# Patient Record
Sex: Male | Born: 1940
Health system: Southern US, Community
[De-identification: ages and names within clinical notes are randomized; demographics above are authoritative.]

## PROBLEM LIST (undated history)

## (undated) DIAGNOSIS — E7800 Pure hypercholesterolemia, unspecified: Secondary | ICD-10-CM

## (undated) DIAGNOSIS — I7090 Unspecified atherosclerosis: Secondary | ICD-10-CM

## (undated) DIAGNOSIS — I10 Essential (primary) hypertension: Secondary | ICD-10-CM

## (undated) DIAGNOSIS — G4730 Sleep apnea, unspecified: Secondary | ICD-10-CM

## (undated) DIAGNOSIS — M353 Polymyalgia rheumatica: Secondary | ICD-10-CM

## (undated) DIAGNOSIS — F329 Major depressive disorder, single episode, unspecified: Secondary | ICD-10-CM

## (undated) DIAGNOSIS — J439 Emphysema, unspecified: Secondary | ICD-10-CM

## (undated) DIAGNOSIS — D509 Iron deficiency anemia, unspecified: Secondary | ICD-10-CM

## (undated) HISTORY — DX: Sleep apnea, unspecified: G47.30

## (undated) HISTORY — DX: Iron deficiency anemia, unspecified: D50.9

## (undated) HISTORY — DX: Emphysema, unspecified: J43.9

## (undated) HISTORY — DX: Major depressive disorder, single episode, unspecified: F32.9

## (undated) HISTORY — DX: Polymyalgia rheumatica: M35.3

## (undated) HISTORY — DX: Unspecified atherosclerosis: I70.90

## (undated) HISTORY — PX: OTHER SURGICAL HISTORY: SHX169

---

## 2007-07-06 ENCOUNTER — Encounter (INDEPENDENT_AMBULATORY_CARE_PROVIDER_SITE_OTHER): Payer: Self-pay | Admitting: Internal Medicine

## 2007-07-06 ENCOUNTER — Inpatient Hospital Stay (HOSPITAL_COMMUNITY): Admission: EM | Admit: 2007-07-06 | Discharge: 2007-07-09 | Payer: Self-pay | Admitting: Emergency Medicine

## 2007-07-07 ENCOUNTER — Encounter (INDEPENDENT_AMBULATORY_CARE_PROVIDER_SITE_OTHER): Payer: Self-pay | Admitting: Internal Medicine

## 2007-07-09 ENCOUNTER — Encounter (INDEPENDENT_AMBULATORY_CARE_PROVIDER_SITE_OTHER): Payer: Self-pay | Admitting: Neurology

## 2007-07-09 ENCOUNTER — Encounter: Payer: Self-pay | Admitting: Internal Medicine
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
## 2010-12-10 NOTE — Discharge Summary (Signed)
NAMEMALICK, NETZ                ACCOUNT NO.:  [id number]   MEDICAL RECORD NO.:  [id number]          PATIENT TYPE:  INP   LOCATION:  1513                         FACILITY:  [facility]   PHYSICIAN:  Lucita Ferrara, MD         DATE OF BIRTH:  02-Jan-1941   DATE OF ADMISSION:  07/05/2007  DATE OF DISCHARGE:  07/09/2007                               DISCHARGE SUMMARY   ADMISSION DIAGNOSES:  1. Dizziness.  2. Loss of balance.  3. Hyperlipidemia.  4. Hypertension.  5. History of cerebrovascular accident in October 2008.   DISCHARGE DIAGNOSES:  1. Transient ischemic attack with no new neurological deficits.  2. Hyperlipidemia, chronic, stable, controlled.  3. Hypertension, chronic, stable, controlled.  4. Sleep apnea, due to get CPAP, stable.   CONSULTANTS:  Include Neurology, Dr. Kelli Hope.   PRIMARY CARE PHYSICIAN:  Dr. Reuel Boom at [facility] at [facility].   PROCEDURES:  1. The patient had a CT of the head July 05, 2007, which showed      bilateral remote thalamic lacunar infarcts, chronic.  Had a MRA/MRI      of the brain.  MRA showed negative for intracranial MR angiography      of large and medial-size vessels.  MRI of the brain, both of them      done July 05, 2007, which showed old small-vessel strokes in the      cerebellum, thalami and hemispheric white matter.  Suspect two tiny      acute strokes, one of the right cerebellar peduncle and one of the      right mid-brain.  These are not definitive but possible.  2. The patient had a MRA of the neck, which was negative.  3. The patient also had transcranial Dopplers, which were also      negative.  4. The patient also had a 2D echocardiogram, which was normal with a      normal ejection fraction.  5. The patient also had a swallowing evaluation, which showed no      oropharyngeal dysphagia.   BRIEF HISTORY OF PRESENT ILLNESS:  Mr. Pooler is a 70 year old African-  American gentleman, presented to the [facility] on  July 05, 2007, with intermittent episodes of dizziness and loss of  balance, which has subsided, but given that he had a recent cerebellar  stroke about two months ago, the patient was admitted to the  neurological team, first at [facility], then he was  transferred to [facility] after being evaluated by  neurology above.  The patient underwent the following and above tests,  which were essentially negative for any new infarction.  His  neurological physical examination did not show any focal deficits.  Physical therapy, occupational therapy and comprehensive team were  called to evaluate, and other than that the patient was recommended to  undergo risk factor modification with control of cholesterol,  hypertension.  His cholesterol showed him to have an LDL of 75.  His  Zocor was increased from 10 to 20.  His laboratory  for TIA /stroke work-  up, including RPR and hypercoagulable profile were negative.  His  hemoglobin A1c for his control was 6.3, with no previous known diagnosis  of diabetes.   EXAMINATION:  GENERAL:  On the day of discharge, the patient was  hemodynamically stable.  VITAL SIGNS:  His blood pressure is 119/74, temperature 98.2, pulse 68,  pulse ox 96% on room air.  His glucose is 110/104/113.  HEENT:  Normocephalic, atraumatic, sclerae anicteric.  NECK:  Supple, no carotid bruits.  CARDIOVASCULAR:  S1, S2, regular rate and rhythm.  No murmurs, rubs or  clicks.  ABDOMEN:  Soft, nontender, nondistended.  Positive bowel sounds.  EXTREMITIES:  No clubbing, cyanosis or edema.   The patient is able to tolerate p.o. intake without difficulty, able to  ambulate without dizziness or fall.   PLAN:  The plan for discharge followup is:  1. To control risk factors including hypertension.  High blood sugars      need to be evaluated by primary care, and also he has a clinical      history of sleep apnea.  He was supposed to have an  appointment      actually today.  He may have rescheduled that appointment.      Lucita Ferrara, MD  Electronically Signed     RR/MEDQ  D:  07/09/2007  T:  07/09/2007  Job:  [id number]   cc:   Donzetta Sprung

## 2011-01-13 ENCOUNTER — Ambulatory Visit (HOSPITAL_COMMUNITY)
Admission: RE | Admit: 2011-01-13 | Discharge: 2011-01-13 | Disposition: A | Discharge status: Home / Self Care | Payer: Medicare HMO | Source: Ambulatory Visit | Attending: Ophthalmology | Admitting: Ophthalmology

## 2011-01-13 DIAGNOSIS — I10 Essential (primary) hypertension: Secondary | ICD-10-CM | POA: Insufficient documentation

## 2011-01-13 DIAGNOSIS — H40159 Residual stage of open-angle glaucoma, unspecified eye: Secondary | ICD-10-CM | POA: Insufficient documentation

## 2011-02-22 ENCOUNTER — Inpatient Hospital Stay (HOSPITAL_COMMUNITY)
Admission: EM | Admit: 2011-02-22 | Discharge: 2011-02-24 | DRG: 694 | Disposition: A | Discharge status: Home / Self Care | Payer: Medicare HMO | Attending: Urology | Admitting: Urology

## 2011-02-22 DIAGNOSIS — R509 Fever, unspecified: Secondary | ICD-10-CM | POA: Diagnosis present

## 2011-02-22 DIAGNOSIS — Z8673 Personal history of transient ischemic attack (TIA), and cerebral infarction without residual deficits: Secondary | ICD-10-CM

## 2011-02-22 DIAGNOSIS — M109 Gout, unspecified: Secondary | ICD-10-CM | POA: Diagnosis present

## 2011-02-22 DIAGNOSIS — M1990 Unspecified osteoarthritis, unspecified site: Secondary | ICD-10-CM | POA: Diagnosis present

## 2011-02-22 DIAGNOSIS — I10 Essential (primary) hypertension: Secondary | ICD-10-CM | POA: Diagnosis present

## 2011-02-22 DIAGNOSIS — N201 Calculus of ureter: Principal | ICD-10-CM | POA: Diagnosis present

## 2011-02-22 DIAGNOSIS — E119 Type 2 diabetes mellitus without complications: Secondary | ICD-10-CM | POA: Diagnosis present

## 2011-02-22 DIAGNOSIS — G4733 Obstructive sleep apnea (adult) (pediatric): Secondary | ICD-10-CM | POA: Diagnosis present

## 2011-02-23 ENCOUNTER — Emergency Department (HOSPITAL_COMMUNITY): Payer: Medicare HMO

## 2011-02-23 LAB — DIFFERENTIAL
Basophils Relative: 0 % (ref 0–1)
Monocytes Absolute: 0.9 10*3/uL (ref 0.1–1.0)

## 2011-02-23 LAB — URINALYSIS, ROUTINE W REFLEX MICROSCOPIC
Glucose, UA: NEGATIVE mg/dL
Glucose, UA: NEGATIVE mg/dL
Hgb urine dipstick: NEGATIVE
Nitrite: NEGATIVE
Protein, ur: NEGATIVE mg/dL
Specific Gravity, Urine: 1.026 (ref 1.005–1.030)
Urobilinogen, UA: 4 mg/dL — ABNORMAL HIGH (ref 0.0–1.0)
Urobilinogen, UA: 4 mg/dL — ABNORMAL HIGH (ref 0.0–1.0)
pH: 5.5 (ref 5.0–8.0)

## 2011-02-23 LAB — COMPREHENSIVE METABOLIC PANEL
Albumin: 3.2 g/dL — ABNORMAL LOW (ref 3.5–5.2)
BUN: 14 mg/dL (ref 6–23)
Calcium: 9.5 mg/dL (ref 8.4–10.5)
Chloride: 95 mEq/L — ABNORMAL LOW (ref 96–112)
Sodium: 132 mEq/L — ABNORMAL LOW (ref 135–145)
Total Bilirubin: 1.8 mg/dL — ABNORMAL HIGH (ref 0.3–1.2)
Total Protein: 8.2 g/dL (ref 6.0–8.3)

## 2011-02-23 LAB — LIPASE, BLOOD: Lipase: 26 U/L (ref 11–59)

## 2011-02-23 LAB — CBC
Platelets: 250 10*3/uL (ref 150–400)
RBC: 4.68 MIL/uL (ref 4.22–5.81)

## 2011-02-23 LAB — URINE MICROSCOPIC-ADD ON

## 2011-02-23 NOTE — Op Note (Signed)
Jaime Whitaker, Jaime Whitaker NO.:  [id number]  MEDICAL RECORD NO.:  [id number]  LOCATION:  WLED                         FACILITY:  [facility]  PHYSICIAN:  Heloise Purpura, MD      DATE OF BIRTH:  08-Aug-1940  DATE OF PROCEDURE:  02/23/2011 DATE OF DISCHARGE:                              OPERATIVE REPORT   PREOPERATIVE DIAGNOSES: 1. Right ureteral calculus. 2. Fever.  POSTOPERATIVE DIAGNOSES: 1. Right ureteral calculus. 2. Fever.  PROCEDURES PERFORMED: 1. Cystoscopy. 2. Right retrograde pyelography with interpretation. 3. Right ureteral stent placement (6 x 26).  INTRAOPERATIVE FINDINGS:  Retrograde pyelography revealed a filling defect within the distal right ureter consistent with the stone.  There was not noted to be any significant hydronephrosis or filling defects within the ureter and no other abnormalities noted in the renal collecting system.  A urine culture had been obtained at the beginning of the procedure and was sent for culture.  SURGEON:  Heloise Purpura, M.D.  ANESTHESIA:  General.  COMPLICATIONS:  None.  INDICATION:  Jaime Whitaker is a 70 year old gentleman with a two to three- day history of severe right-sided flank and right lower quadrant pain. He has intermittently been having fever and did have an objective fever of 100.6 in the Emergency Room despite 72 hours of antibiotic therapy. Considering the concern for right ureteral obstruction and infection, it was recommended that he undergo decompression of the right kidney with ureteral stent placement.  The potential risks, complications and alternative treatment options were discussed in detail and informed consent obtained.  DESCRIPTION OF PROCEDURE:  The patient was taken to the operating room and a general anesthetic was administered.  He was given preoperative antibiotics, placed in the dorsal lithotomy position and prepped and draped in the usual sterile fashion.  Next, preoperative  time-out was performed.  Cystourethroscopy was then performed, which revealed a normal anterior and posterior urethra.  Inspection of the bladder revealed no evidence of any bladder tumors, stones or other mucosal pathology.  The ureteral orifices were in their expected locations.  The urine was noted to be orange consistent with either dehydration or possible use of phenazopyridine.  A urine culture was obtained.  A 0.038 sensor guidewire was then advanced into the right ureter pass the stone and into the right renal pelvis without difficulty.  A 6-French ureteral catheter was then advanced over the wire up into the right renal pelvis and an attempt to collect a specimen from the right upper urinary tract was attempted; however, no significant urine was returned and a culture from the right renal pelvis was unable to be collected.  A small amount of Omnipaque contrast was injected to confirm appropriate position within the renal collecting system and this did confirm location within the right renal pelvis without any evidence of dilation or hydronephrosis.  The guidewire was then replaced into the renal pelvis and the ureteral catheter removed.  The wire was back loaded ahead and back loaded onto the cystoscope and a 6 x 26 double-J ureteral stent was then advanced over the wire using Seldinger technique and positioned appropriately under fluoroscopic and cystoscopic guidance.  The wire was removed with good curl  noted in the renal pelvis as well as in the bladder.  The patient's bladder was emptied.  The procedure was ended. He tolerated the procedure well without complications.  He was able to be awakened and transferred to the recovery unit in satisfactory condition.     Heloise Purpura, MD     LB/MEDQ  D:  02/23/2011  T:  02/23/2011  Job:  [id number]  Electronically Signed by Heloise Purpura MD on 02/23/2011 11:02:01 AM
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
## 2011-02-24 LAB — DIFFERENTIAL
Basophils Absolute: 0 10*3/uL (ref 0.0–0.1)
Eosinophils Absolute: 0.1 10*3/uL (ref 0.0–0.7)
Eosinophils Relative: 1 % (ref 0–5)
Lymphocytes Relative: 21 % (ref 12–46)
Monocytes Absolute: 1.1 10*3/uL — ABNORMAL HIGH (ref 0.1–1.0)
Monocytes Relative: 10 % (ref 3–12)

## 2011-02-24 LAB — CBC
Hemoglobin: 11.8 g/dL — ABNORMAL LOW (ref 13.0–17.0)
MCHC: 33.2 g/dL (ref 30.0–36.0)
MCV: 88.3 fL (ref 78.0–100.0)
Platelets: 257 10*3/uL (ref 150–400)
RBC: 4.02 MIL/uL — ABNORMAL LOW (ref 4.22–5.81)
WBC: 10.2 10*3/uL (ref 4.0–10.5)

## 2011-02-24 LAB — URINE CULTURE

## 2011-02-24 LAB — BASIC METABOLIC PANEL
Chloride: 103 mEq/L (ref 96–112)
Creatinine, Ser: 1.47 mg/dL — ABNORMAL HIGH (ref 0.50–1.35)
GFR calc non Af Amer: 48 mL/min — ABNORMAL LOW (ref >60)

## 2011-02-25 NOTE — Discharge Summary (Signed)
  NAMETHADDAEUS, Jaime Whitaker NO.:  [id number]  MEDICAL RECORD NO.:  [id number]  LOCATION:  1523                         FACILITY:  [facility]  PHYSICIAN:  Heloise Purpura, MD      DATE OF BIRTH:  1940/09/15  DATE OF ADMISSION:  02/22/2011 DATE OF DISCHARGE:  02/24/2011                              DISCHARGE SUMMARY   REASON FOR ADMISSION: 1. Right ureteral calculus. 2. Fever.  DISCHARGE DIAGNOSES: 1. Right ureteral calculus. 2. Fever.  SECONDARY DIAGNOSIS:  Diabetes.  HISTORY:  Jaime Whitaker is a 70 year old gentleman who developed right- sided flank pain along with right lower quadrant pain.  He also was having some voiding symptoms and presented to his primary care physician earlier this week and was diagnosed with a urinary tract infection.  He was begun on empiric antibiotic therapy but had worsening pain on February 22, 2011, which brought him into the emergency room late that evening. He underwent a CT scan which demonstrated a small right ureteral calculus.  However, he was intermittently having subjective fever and was noted to be objectively febrile at 100.6 in the emergency department.  HOSPITAL COURSE:  Based on the concern for ureteral obstruction and infection, he was taken to the operating room and underwent cystoscopy and right ureteral stent placement.  This procedure was uncomplicated and without significant unusual findings.  He was then admitted to the hospital for observation and a urine culture had been obtained intraoperatively.  This subsequently returned with no growth, likely a result of the patient having already been on antibiotics prior to presenting to the hospital.  He remained afebrile with stable vital signs throughout his hospitalization and he was felt to be stable for discharge home on February 24, 2011.  He was placed on empiric antibiotic therapy with ciprofloxacin 500 mg p.o. b.i.d. and was instructed to take these antibiotics for 2  weeks as an outpatient.  He will then plan to follow up for definitive surgical treatment of his kidney stone in approximately 2 to 3 weeks following treatment of his probable pyelonephritis.  DISPOSITION:  Home.  DISCHARGE MEDICATIONS:  He will resume all of his regular home medications.  He was also given a prescription to take ciprofloxacin 500 mg p.o. b.i.d..  He also will take Vicodin as needed for pain.  DISCHARGE INSTRUCTIONS:  He has been instructed to resume his diet as before surgery and has no activity restrictions.  FOLLOWUP:  He will be scheduled to undergo cystoscopy, right retrograde pyelography, right ureteroscopy with possible laser lithotripsy and stone removal as well as possible right ureteral stent placement.  I have reviewed the potential risks, complications, and alternative treatment options associated with this approach and he does give his informed consent to proceed.  This will be scheduled following his antibiotic course.     Heloise Purpura, MD     LB/MEDQ  D:  02/24/2011  T:  02/24/2011  Job:  [id number]  cc:   Dr. Karrie Doffing  Electronically Signed by Heloise Purpura MD on 02/25/2011 09:36:07 PM

## 2011-03-21 ENCOUNTER — Other Ambulatory Visit: Payer: Self-pay | Admitting: Urology

## 2011-03-21 ENCOUNTER — Encounter (HOSPITAL_COMMUNITY): Payer: Medicare HMO

## 2011-03-21 LAB — CBC
HCT: 39.1 % (ref 39.0–52.0)
Hemoglobin: 13 g/dL (ref 13.0–17.0)
MCV: 88.1 fL (ref 78.0–100.0)
Platelets: 202 10*3/uL (ref 150–400)

## 2011-03-21 LAB — BASIC METABOLIC PANEL
BUN: 11 mg/dL (ref 6–23)
CO2: 28 mEq/L (ref 19–32)
Calcium: 9.7 mg/dL (ref 8.4–10.5)
GFR calc Af Amer: >60 mL/min (ref >60)
Glucose, Bld: 99 mg/dL (ref 70–99)
Sodium: 139 mEq/L (ref 135–145)

## 2011-03-21 LAB — SURGICAL PCR SCREEN: MRSA, PCR: NEGATIVE

## 2011-03-27 ENCOUNTER — Ambulatory Visit (HOSPITAL_COMMUNITY)
Admission: RE | Admit: 2011-03-27 | Discharge: 2011-03-27 | Disposition: A | Discharge status: Home / Self Care | Payer: Medicare HMO | Source: Ambulatory Visit | Attending: Urology | Admitting: Urology

## 2011-03-27 DIAGNOSIS — N201 Calculus of ureter: Secondary | ICD-10-CM | POA: Insufficient documentation

## 2011-03-27 DIAGNOSIS — I69928 Other speech and language deficits following unspecified cerebrovascular disease: Secondary | ICD-10-CM | POA: Insufficient documentation

## 2011-03-27 DIAGNOSIS — R29898 Other symptoms and signs involving the musculoskeletal system: Secondary | ICD-10-CM | POA: Insufficient documentation

## 2011-03-27 DIAGNOSIS — E669 Obesity, unspecified: Secondary | ICD-10-CM | POA: Insufficient documentation

## 2011-03-27 DIAGNOSIS — I69998 Other sequelae following unspecified cerebrovascular disease: Secondary | ICD-10-CM | POA: Insufficient documentation

## 2011-03-27 DIAGNOSIS — I10 Essential (primary) hypertension: Secondary | ICD-10-CM | POA: Insufficient documentation

## 2011-03-27 DIAGNOSIS — F17200 Nicotine dependence, unspecified, uncomplicated: Secondary | ICD-10-CM | POA: Insufficient documentation

## 2011-03-27 DIAGNOSIS — Z01812 Encounter for preprocedural laboratory examination: Secondary | ICD-10-CM | POA: Insufficient documentation

## 2011-03-29 NOTE — Op Note (Signed)
  NAMERODRIC, PUNCH NO.:  [id number]  MEDICAL RECORD NO.:  [id number]  LOCATION:  DAYL                         FACILITY:  [facility]  PHYSICIAN:  Heloise Purpura, MD      DATE OF BIRTH:  Sep 17, 1940  DATE OF PROCEDURE:  03/27/2011 DATE OF DISCHARGE:  03/27/2011                              OPERATIVE REPORT   PREOPERATIVE DIAGNOSIS:  Right ureteral calculus.  POSTOPERATIVE DIAGNOSIS:  Right ureteral calculus.  PROCEDURE: 1. Cystoscopy. 2. Right ureteroscopy with stone removal.  SURGEON:  Heloise Purpura, MD  ANESTHESIA:  General.  COMPLICATIONS:  None.  ESTIMATED BLOOD LOSS:  Minimal.  INDICATIONS:  Mr. Spengler is a 70 year old gentleman who recently presented to the hospital with an obstructing right ureteral stone and fever. He underwent temporizing right ureteral stent placement and treatment with antibiotics.  He presents today for definitive treatment of the stone and after reviewing options, he did elect to proceed with ureteroscopic removal.  The potential risks, complications, and alternative treatment options were discussed in detail and informed consent was obtained.  DESCRIPTION OF PROCEDURE:  The patient was taken to the operating room and a general anesthetic was administered.  He was given preoperative antibiotics, placed in the dorsal lithotomy position, and prepped and draped in the usual sterile fashion.  Next, a preoperative time-out was performed.  Cystourethroscopy was then performed, which revealed a normal anterior and posterior urethra.  Inspection of the bladder revealed an indwelling right ureteral stent and no other abnormalities were noted within the bladder.  Specifically, no bladder tumors, stones, or other mucosal pathology was seen.  The right ureteral stent was then brought out through the urethral meatus with the aid of the flexible graspers and a 0.038 sensor guidewire was advanced up past the stone into the right renal  pelvis under fluoroscopic guidance.  A 6-French semi-rigid ureteroscope was then advanced next to the wire into the most distal ureter where the patient's stone was identified.  Due to fact that his ureter was well dilated considering his indwelling stent, it was felt that the stone would be able to be removed without lithotripsy. Therefore, a Zero Tip nitinol basket was used to extract the stone without difficulty.  Due to the atraumatic nature of his procedure, it was felt that he would need continued ureteral stent drainage and therefore the guidewire was removed after reinspection with ureteroscopy revealed no further fragments.  The patient's bladder was then emptied and the procedure ended.  He tolerated the procedure well without complications.  He was able to be awakened and transferred to the recovery unit in satisfactory condition.     Heloise Purpura, MD     LB/MEDQ  D:  03/27/2011  T:  03/27/2011  Job:  [id number]  Electronically Signed by Heloise Purpura MD on 03/29/2011 05:20:41 AM
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
## 2011-05-05 LAB — BASIC METABOLIC PANEL
BUN: 10
CO2: 28
CO2: 31
Chloride: 100
Creatinine, Ser: 1.09
Creatinine, Ser: 1.27
GFR calc Af Amer: >60
Potassium: 3.7
Potassium: 3.7
Sodium: 138

## 2011-05-05 LAB — RPR: RPR Ser Ql: NONREACTIVE

## 2011-05-05 LAB — CBC
HCT: 42.7
Hemoglobin: 14.4
MCHC: 33.7
MCHC: 33.9
MCV: 90
MCV: 90.1
RBC: 4.73
RDW: 13.7
WBC: 6.2

## 2011-05-05 LAB — PROTEIN C ACTIVITY: Protein C Activity: 183 % — ABNORMAL HIGH (ref 75–133)

## 2011-05-05 LAB — CARDIOLIPIN ANTIBODIES, IGG, IGM, IGA
Anticardiolipin IgG: <7 — ABNORMAL LOW (ref <11)
Anticardiolipin IgM: <7 — ABNORMAL LOW (ref <10)

## 2011-05-05 LAB — URINALYSIS, ROUTINE W REFLEX MICROSCOPIC
Bilirubin Urine: NEGATIVE
Nitrite: NEGATIVE
Protein, ur: NEGATIVE
Specific Gravity, Urine: 1.029
Urobilinogen, UA: 1

## 2011-05-05 LAB — LIPID PANEL
Cholesterol: 138
LDL Cholesterol: 75
Triglycerides: 172 — ABNORMAL HIGH

## 2011-05-05 LAB — DIFFERENTIAL
Basophils Absolute: 0
Basophils Relative: 0
Eosinophils Absolute: 0.2
Neutro Abs: 2.8
Neutrophils Relative %: 54

## 2011-05-05 LAB — MAGNESIUM: Magnesium: 1.9

## 2011-05-05 LAB — PROTHROMBIN GENE MUTATION

## 2011-05-05 LAB — URINE MICROSCOPIC-ADD ON

## 2011-05-05 LAB — LUPUS ANTICOAGULANT PANEL
DRVVT: 35.2 — ABNORMAL LOW (ref 36.1–47.0)
Lupus Anticoagulant: NOT DETECTED

## 2011-05-05 LAB — HOMOCYSTEINE: Homocysteine: 15.2

## 2011-05-05 LAB — POCT CARDIAC MARKERS
Myoglobin, poc: 77.3
Operator id: 4661
Troponin i, poc: <0.05

## 2011-05-05 LAB — PROTEIN S ACTIVITY: Protein S Activity: 107 % (ref 69–129)

## 2011-05-05 LAB — BETA-2-GLYCOPROTEIN I ABS, IGG/M/A: Beta-2 Glyco I IgG: 16 U/mL (ref <20)

## 2011-05-05 LAB — FACTOR 5 LEIDEN

## 2012-05-06 ENCOUNTER — Emergency Department (HOSPITAL_COMMUNITY)
Admission: EM | Admit: 2012-05-06 | Discharge: 2012-05-06 | Disposition: A | Discharge status: Home / Self Care | Payer: Medicare HMO | Attending: Emergency Medicine | Admitting: Emergency Medicine

## 2012-05-06 ENCOUNTER — Encounter (HOSPITAL_COMMUNITY): Payer: Self-pay | Admitting: *Deleted

## 2012-05-06 DIAGNOSIS — E7800 Pure hypercholesterolemia, unspecified: Secondary | ICD-10-CM | POA: Insufficient documentation

## 2012-05-06 DIAGNOSIS — F17200 Nicotine dependence, unspecified, uncomplicated: Secondary | ICD-10-CM | POA: Insufficient documentation

## 2012-05-06 DIAGNOSIS — I10 Essential (primary) hypertension: Secondary | ICD-10-CM | POA: Insufficient documentation

## 2012-05-06 DIAGNOSIS — N200 Calculus of kidney: Secondary | ICD-10-CM | POA: Insufficient documentation

## 2012-05-06 DIAGNOSIS — Z7982 Long term (current) use of aspirin: Secondary | ICD-10-CM | POA: Insufficient documentation

## 2012-05-06 HISTORY — DX: Pure hypercholesterolemia, unspecified: E78.00

## 2012-05-06 HISTORY — DX: Essential (primary) hypertension: I10

## 2012-05-06 LAB — URINALYSIS, ROUTINE W REFLEX MICROSCOPIC
Nitrite: NEGATIVE
Specific Gravity, Urine: 1.005 (ref 1.005–1.030)
Urobilinogen, UA: 0.2 mg/dL (ref 0.0–1.0)
pH: 6 (ref 5.0–8.0)

## 2012-05-06 LAB — URINE MICROSCOPIC-ADD ON

## 2012-05-06 MED ORDER — IBUPROFEN 600 MG PO TABS: 600.0000 mg | ORAL_TABLET | Freq: Four times a day (QID) | ORAL | Status: DC | PRN

## 2012-05-06 MED ORDER — KETOROLAC TROMETHAMINE 30 MG/ML IJ SOLN
30.0000 mg | Freq: Once | INTRAMUSCULAR | Status: AC
  Administered 2012-05-06: 30 mg via INTRAMUSCULAR
  Filled 2012-05-06: qty 1

## 2012-05-06 NOTE — ED Provider Notes (Signed)
History     CSN: [id number]  Arrival date & time 05/06/12  1335   First MD Initiated Contact with Patient 05/06/12 1612      Chief Complaint  Patient presents with  . Flank Pain     HPI Patient was seen yesterday at [facility] with left-sided flank and testicular pain.  Emergency department workup in included CT of the abdomen along with ultrasound of the testicles.  Patient was discovered to have a kidney stone and some diverticulitis.  Patient has an appointment to see Dr. tomorrow.  He had some pain at home.  Came in here because of pain.  He only took one 5 mg oxycodone.  He says his pain is much better after that.  Otherwise patient denies any fever or vomiting. Past Medical History  Diagnosis Date  . Hypertension   . Hypercholesteremia     History reviewed. No pertinent past surgical history.  No family history on file.  History  Substance Use Topics  . Smoking status: Current Every Day Smoker    Types: Cigarettes  . Smokeless tobacco: Not on file  . Alcohol Use: No      Review of Systems  All other systems reviewed and are negative.    Allergies  Review of patient's allergies indicates no known allergies.  Home Medications   Current Outpatient Rx  Name Route Sig Dispense Refill  . ASPIRIN 81 MG PO TABS Oral Take 81 mg by mouth daily.    . CHOLECALCIFEROL 400 UNITS PO TABS Oral Take 400 Units by mouth.    [name] CIPROFLOXACIN HCL 500 MG PO TABS Oral Take 500 mg by mouth 2 (two) times daily.    [name] LOVASTATIN 20 MG PO TABS Oral Take 20 mg by mouth at bedtime.    [name] METOPROLOL TARTRATE 50 MG PO TABS Oral Take 50 mg by mouth 2 (two) times daily.    [name] METRONIDAZOLE 500 MG PO TABS Oral Take 500 mg by mouth 3 (three) times daily.    . OXYCODONE-ACETAMINOPHEN 5-325 MG PO TABS Oral Take 1 tablet by mouth every 4 (four) hours as needed. Pain    . IBUPROFEN 600 MG PO TABS Oral Take 1 tablet (600 mg total) by mouth every 6 (six) hours as needed for pain. 30 tablet 0      BP 141/70  Pulse 52  Temp 98.2 F (36.8 C) (Oral)  Resp 16  SpO2 98%  Physical Exam  Nursing note and vitals reviewed. Constitutional: He is oriented to person, place, and time. He appears well-developed. No distress.  HENT:  Head: Normocephalic and atraumatic.  Eyes: Pupils are equal, round, and reactive to light.  Neck: Normal range of motion.  Cardiovascular: Normal rate and intact distal pulses.   Pulmonary/Chest: No respiratory distress.  Abdominal: Normal appearance. He exhibits no distension. There is no tenderness. There is no rebound and no guarding. Hernia confirmed negative in the right inguinal area and confirmed negative in the left inguinal area.  Musculoskeletal: Normal range of motion.  Neurological: He is alert and oriented to person, place, and time. No cranial nerve deficit.  Skin: Skin is warm and dry. No rash noted.  Psychiatric: He has a normal mood and affect. His behavior is normal.    ED Course  Procedures (including critical care time)  Medications  ciprofloxacin (CIPRO) 500 MG tablet (not administered)  oxyCODONE-acetaminophen (PERCOCET/ROXICET) 5-325 MG per tablet (not administered)  metoprolol (LOPRESSOR) 50 MG tablet (not administered)  metroNIDAZOLE (FLAGYL) 500  MG tablet (not administered)  aspirin 81 MG tablet (not administered)  lovastatin (MEVACOR) 20 MG tablet (not administered)  cholecalciferol (VITAMIN D) 400 UNITS TABS (not administered)  ketorolac (TORADOL) 30 MG/ML injection 30 mg (not administered)  ibuprofen (ADVIL,MOTRIN) 600 MG tablet (not administered)    Labs Reviewed  URINALYSIS, ROUTINE W REFLEX MICROSCOPIC - Abnormal; Notable for the following:    Hgb urine dipstick TRACE (*)     All other components within normal limits  URINE MICROSCOPIC-ADD ON - Abnormal; Notable for the following:    Squamous Epithelial / LPF FEW (*)     All other components within normal limits   No results found.   1. Kidney stone        MDM  Plan is to add an inset to his pain management along with increasing his oxycodone intake.  Patient will followup with the doctor as scheduled tomorrow       Nelia Shi, MD 05/06/12 [phone]

## 2012-05-06 NOTE — ED Notes (Signed)
Pt with sharp pain in his left groin area.  Pt went to [facility] yesterday and they gave him something for nausea.  Patient vomiting yesterday but not today and denies nausea now.  Pt has history of kidney stones.

## 2012-05-06 NOTE — ED Notes (Signed)
Pt reports L flank pain which started yesterday with n/v, denies any n/v today.  Pt with hx of same.

## 2013-04-13 IMAGING — CT CT ABD-PELV W/O CM
2 of 4 series · 17 of 46 positions shown, 19 images · non-contrast
Comparison: None.

CLINICAL DATA: Right-sided flank pain

CT ABDOMEN AND PELVIS WITHOUT CONTRAST
TECHNIQUE: Multidetector CT imaging of the abdomen and pelvis was
performed following the standard protocol without intravenous
contrast.

[Series 2: stone_wo 5.0 b40f st · axial · 0.84mm/px · z∈[-494,-34]mm · 14 of 100 slices shown, 16 images]
[im 4/100  soft-tissue]
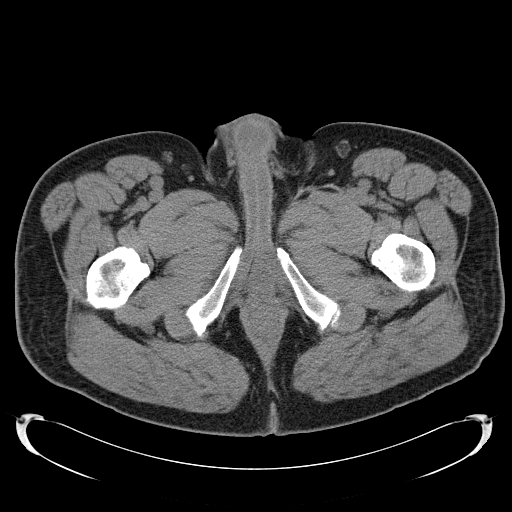
[im 4/100  bone]
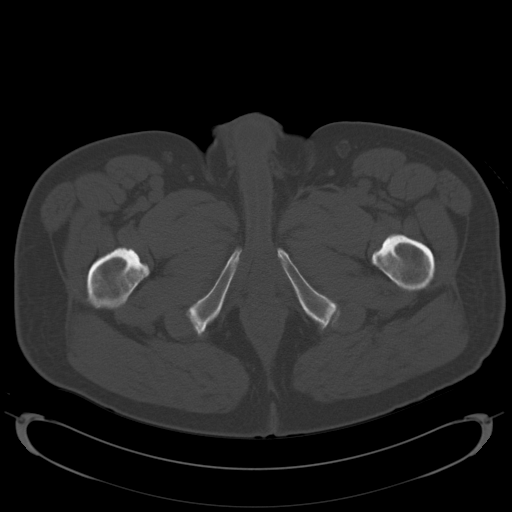
[im 12/100  soft-tissue]
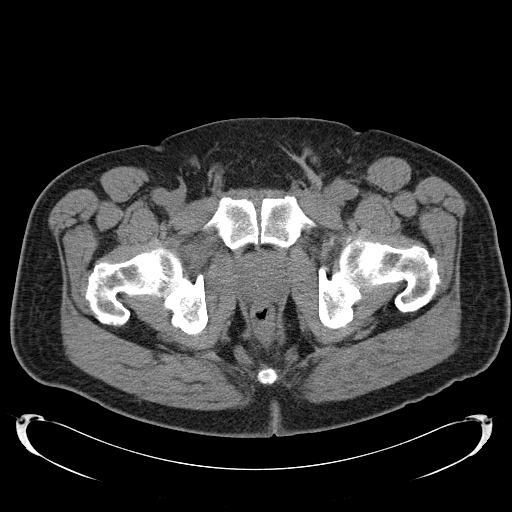
[im 20/100  soft-tissue]
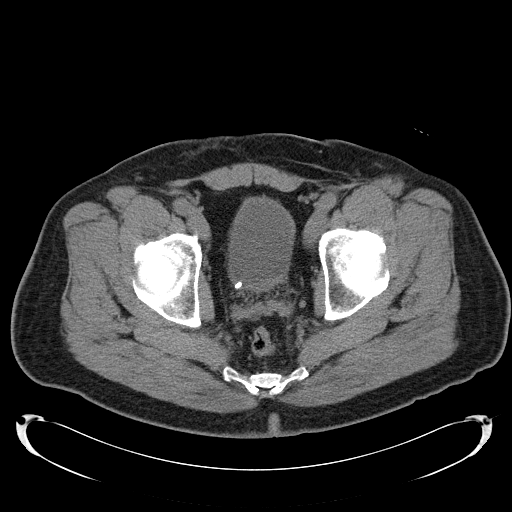
[im 28/100  soft-tissue]
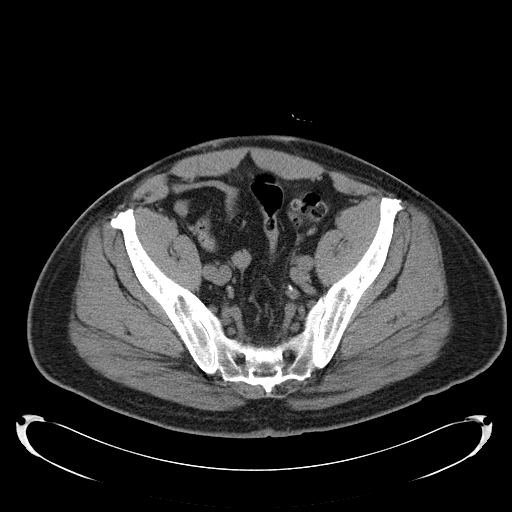
[im 32/100  soft-tissue]
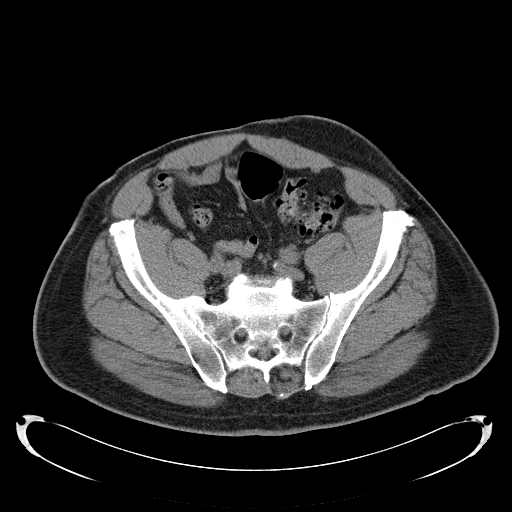
[im 40/100  soft-tissue]
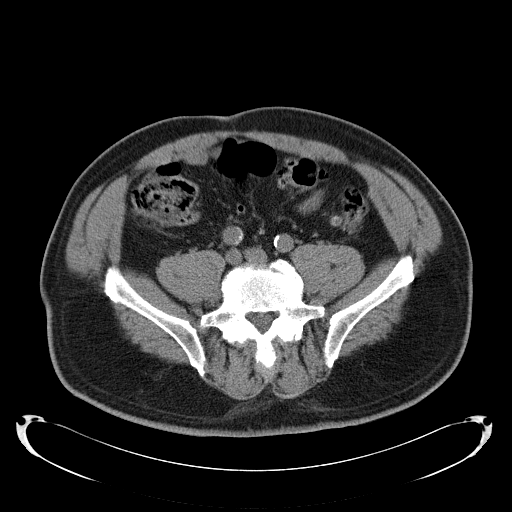
[im 48/100  soft-tissue]
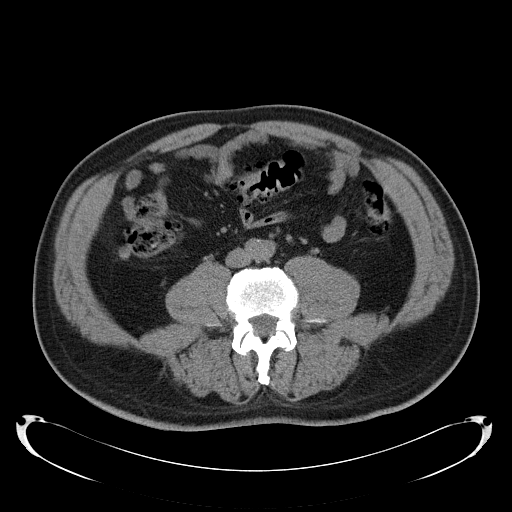
[im 52/100  soft-tissue]
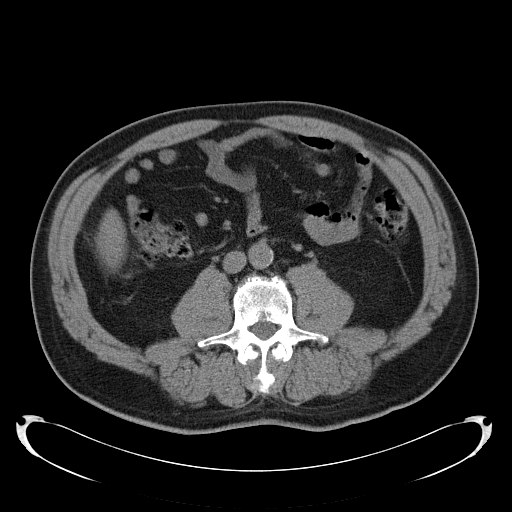
[im 60/100  soft-tissue]
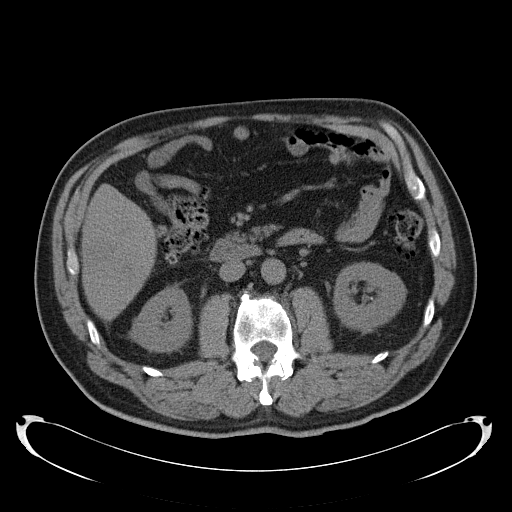
[im 60/100  bone]
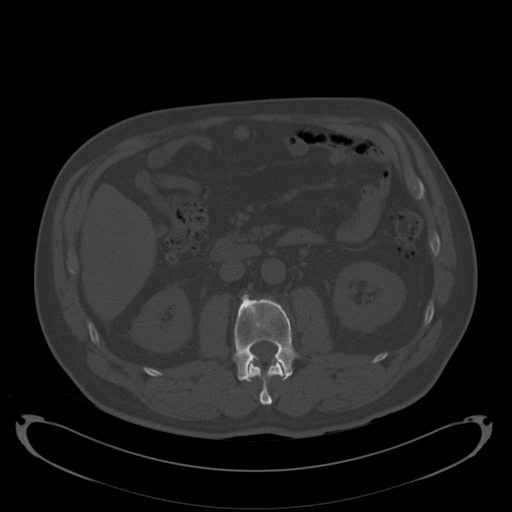
[im 68/100  soft-tissue]
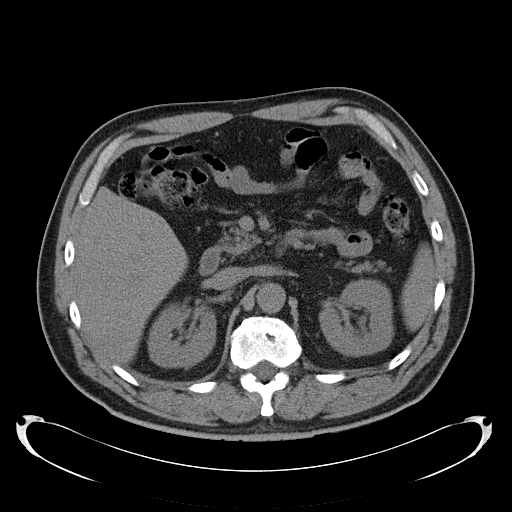
[im 76/100  soft-tissue]
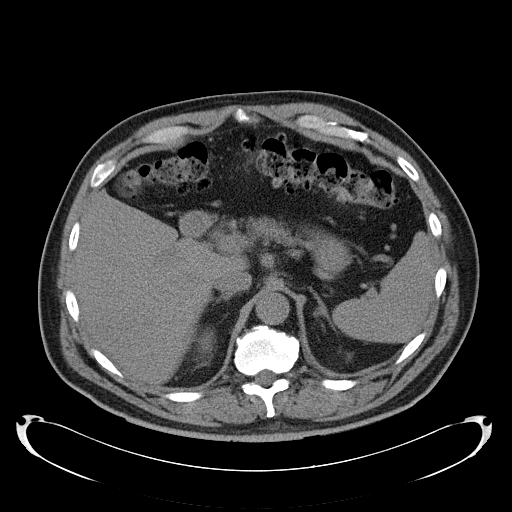
[im 80/100  soft-tissue]
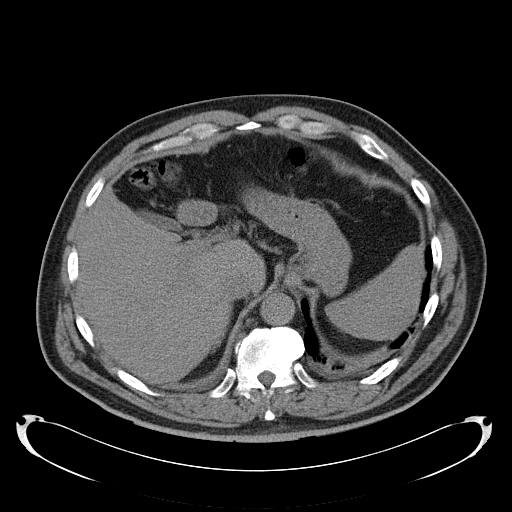
[im 88/100  soft-tissue]
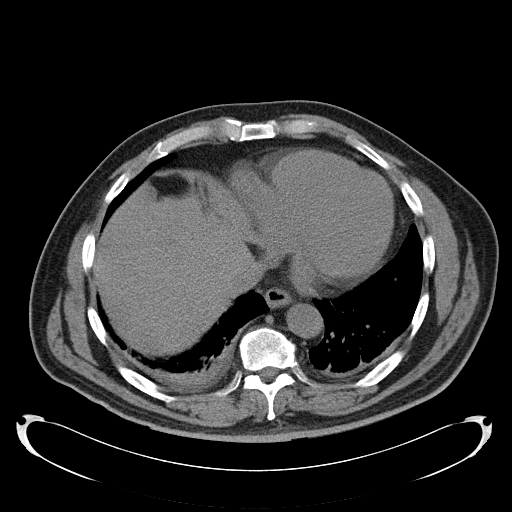
[im 96/100  soft-tissue]
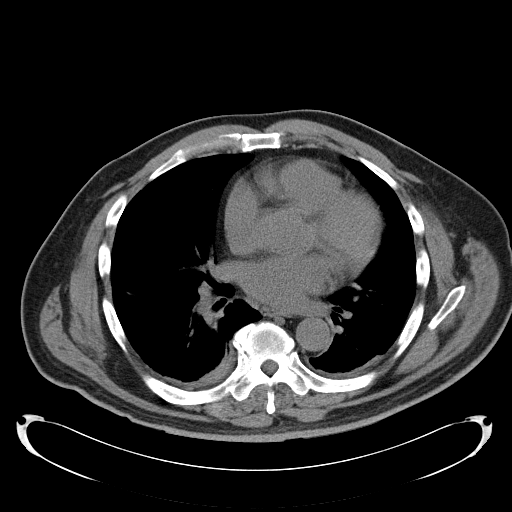

[Series 602: coronal abdomen · coronal · 1.01mm/px · 3 of 136 slices shown]
[im 46/136  soft-tissue]
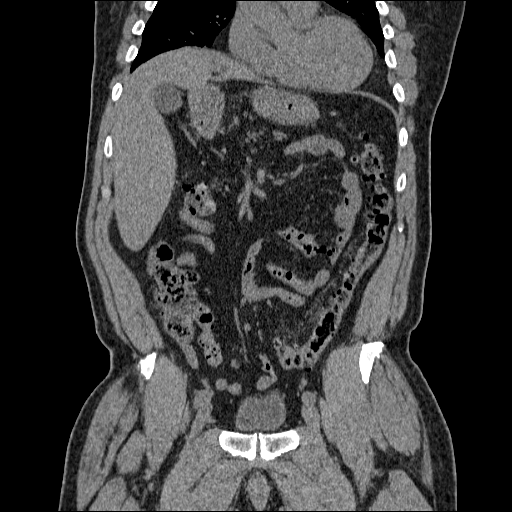
[im 61/136  soft-tissue]
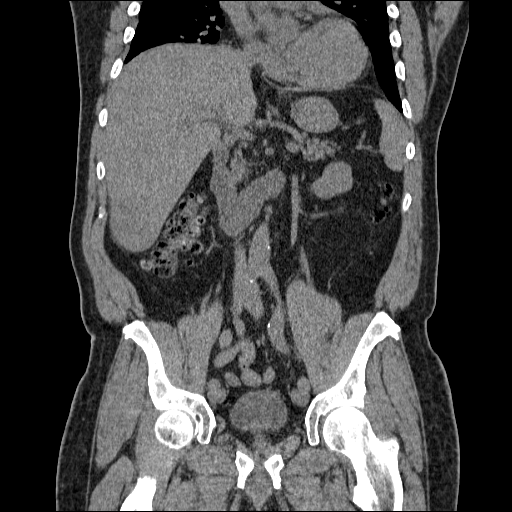
[im 76/136  soft-tissue]
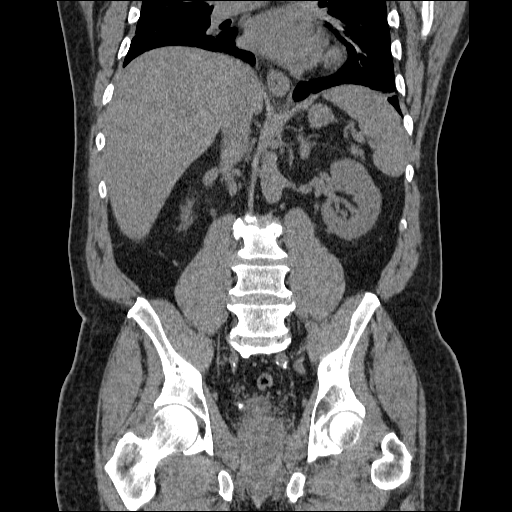

[17 of 46 positions shown; findings below may reference images not displayed]

FINDINGS: Bibasilar dependent atelectasis and trace effusions
noted.  Mild central bibasilar bronchial wall thickening.

The left hepatic lobe is absent.  Gallbladder and right hepatic
lobe are normal in appearance.  1 mm nonobstructing left mid renal
calculus image 40.  2 mm nonobstructing left lower renal pole
calculus image 42.  Spleen and pancreas are normal.  Minimal
nonspecific perinephric stranding is identified, without focal
fluid collection.

6 mm right ureterovesicular junction calculus is noted.  The
bladder is normal.  No ascites or lymphadenopathy.  Scattered
atherosclerotic aortic calcification without aneurysm.

Appendix and unenhanced bowel are normal.  Probable postoperative
change from prior left inguinal herniorrhaphy. Scattered colonic
diverticuli noted without evidence for diverticulitis.  No free air
or fluid.

Degenerative change noted in the spine.  Surgical clip noted from
possible previous left L5 laminotomy.
IMPRESSION: 6 mm right ureterovesicular junction calculus without
hydroureteronephrosis.

Nonobstructing 1 and 2 mm left renal calculi.

## 2014-11-01 DIAGNOSIS — E782 Mixed hyperlipidemia: Secondary | ICD-10-CM | POA: Diagnosis not present

## 2014-11-01 DIAGNOSIS — E6609 Other obesity due to excess calories: Secondary | ICD-10-CM | POA: Diagnosis not present

## 2014-11-01 DIAGNOSIS — Z720 Tobacco use: Secondary | ICD-10-CM | POA: Diagnosis not present

## 2014-11-01 DIAGNOSIS — Z0001 Encounter for general adult medical examination with abnormal findings: Secondary | ICD-10-CM | POA: Diagnosis not present

## 2014-11-01 DIAGNOSIS — G4733 Obstructive sleep apnea (adult) (pediatric): Secondary | ICD-10-CM | POA: Diagnosis not present

## 2014-11-01 DIAGNOSIS — I10 Essential (primary) hypertension: Secondary | ICD-10-CM | POA: Diagnosis not present

## 2015-02-14 DIAGNOSIS — S7002XA Contusion of left hip, initial encounter: Secondary | ICD-10-CM | POA: Diagnosis not present

## 2015-10-03 DIAGNOSIS — S199XXA Unspecified injury of neck, initial encounter: Secondary | ICD-10-CM | POA: Diagnosis not present

## 2015-10-03 DIAGNOSIS — Z79899 Other long term (current) drug therapy: Secondary | ICD-10-CM | POA: Diagnosis not present

## 2015-10-03 DIAGNOSIS — S39012A Strain of muscle, fascia and tendon of lower back, initial encounter: Secondary | ICD-10-CM | POA: Diagnosis not present

## 2015-10-03 DIAGNOSIS — M545 Low back pain: Secondary | ICD-10-CM | POA: Diagnosis not present

## 2015-10-03 DIAGNOSIS — I10 Essential (primary) hypertension: Secondary | ICD-10-CM | POA: Diagnosis not present

## 2015-10-03 DIAGNOSIS — S161XXA Strain of muscle, fascia and tendon at neck level, initial encounter: Secondary | ICD-10-CM | POA: Diagnosis not present

## 2015-10-03 DIAGNOSIS — M542 Cervicalgia: Secondary | ICD-10-CM | POA: Diagnosis not present

## 2015-10-03 DIAGNOSIS — S3992XA Unspecified injury of lower back, initial encounter: Secondary | ICD-10-CM | POA: Diagnosis not present

## 2015-10-03 DIAGNOSIS — F17200 Nicotine dependence, unspecified, uncomplicated: Secondary | ICD-10-CM | POA: Diagnosis not present

## 2015-10-10 DIAGNOSIS — F17200 Nicotine dependence, unspecified, uncomplicated: Secondary | ICD-10-CM | POA: Diagnosis not present

## 2015-10-10 DIAGNOSIS — S161XXD Strain of muscle, fascia and tendon at neck level, subsequent encounter: Secondary | ICD-10-CM | POA: Diagnosis not present

## 2015-10-10 DIAGNOSIS — J209 Acute bronchitis, unspecified: Secondary | ICD-10-CM | POA: Diagnosis not present

## 2015-10-10 DIAGNOSIS — S39012D Strain of muscle, fascia and tendon of lower back, subsequent encounter: Secondary | ICD-10-CM | POA: Diagnosis not present

## 2015-11-17 DIAGNOSIS — M545 Low back pain: Secondary | ICD-10-CM | POA: Diagnosis not present

## 2015-11-17 DIAGNOSIS — S161XXA Strain of muscle, fascia and tendon at neck level, initial encounter: Secondary | ICD-10-CM | POA: Diagnosis not present

## 2016-01-12 DIAGNOSIS — S161XXD Strain of muscle, fascia and tendon at neck level, subsequent encounter: Secondary | ICD-10-CM | POA: Diagnosis not present

## 2016-01-12 DIAGNOSIS — M545 Low back pain: Secondary | ICD-10-CM | POA: Diagnosis not present

## 2016-02-14 DIAGNOSIS — H401123 Primary open-angle glaucoma, left eye, severe stage: Secondary | ICD-10-CM | POA: Diagnosis not present

## 2016-02-14 DIAGNOSIS — H401122 Primary open-angle glaucoma, left eye, moderate stage: Secondary | ICD-10-CM | POA: Diagnosis not present

## 2016-02-28 DIAGNOSIS — H401122 Primary open-angle glaucoma, left eye, moderate stage: Secondary | ICD-10-CM | POA: Diagnosis not present

## 2016-04-10 DIAGNOSIS — R5382 Chronic fatigue, unspecified: Secondary | ICD-10-CM | POA: Diagnosis not present

## 2016-04-10 DIAGNOSIS — E782 Mixed hyperlipidemia: Secondary | ICD-10-CM | POA: Diagnosis not present

## 2016-04-10 DIAGNOSIS — I10 Essential (primary) hypertension: Secondary | ICD-10-CM | POA: Diagnosis not present

## 2016-04-10 DIAGNOSIS — E6609 Other obesity due to excess calories: Secondary | ICD-10-CM | POA: Diagnosis not present

## 2016-04-10 DIAGNOSIS — G4733 Obstructive sleep apnea (adult) (pediatric): Secondary | ICD-10-CM | POA: Diagnosis not present

## 2016-04-21 DIAGNOSIS — Z6836 Body mass index (BMI) 36.0-36.9, adult: Secondary | ICD-10-CM | POA: Diagnosis not present

## 2016-04-21 DIAGNOSIS — E782 Mixed hyperlipidemia: Secondary | ICD-10-CM | POA: Diagnosis not present

## 2016-04-21 DIAGNOSIS — E6609 Other obesity due to excess calories: Secondary | ICD-10-CM | POA: Diagnosis not present

## 2016-04-21 DIAGNOSIS — Z720 Tobacco use: Secondary | ICD-10-CM | POA: Diagnosis not present

## 2016-04-21 DIAGNOSIS — I10 Essential (primary) hypertension: Secondary | ICD-10-CM | POA: Diagnosis not present

## 2016-04-21 DIAGNOSIS — G4733 Obstructive sleep apnea (adult) (pediatric): Secondary | ICD-10-CM | POA: Diagnosis not present

## 2016-04-21 DIAGNOSIS — Z23 Encounter for immunization: Secondary | ICD-10-CM | POA: Diagnosis not present

## 2016-07-24 DIAGNOSIS — Z23 Encounter for immunization: Secondary | ICD-10-CM | POA: Diagnosis not present

## 2016-07-24 DIAGNOSIS — I10 Essential (primary) hypertension: Secondary | ICD-10-CM | POA: Diagnosis not present

## 2016-07-24 DIAGNOSIS — E782 Mixed hyperlipidemia: Secondary | ICD-10-CM | POA: Diagnosis not present

## 2016-07-24 DIAGNOSIS — Z6835 Body mass index (BMI) 35.0-35.9, adult: Secondary | ICD-10-CM | POA: Diagnosis not present

## 2016-07-24 DIAGNOSIS — Z6836 Body mass index (BMI) 36.0-36.9, adult: Secondary | ICD-10-CM | POA: Diagnosis not present

## 2016-07-24 DIAGNOSIS — G4733 Obstructive sleep apnea (adult) (pediatric): Secondary | ICD-10-CM | POA: Diagnosis not present

## 2016-07-24 DIAGNOSIS — Z720 Tobacco use: Secondary | ICD-10-CM | POA: Diagnosis not present

## 2016-07-24 DIAGNOSIS — E6609 Other obesity due to excess calories: Secondary | ICD-10-CM | POA: Diagnosis not present

## 2016-11-18 DIAGNOSIS — H40023 Open angle with borderline findings, high risk, bilateral: Secondary | ICD-10-CM | POA: Diagnosis not present

## 2016-11-20 DIAGNOSIS — E782 Mixed hyperlipidemia: Secondary | ICD-10-CM | POA: Diagnosis not present

## 2016-11-20 DIAGNOSIS — Z9189 Other specified personal risk factors, not elsewhere classified: Secondary | ICD-10-CM | POA: Diagnosis not present

## 2016-11-20 DIAGNOSIS — I10 Essential (primary) hypertension: Secondary | ICD-10-CM | POA: Diagnosis not present

## 2016-11-20 DIAGNOSIS — G4733 Obstructive sleep apnea (adult) (pediatric): Secondary | ICD-10-CM | POA: Diagnosis not present

## 2016-11-25 DIAGNOSIS — E6609 Other obesity due to excess calories: Secondary | ICD-10-CM | POA: Diagnosis not present

## 2016-11-25 DIAGNOSIS — G4733 Obstructive sleep apnea (adult) (pediatric): Secondary | ICD-10-CM | POA: Diagnosis not present

## 2016-11-25 DIAGNOSIS — E782 Mixed hyperlipidemia: Secondary | ICD-10-CM | POA: Diagnosis not present

## 2016-11-25 DIAGNOSIS — I10 Essential (primary) hypertension: Secondary | ICD-10-CM | POA: Diagnosis not present

## 2016-11-25 DIAGNOSIS — Z720 Tobacco use: Secondary | ICD-10-CM | POA: Diagnosis not present

## 2016-11-25 DIAGNOSIS — F17210 Nicotine dependence, cigarettes, uncomplicated: Secondary | ICD-10-CM | POA: Diagnosis not present

## 2016-11-25 DIAGNOSIS — Z6835 Body mass index (BMI) 35.0-35.9, adult: Secondary | ICD-10-CM | POA: Diagnosis not present

## 2017-01-12 DIAGNOSIS — R42 Dizziness and giddiness: Secondary | ICD-10-CM | POA: Diagnosis not present

## 2017-01-12 DIAGNOSIS — Z6834 Body mass index (BMI) 34.0-34.9, adult: Secondary | ICD-10-CM | POA: Diagnosis not present

## 2017-01-19 DIAGNOSIS — H401131 Primary open-angle glaucoma, bilateral, mild stage: Secondary | ICD-10-CM | POA: Diagnosis not present

## 2017-03-02 DIAGNOSIS — H2513 Age-related nuclear cataract, bilateral: Secondary | ICD-10-CM | POA: Diagnosis not present

## 2017-03-02 DIAGNOSIS — H353122 Nonexudative age-related macular degeneration, left eye, intermediate dry stage: Secondary | ICD-10-CM | POA: Diagnosis not present

## 2017-03-02 DIAGNOSIS — H401131 Primary open-angle glaucoma, bilateral, mild stage: Secondary | ICD-10-CM | POA: Diagnosis not present

## 2017-06-01 DIAGNOSIS — E782 Mixed hyperlipidemia: Secondary | ICD-10-CM | POA: Diagnosis not present

## 2017-06-01 DIAGNOSIS — Z720 Tobacco use: Secondary | ICD-10-CM | POA: Diagnosis not present

## 2017-06-01 DIAGNOSIS — Z9189 Other specified personal risk factors, not elsewhere classified: Secondary | ICD-10-CM | POA: Diagnosis not present

## 2017-06-01 DIAGNOSIS — F17210 Nicotine dependence, cigarettes, uncomplicated: Secondary | ICD-10-CM | POA: Diagnosis not present

## 2017-06-01 DIAGNOSIS — D649 Anemia, unspecified: Secondary | ICD-10-CM | POA: Diagnosis not present

## 2017-06-01 DIAGNOSIS — I10 Essential (primary) hypertension: Secondary | ICD-10-CM | POA: Diagnosis not present

## 2017-06-01 DIAGNOSIS — R5382 Chronic fatigue, unspecified: Secondary | ICD-10-CM | POA: Diagnosis not present

## 2017-06-03 DIAGNOSIS — Z1212 Encounter for screening for malignant neoplasm of rectum: Secondary | ICD-10-CM | POA: Diagnosis not present

## 2017-06-03 DIAGNOSIS — Z0001 Encounter for general adult medical examination with abnormal findings: Secondary | ICD-10-CM | POA: Diagnosis not present

## 2017-06-03 DIAGNOSIS — E6609 Other obesity due to excess calories: Secondary | ICD-10-CM | POA: Diagnosis not present

## 2017-06-03 DIAGNOSIS — Z720 Tobacco use: Secondary | ICD-10-CM | POA: Diagnosis not present

## 2017-06-03 DIAGNOSIS — E782 Mixed hyperlipidemia: Secondary | ICD-10-CM | POA: Diagnosis not present

## 2017-06-03 DIAGNOSIS — G4733 Obstructive sleep apnea (adult) (pediatric): Secondary | ICD-10-CM | POA: Diagnosis not present

## 2017-06-03 DIAGNOSIS — I10 Essential (primary) hypertension: Secondary | ICD-10-CM | POA: Diagnosis not present

## 2017-06-03 DIAGNOSIS — F17210 Nicotine dependence, cigarettes, uncomplicated: Secondary | ICD-10-CM | POA: Diagnosis not present

## 2017-06-03 DIAGNOSIS — Z23 Encounter for immunization: Secondary | ICD-10-CM | POA: Diagnosis not present

## 2017-07-03 DIAGNOSIS — H401131 Primary open-angle glaucoma, bilateral, mild stage: Secondary | ICD-10-CM | POA: Diagnosis not present

## 2017-07-03 DIAGNOSIS — H18413 Arcus senilis, bilateral: Secondary | ICD-10-CM | POA: Diagnosis not present

## 2017-07-03 DIAGNOSIS — H2513 Age-related nuclear cataract, bilateral: Secondary | ICD-10-CM | POA: Diagnosis not present

## 2017-11-05 DIAGNOSIS — H401131 Primary open-angle glaucoma, bilateral, mild stage: Secondary | ICD-10-CM | POA: Diagnosis not present

## 2017-11-05 DIAGNOSIS — H53032 Strabismic amblyopia, left eye: Secondary | ICD-10-CM | POA: Diagnosis not present

## 2017-11-09 DIAGNOSIS — F17210 Nicotine dependence, cigarettes, uncomplicated: Secondary | ICD-10-CM | POA: Diagnosis not present

## 2017-11-09 DIAGNOSIS — R42 Dizziness and giddiness: Secondary | ICD-10-CM | POA: Diagnosis not present

## 2017-11-09 DIAGNOSIS — E782 Mixed hyperlipidemia: Secondary | ICD-10-CM | POA: Diagnosis not present

## 2017-11-09 DIAGNOSIS — Z9189 Other specified personal risk factors, not elsewhere classified: Secondary | ICD-10-CM | POA: Diagnosis not present

## 2017-11-09 DIAGNOSIS — I10 Essential (primary) hypertension: Secondary | ICD-10-CM | POA: Diagnosis not present

## 2017-11-10 DIAGNOSIS — Z6835 Body mass index (BMI) 35.0-35.9, adult: Secondary | ICD-10-CM | POA: Diagnosis not present

## 2017-11-10 DIAGNOSIS — E6609 Other obesity due to excess calories: Secondary | ICD-10-CM | POA: Diagnosis not present

## 2017-11-10 DIAGNOSIS — G4733 Obstructive sleep apnea (adult) (pediatric): Secondary | ICD-10-CM | POA: Diagnosis not present

## 2017-11-10 DIAGNOSIS — E782 Mixed hyperlipidemia: Secondary | ICD-10-CM | POA: Diagnosis not present

## 2017-11-10 DIAGNOSIS — I10 Essential (primary) hypertension: Secondary | ICD-10-CM | POA: Diagnosis not present

## 2017-11-10 DIAGNOSIS — F17210 Nicotine dependence, cigarettes, uncomplicated: Secondary | ICD-10-CM | POA: Diagnosis not present

## 2017-11-26 DIAGNOSIS — H401132 Primary open-angle glaucoma, bilateral, moderate stage: Secondary | ICD-10-CM | POA: Diagnosis not present

## 2017-12-03 DIAGNOSIS — F17200 Nicotine dependence, unspecified, uncomplicated: Secondary | ICD-10-CM | POA: Diagnosis not present

## 2017-12-03 DIAGNOSIS — T148XXA Other injury of unspecified body region, initial encounter: Secondary | ICD-10-CM | POA: Diagnosis not present

## 2017-12-03 DIAGNOSIS — I10 Essential (primary) hypertension: Secondary | ICD-10-CM | POA: Diagnosis not present

## 2017-12-03 DIAGNOSIS — Z79899 Other long term (current) drug therapy: Secondary | ICD-10-CM | POA: Diagnosis not present

## 2017-12-03 DIAGNOSIS — S0990XA Unspecified injury of head, initial encounter: Secondary | ICD-10-CM | POA: Diagnosis not present

## 2017-12-03 DIAGNOSIS — F329 Major depressive disorder, single episode, unspecified: Secondary | ICD-10-CM | POA: Diagnosis not present

## 2017-12-03 DIAGNOSIS — K219 Gastro-esophageal reflux disease without esophagitis: Secondary | ICD-10-CM | POA: Diagnosis not present

## 2017-12-03 DIAGNOSIS — S199XXA Unspecified injury of neck, initial encounter: Secondary | ICD-10-CM | POA: Diagnosis not present

## 2017-12-03 DIAGNOSIS — M542 Cervicalgia: Secondary | ICD-10-CM | POA: Diagnosis not present

## 2017-12-03 DIAGNOSIS — E7800 Pure hypercholesterolemia, unspecified: Secondary | ICD-10-CM | POA: Diagnosis not present

## 2017-12-03 DIAGNOSIS — Z7982 Long term (current) use of aspirin: Secondary | ICD-10-CM | POA: Diagnosis not present

## 2017-12-03 DIAGNOSIS — S161XXA Strain of muscle, fascia and tendon at neck level, initial encounter: Secondary | ICD-10-CM | POA: Diagnosis not present

## 2017-12-03 DIAGNOSIS — M549 Dorsalgia, unspecified: Secondary | ICD-10-CM | POA: Diagnosis not present

## 2017-12-07 DIAGNOSIS — K219 Gastro-esophageal reflux disease without esophagitis: Secondary | ICD-10-CM | POA: Diagnosis not present

## 2017-12-07 DIAGNOSIS — Z79899 Other long term (current) drug therapy: Secondary | ICD-10-CM | POA: Diagnosis not present

## 2017-12-07 DIAGNOSIS — S161XXA Strain of muscle, fascia and tendon at neck level, initial encounter: Secondary | ICD-10-CM | POA: Diagnosis not present

## 2017-12-07 DIAGNOSIS — Z7982 Long term (current) use of aspirin: Secondary | ICD-10-CM | POA: Diagnosis not present

## 2017-12-07 DIAGNOSIS — E7800 Pure hypercholesterolemia, unspecified: Secondary | ICD-10-CM | POA: Diagnosis not present

## 2017-12-07 DIAGNOSIS — S39012A Strain of muscle, fascia and tendon of lower back, initial encounter: Secondary | ICD-10-CM | POA: Diagnosis not present

## 2017-12-07 DIAGNOSIS — F17200 Nicotine dependence, unspecified, uncomplicated: Secondary | ICD-10-CM | POA: Diagnosis not present

## 2017-12-07 DIAGNOSIS — I10 Essential (primary) hypertension: Secondary | ICD-10-CM | POA: Diagnosis not present

## 2017-12-09 DIAGNOSIS — M545 Low back pain: Secondary | ICD-10-CM | POA: Diagnosis not present

## 2017-12-09 DIAGNOSIS — M542 Cervicalgia: Secondary | ICD-10-CM | POA: Diagnosis not present

## 2017-12-15 DIAGNOSIS — M542 Cervicalgia: Secondary | ICD-10-CM | POA: Diagnosis not present

## 2017-12-15 DIAGNOSIS — M545 Low back pain: Secondary | ICD-10-CM | POA: Diagnosis not present

## 2017-12-18 DIAGNOSIS — M542 Cervicalgia: Secondary | ICD-10-CM | POA: Diagnosis not present

## 2017-12-18 DIAGNOSIS — M545 Low back pain: Secondary | ICD-10-CM | POA: Diagnosis not present

## 2017-12-23 DIAGNOSIS — M545 Low back pain: Secondary | ICD-10-CM | POA: Diagnosis not present

## 2017-12-23 DIAGNOSIS — M542 Cervicalgia: Secondary | ICD-10-CM | POA: Diagnosis not present

## 2017-12-25 DIAGNOSIS — M542 Cervicalgia: Secondary | ICD-10-CM | POA: Diagnosis not present

## 2017-12-25 DIAGNOSIS — M545 Low back pain: Secondary | ICD-10-CM | POA: Diagnosis not present

## 2017-12-30 DIAGNOSIS — M542 Cervicalgia: Secondary | ICD-10-CM | POA: Diagnosis not present

## 2017-12-30 DIAGNOSIS — M545 Low back pain: Secondary | ICD-10-CM | POA: Diagnosis not present

## 2017-12-31 DIAGNOSIS — F17210 Nicotine dependence, cigarettes, uncomplicated: Secondary | ICD-10-CM | POA: Diagnosis not present

## 2017-12-31 DIAGNOSIS — S161XXA Strain of muscle, fascia and tendon at neck level, initial encounter: Secondary | ICD-10-CM | POA: Diagnosis not present

## 2017-12-31 DIAGNOSIS — M545 Low back pain: Secondary | ICD-10-CM | POA: Diagnosis not present

## 2017-12-31 DIAGNOSIS — I10 Essential (primary) hypertension: Secondary | ICD-10-CM | POA: Diagnosis not present

## 2017-12-31 DIAGNOSIS — E782 Mixed hyperlipidemia: Secondary | ICD-10-CM | POA: Diagnosis not present

## 2017-12-31 DIAGNOSIS — Z6834 Body mass index (BMI) 34.0-34.9, adult: Secondary | ICD-10-CM | POA: Diagnosis not present

## 2018-01-01 DIAGNOSIS — M545 Low back pain: Secondary | ICD-10-CM | POA: Diagnosis not present

## 2018-01-01 DIAGNOSIS — M542 Cervicalgia: Secondary | ICD-10-CM | POA: Diagnosis not present

## 2018-01-06 DIAGNOSIS — M542 Cervicalgia: Secondary | ICD-10-CM | POA: Diagnosis not present

## 2018-01-06 DIAGNOSIS — M545 Low back pain: Secondary | ICD-10-CM | POA: Diagnosis not present

## 2018-01-08 DIAGNOSIS — M542 Cervicalgia: Secondary | ICD-10-CM | POA: Diagnosis not present

## 2018-01-08 DIAGNOSIS — M545 Low back pain: Secondary | ICD-10-CM | POA: Diagnosis not present

## 2018-01-13 DIAGNOSIS — M545 Low back pain: Secondary | ICD-10-CM | POA: Diagnosis not present

## 2018-01-13 DIAGNOSIS — M542 Cervicalgia: Secondary | ICD-10-CM | POA: Diagnosis not present

## 2018-01-15 DIAGNOSIS — M542 Cervicalgia: Secondary | ICD-10-CM | POA: Diagnosis not present

## 2018-01-15 DIAGNOSIS — M545 Low back pain: Secondary | ICD-10-CM | POA: Diagnosis not present

## 2018-01-20 DIAGNOSIS — M545 Low back pain: Secondary | ICD-10-CM | POA: Diagnosis not present

## 2018-01-20 DIAGNOSIS — M542 Cervicalgia: Secondary | ICD-10-CM | POA: Diagnosis not present

## 2018-01-25 DIAGNOSIS — M545 Low back pain: Secondary | ICD-10-CM | POA: Diagnosis not present

## 2018-01-25 DIAGNOSIS — Z720 Tobacco use: Secondary | ICD-10-CM | POA: Diagnosis not present

## 2018-01-25 DIAGNOSIS — Z6834 Body mass index (BMI) 34.0-34.9, adult: Secondary | ICD-10-CM | POA: Diagnosis not present

## 2018-01-25 DIAGNOSIS — I10 Essential (primary) hypertension: Secondary | ICD-10-CM | POA: Diagnosis not present

## 2018-01-25 DIAGNOSIS — G4733 Obstructive sleep apnea (adult) (pediatric): Secondary | ICD-10-CM | POA: Diagnosis not present

## 2018-01-25 DIAGNOSIS — E782 Mixed hyperlipidemia: Secondary | ICD-10-CM | POA: Diagnosis not present

## 2018-01-25 DIAGNOSIS — F17210 Nicotine dependence, cigarettes, uncomplicated: Secondary | ICD-10-CM | POA: Diagnosis not present

## 2018-01-25 DIAGNOSIS — S161XXA Strain of muscle, fascia and tendon at neck level, initial encounter: Secondary | ICD-10-CM | POA: Diagnosis not present

## 2018-03-04 DIAGNOSIS — Z1389 Encounter for screening for other disorder: Secondary | ICD-10-CM | POA: Diagnosis not present

## 2018-03-04 DIAGNOSIS — Z6833 Body mass index (BMI) 33.0-33.9, adult: Secondary | ICD-10-CM | POA: Diagnosis not present

## 2018-03-04 DIAGNOSIS — G4733 Obstructive sleep apnea (adult) (pediatric): Secondary | ICD-10-CM | POA: Diagnosis not present

## 2018-03-04 DIAGNOSIS — Z1331 Encounter for screening for depression: Secondary | ICD-10-CM | POA: Diagnosis not present

## 2018-03-04 DIAGNOSIS — E782 Mixed hyperlipidemia: Secondary | ICD-10-CM | POA: Diagnosis not present

## 2018-03-04 DIAGNOSIS — I10 Essential (primary) hypertension: Secondary | ICD-10-CM | POA: Diagnosis not present

## 2018-03-24 DIAGNOSIS — E782 Mixed hyperlipidemia: Secondary | ICD-10-CM | POA: Diagnosis not present

## 2018-03-24 DIAGNOSIS — G4733 Obstructive sleep apnea (adult) (pediatric): Secondary | ICD-10-CM | POA: Diagnosis not present

## 2018-03-24 DIAGNOSIS — I10 Essential (primary) hypertension: Secondary | ICD-10-CM | POA: Diagnosis not present

## 2018-04-02 DIAGNOSIS — H401132 Primary open-angle glaucoma, bilateral, moderate stage: Secondary | ICD-10-CM | POA: Diagnosis not present

## 2018-04-02 DIAGNOSIS — H401111 Primary open-angle glaucoma, right eye, mild stage: Secondary | ICD-10-CM | POA: Diagnosis not present

## 2018-04-02 DIAGNOSIS — H2512 Age-related nuclear cataract, left eye: Secondary | ICD-10-CM | POA: Diagnosis not present

## 2018-04-23 DIAGNOSIS — H2512 Age-related nuclear cataract, left eye: Secondary | ICD-10-CM | POA: Diagnosis not present

## 2018-04-23 DIAGNOSIS — H409 Unspecified glaucoma: Secondary | ICD-10-CM | POA: Diagnosis not present

## 2018-04-23 DIAGNOSIS — H401123 Primary open-angle glaucoma, left eye, severe stage: Secondary | ICD-10-CM | POA: Diagnosis not present

## 2018-04-23 DIAGNOSIS — Z01818 Encounter for other preprocedural examination: Secondary | ICD-10-CM | POA: Diagnosis not present

## 2018-04-26 DIAGNOSIS — I10 Essential (primary) hypertension: Secondary | ICD-10-CM | POA: Diagnosis not present

## 2018-04-26 DIAGNOSIS — H409 Unspecified glaucoma: Secondary | ICD-10-CM | POA: Diagnosis not present

## 2018-04-26 DIAGNOSIS — E782 Mixed hyperlipidemia: Secondary | ICD-10-CM | POA: Diagnosis not present

## 2018-05-26 DIAGNOSIS — I10 Essential (primary) hypertension: Secondary | ICD-10-CM | POA: Diagnosis not present

## 2018-05-26 DIAGNOSIS — H409 Unspecified glaucoma: Secondary | ICD-10-CM | POA: Diagnosis not present

## 2018-05-26 DIAGNOSIS — E782 Mixed hyperlipidemia: Secondary | ICD-10-CM | POA: Diagnosis not present

## 2018-06-17 DIAGNOSIS — Z23 Encounter for immunization: Secondary | ICD-10-CM | POA: Diagnosis not present

## 2018-06-17 DIAGNOSIS — Z6833 Body mass index (BMI) 33.0-33.9, adult: Secondary | ICD-10-CM | POA: Diagnosis not present

## 2018-06-17 DIAGNOSIS — G4733 Obstructive sleep apnea (adult) (pediatric): Secondary | ICD-10-CM | POA: Diagnosis not present

## 2018-06-17 DIAGNOSIS — F17210 Nicotine dependence, cigarettes, uncomplicated: Secondary | ICD-10-CM | POA: Diagnosis not present

## 2018-06-17 DIAGNOSIS — E782 Mixed hyperlipidemia: Secondary | ICD-10-CM | POA: Diagnosis not present

## 2018-06-17 DIAGNOSIS — Z9189 Other specified personal risk factors, not elsewhere classified: Secondary | ICD-10-CM | POA: Diagnosis not present

## 2018-06-17 DIAGNOSIS — Z1212 Encounter for screening for malignant neoplasm of rectum: Secondary | ICD-10-CM | POA: Diagnosis not present

## 2018-06-17 DIAGNOSIS — I10 Essential (primary) hypertension: Secondary | ICD-10-CM | POA: Diagnosis not present

## 2018-06-17 DIAGNOSIS — Z0001 Encounter for general adult medical examination with abnormal findings: Secondary | ICD-10-CM | POA: Diagnosis not present

## 2018-07-05 DIAGNOSIS — H401111 Primary open-angle glaucoma, right eye, mild stage: Secondary | ICD-10-CM | POA: Diagnosis not present

## 2018-07-14 DIAGNOSIS — R195 Other fecal abnormalities: Secondary | ICD-10-CM | POA: Diagnosis not present

## 2018-07-19 DIAGNOSIS — H2511 Age-related nuclear cataract, right eye: Secondary | ICD-10-CM | POA: Diagnosis not present

## 2018-07-19 DIAGNOSIS — H409 Unspecified glaucoma: Secondary | ICD-10-CM | POA: Diagnosis not present

## 2018-07-19 DIAGNOSIS — Z01818 Encounter for other preprocedural examination: Secondary | ICD-10-CM | POA: Diagnosis not present

## 2018-07-19 DIAGNOSIS — H401111 Primary open-angle glaucoma, right eye, mild stage: Secondary | ICD-10-CM | POA: Diagnosis not present

## 2018-08-31 DIAGNOSIS — J440 Chronic obstructive pulmonary disease with acute lower respiratory infection: Secondary | ICD-10-CM | POA: Diagnosis not present

## 2018-08-31 DIAGNOSIS — J209 Acute bronchitis, unspecified: Secondary | ICD-10-CM | POA: Diagnosis not present

## 2018-08-31 DIAGNOSIS — Z6834 Body mass index (BMI) 34.0-34.9, adult: Secondary | ICD-10-CM | POA: Diagnosis not present

## 2018-09-13 DIAGNOSIS — Z0100 Encounter for examination of eyes and vision without abnormal findings: Secondary | ICD-10-CM | POA: Diagnosis not present

## 2018-10-08 DIAGNOSIS — R42 Dizziness and giddiness: Secondary | ICD-10-CM | POA: Diagnosis not present

## 2018-10-08 DIAGNOSIS — I10 Essential (primary) hypertension: Secondary | ICD-10-CM | POA: Diagnosis not present

## 2018-10-08 DIAGNOSIS — E782 Mixed hyperlipidemia: Secondary | ICD-10-CM | POA: Diagnosis not present

## 2018-10-08 DIAGNOSIS — R5382 Chronic fatigue, unspecified: Secondary | ICD-10-CM | POA: Diagnosis not present

## 2018-10-11 DIAGNOSIS — E782 Mixed hyperlipidemia: Secondary | ICD-10-CM | POA: Diagnosis not present

## 2018-10-11 DIAGNOSIS — G4733 Obstructive sleep apnea (adult) (pediatric): Secondary | ICD-10-CM | POA: Diagnosis not present

## 2018-10-11 DIAGNOSIS — F17210 Nicotine dependence, cigarettes, uncomplicated: Secondary | ICD-10-CM | POA: Diagnosis not present

## 2018-10-11 DIAGNOSIS — Z6834 Body mass index (BMI) 34.0-34.9, adult: Secondary | ICD-10-CM | POA: Diagnosis not present

## 2018-10-11 DIAGNOSIS — I10 Essential (primary) hypertension: Secondary | ICD-10-CM | POA: Diagnosis not present

## 2018-11-23 DIAGNOSIS — R5382 Chronic fatigue, unspecified: Secondary | ICD-10-CM | POA: Diagnosis not present

## 2018-11-23 DIAGNOSIS — R5383 Other fatigue: Secondary | ICD-10-CM | POA: Diagnosis not present

## 2018-11-23 DIAGNOSIS — R42 Dizziness and giddiness: Secondary | ICD-10-CM | POA: Diagnosis not present

## 2019-01-05 DIAGNOSIS — D649 Anemia, unspecified: Secondary | ICD-10-CM | POA: Diagnosis not present

## 2019-01-05 DIAGNOSIS — D529 Folate deficiency anemia, unspecified: Secondary | ICD-10-CM | POA: Diagnosis not present

## 2019-01-05 DIAGNOSIS — D519 Vitamin B12 deficiency anemia, unspecified: Secondary | ICD-10-CM | POA: Diagnosis not present

## 2019-01-06 DIAGNOSIS — M545 Low back pain: Secondary | ICD-10-CM | POA: Diagnosis not present

## 2019-01-06 DIAGNOSIS — Z6834 Body mass index (BMI) 34.0-34.9, adult: Secondary | ICD-10-CM | POA: Diagnosis not present

## 2019-01-14 DIAGNOSIS — H401132 Primary open-angle glaucoma, bilateral, moderate stage: Secondary | ICD-10-CM | POA: Diagnosis not present

## 2019-02-07 DIAGNOSIS — D519 Vitamin B12 deficiency anemia, unspecified: Secondary | ICD-10-CM | POA: Diagnosis not present

## 2019-02-07 DIAGNOSIS — R5382 Chronic fatigue, unspecified: Secondary | ICD-10-CM | POA: Diagnosis not present

## 2019-02-07 DIAGNOSIS — D529 Folate deficiency anemia, unspecified: Secondary | ICD-10-CM | POA: Diagnosis not present

## 2019-02-07 DIAGNOSIS — R42 Dizziness and giddiness: Secondary | ICD-10-CM | POA: Diagnosis not present

## 2019-02-07 DIAGNOSIS — D649 Anemia, unspecified: Secondary | ICD-10-CM | POA: Diagnosis not present

## 2019-02-07 DIAGNOSIS — I10 Essential (primary) hypertension: Secondary | ICD-10-CM | POA: Diagnosis not present

## 2019-02-07 DIAGNOSIS — E782 Mixed hyperlipidemia: Secondary | ICD-10-CM | POA: Diagnosis not present

## 2019-02-10 DIAGNOSIS — I10 Essential (primary) hypertension: Secondary | ICD-10-CM | POA: Diagnosis not present

## 2019-02-10 DIAGNOSIS — Z6834 Body mass index (BMI) 34.0-34.9, adult: Secondary | ICD-10-CM | POA: Diagnosis not present

## 2019-02-10 DIAGNOSIS — D509 Iron deficiency anemia, unspecified: Secondary | ICD-10-CM | POA: Diagnosis not present

## 2019-02-10 DIAGNOSIS — F17210 Nicotine dependence, cigarettes, uncomplicated: Secondary | ICD-10-CM | POA: Diagnosis not present

## 2019-02-10 DIAGNOSIS — E782 Mixed hyperlipidemia: Secondary | ICD-10-CM | POA: Diagnosis not present

## 2019-02-10 DIAGNOSIS — G4733 Obstructive sleep apnea (adult) (pediatric): Secondary | ICD-10-CM | POA: Diagnosis not present

## 2019-04-05 DIAGNOSIS — D529 Folate deficiency anemia, unspecified: Secondary | ICD-10-CM | POA: Diagnosis not present

## 2019-04-05 DIAGNOSIS — D649 Anemia, unspecified: Secondary | ICD-10-CM | POA: Diagnosis not present

## 2019-04-05 DIAGNOSIS — D519 Vitamin B12 deficiency anemia, unspecified: Secondary | ICD-10-CM | POA: Diagnosis not present

## 2019-04-08 DIAGNOSIS — H401123 Primary open-angle glaucoma, left eye, severe stage: Secondary | ICD-10-CM | POA: Diagnosis not present

## 2019-04-08 DIAGNOSIS — H401111 Primary open-angle glaucoma, right eye, mild stage: Secondary | ICD-10-CM | POA: Diagnosis not present

## 2019-05-27 DIAGNOSIS — E782 Mixed hyperlipidemia: Secondary | ICD-10-CM | POA: Diagnosis not present

## 2019-05-27 DIAGNOSIS — I10 Essential (primary) hypertension: Secondary | ICD-10-CM | POA: Diagnosis not present

## 2019-06-20 DIAGNOSIS — E782 Mixed hyperlipidemia: Secondary | ICD-10-CM | POA: Diagnosis not present

## 2019-06-20 DIAGNOSIS — D649 Anemia, unspecified: Secondary | ICD-10-CM | POA: Diagnosis not present

## 2019-06-20 DIAGNOSIS — I10 Essential (primary) hypertension: Secondary | ICD-10-CM | POA: Diagnosis not present

## 2019-06-20 DIAGNOSIS — E039 Hypothyroidism, unspecified: Secondary | ICD-10-CM | POA: Diagnosis not present

## 2019-06-20 DIAGNOSIS — E1165 Type 2 diabetes mellitus with hyperglycemia: Secondary | ICD-10-CM | POA: Diagnosis not present

## 2019-06-20 DIAGNOSIS — K219 Gastro-esophageal reflux disease without esophagitis: Secondary | ICD-10-CM | POA: Diagnosis not present

## 2019-06-24 DIAGNOSIS — I700 Atherosclerosis of aorta: Secondary | ICD-10-CM | POA: Diagnosis not present

## 2019-06-24 DIAGNOSIS — I10 Essential (primary) hypertension: Secondary | ICD-10-CM | POA: Diagnosis not present

## 2019-06-24 DIAGNOSIS — D509 Iron deficiency anemia, unspecified: Secondary | ICD-10-CM | POA: Diagnosis not present

## 2019-06-24 DIAGNOSIS — J439 Emphysema, unspecified: Secondary | ICD-10-CM | POA: Diagnosis not present

## 2019-06-24 DIAGNOSIS — E782 Mixed hyperlipidemia: Secondary | ICD-10-CM | POA: Diagnosis not present

## 2019-06-24 DIAGNOSIS — F17210 Nicotine dependence, cigarettes, uncomplicated: Secondary | ICD-10-CM | POA: Diagnosis not present

## 2019-06-24 DIAGNOSIS — Z6834 Body mass index (BMI) 34.0-34.9, adult: Secondary | ICD-10-CM | POA: Diagnosis not present

## 2019-06-24 DIAGNOSIS — E119 Type 2 diabetes mellitus without complications: Secondary | ICD-10-CM | POA: Diagnosis not present

## 2019-06-24 DIAGNOSIS — Z0001 Encounter for general adult medical examination with abnormal findings: Secondary | ICD-10-CM | POA: Diagnosis not present

## 2019-06-24 DIAGNOSIS — Z23 Encounter for immunization: Secondary | ICD-10-CM | POA: Diagnosis not present

## 2019-06-27 DIAGNOSIS — E782 Mixed hyperlipidemia: Secondary | ICD-10-CM | POA: Diagnosis not present

## 2019-06-27 DIAGNOSIS — I10 Essential (primary) hypertension: Secondary | ICD-10-CM | POA: Diagnosis not present

## 2019-06-30 DIAGNOSIS — Z122 Encounter for screening for malignant neoplasm of respiratory organs: Secondary | ICD-10-CM | POA: Diagnosis not present

## 2019-06-30 DIAGNOSIS — F17210 Nicotine dependence, cigarettes, uncomplicated: Secondary | ICD-10-CM | POA: Diagnosis not present

## 2019-07-11 DIAGNOSIS — E041 Nontoxic single thyroid nodule: Secondary | ICD-10-CM | POA: Diagnosis not present

## 2019-07-11 DIAGNOSIS — E042 Nontoxic multinodular goiter: Secondary | ICD-10-CM | POA: Diagnosis not present

## 2019-07-18 DIAGNOSIS — H401132 Primary open-angle glaucoma, bilateral, moderate stage: Secondary | ICD-10-CM | POA: Diagnosis not present

## 2019-08-12 DIAGNOSIS — E0789 Other specified disorders of thyroid: Secondary | ICD-10-CM | POA: Diagnosis not present

## 2019-08-12 DIAGNOSIS — E041 Nontoxic single thyroid nodule: Secondary | ICD-10-CM | POA: Diagnosis not present

## 2019-10-12 DIAGNOSIS — H401111 Primary open-angle glaucoma, right eye, mild stage: Secondary | ICD-10-CM | POA: Diagnosis not present

## 2019-10-14 DIAGNOSIS — D519 Vitamin B12 deficiency anemia, unspecified: Secondary | ICD-10-CM | POA: Diagnosis not present

## 2019-10-14 DIAGNOSIS — E782 Mixed hyperlipidemia: Secondary | ICD-10-CM | POA: Diagnosis not present

## 2019-10-14 DIAGNOSIS — F17210 Nicotine dependence, cigarettes, uncomplicated: Secondary | ICD-10-CM | POA: Diagnosis not present

## 2019-10-14 DIAGNOSIS — G4733 Obstructive sleep apnea (adult) (pediatric): Secondary | ICD-10-CM | POA: Diagnosis not present

## 2019-10-14 DIAGNOSIS — R946 Abnormal results of thyroid function studies: Secondary | ICD-10-CM | POA: Diagnosis not present

## 2019-10-14 DIAGNOSIS — E039 Hypothyroidism, unspecified: Secondary | ICD-10-CM | POA: Diagnosis not present

## 2019-10-14 DIAGNOSIS — E1165 Type 2 diabetes mellitus with hyperglycemia: Secondary | ICD-10-CM | POA: Diagnosis not present

## 2019-10-14 DIAGNOSIS — D649 Anemia, unspecified: Secondary | ICD-10-CM | POA: Diagnosis not present

## 2019-10-14 DIAGNOSIS — E041 Nontoxic single thyroid nodule: Secondary | ICD-10-CM | POA: Diagnosis not present

## 2019-10-14 DIAGNOSIS — D509 Iron deficiency anemia, unspecified: Secondary | ICD-10-CM | POA: Diagnosis not present

## 2019-10-14 DIAGNOSIS — Z720 Tobacco use: Secondary | ICD-10-CM | POA: Diagnosis not present

## 2019-10-19 DIAGNOSIS — Z6834 Body mass index (BMI) 34.0-34.9, adult: Secondary | ICD-10-CM | POA: Diagnosis not present

## 2019-10-19 DIAGNOSIS — J439 Emphysema, unspecified: Secondary | ICD-10-CM | POA: Diagnosis not present

## 2019-10-19 DIAGNOSIS — E782 Mixed hyperlipidemia: Secondary | ICD-10-CM | POA: Diagnosis not present

## 2019-10-19 DIAGNOSIS — F17210 Nicotine dependence, cigarettes, uncomplicated: Secondary | ICD-10-CM | POA: Diagnosis not present

## 2019-10-19 DIAGNOSIS — I10 Essential (primary) hypertension: Secondary | ICD-10-CM | POA: Diagnosis not present

## 2019-10-19 DIAGNOSIS — D509 Iron deficiency anemia, unspecified: Secondary | ICD-10-CM | POA: Diagnosis not present

## 2019-10-19 DIAGNOSIS — I700 Atherosclerosis of aorta: Secondary | ICD-10-CM | POA: Diagnosis not present

## 2019-10-19 DIAGNOSIS — G4733 Obstructive sleep apnea (adult) (pediatric): Secondary | ICD-10-CM | POA: Diagnosis not present

## 2019-11-12 DIAGNOSIS — S29012A Strain of muscle and tendon of back wall of thorax, initial encounter: Secondary | ICD-10-CM | POA: Diagnosis not present

## 2019-12-26 DIAGNOSIS — E7849 Other hyperlipidemia: Secondary | ICD-10-CM | POA: Diagnosis not present

## 2019-12-26 DIAGNOSIS — K219 Gastro-esophageal reflux disease without esophagitis: Secondary | ICD-10-CM | POA: Diagnosis not present

## 2019-12-26 DIAGNOSIS — I10 Essential (primary) hypertension: Secondary | ICD-10-CM | POA: Diagnosis not present

## 2019-12-26 DIAGNOSIS — H409 Unspecified glaucoma: Secondary | ICD-10-CM | POA: Diagnosis not present

## 2020-01-16 DIAGNOSIS — H401132 Primary open-angle glaucoma, bilateral, moderate stage: Secondary | ICD-10-CM | POA: Diagnosis not present

## 2020-02-14 DIAGNOSIS — D529 Folate deficiency anemia, unspecified: Secondary | ICD-10-CM | POA: Diagnosis not present

## 2020-02-14 DIAGNOSIS — E782 Mixed hyperlipidemia: Secondary | ICD-10-CM | POA: Diagnosis not present

## 2020-02-14 DIAGNOSIS — R5382 Chronic fatigue, unspecified: Secondary | ICD-10-CM | POA: Diagnosis not present

## 2020-02-14 DIAGNOSIS — R739 Hyperglycemia, unspecified: Secondary | ICD-10-CM | POA: Diagnosis not present

## 2020-02-14 DIAGNOSIS — D519 Vitamin B12 deficiency anemia, unspecified: Secondary | ICD-10-CM | POA: Diagnosis not present

## 2020-02-14 DIAGNOSIS — K219 Gastro-esophageal reflux disease without esophagitis: Secondary | ICD-10-CM | POA: Diagnosis not present

## 2020-02-14 DIAGNOSIS — D509 Iron deficiency anemia, unspecified: Secondary | ICD-10-CM | POA: Diagnosis not present

## 2020-02-14 DIAGNOSIS — I10 Essential (primary) hypertension: Secondary | ICD-10-CM | POA: Diagnosis not present

## 2020-02-14 DIAGNOSIS — D649 Anemia, unspecified: Secondary | ICD-10-CM | POA: Diagnosis not present

## 2020-02-14 DIAGNOSIS — F17210 Nicotine dependence, cigarettes, uncomplicated: Secondary | ICD-10-CM | POA: Diagnosis not present

## 2020-02-14 DIAGNOSIS — E1165 Type 2 diabetes mellitus with hyperglycemia: Secondary | ICD-10-CM | POA: Diagnosis not present

## 2020-02-14 DIAGNOSIS — E039 Hypothyroidism, unspecified: Secondary | ICD-10-CM | POA: Diagnosis not present

## 2020-02-21 DIAGNOSIS — F17210 Nicotine dependence, cigarettes, uncomplicated: Secondary | ICD-10-CM | POA: Diagnosis not present

## 2020-02-21 DIAGNOSIS — I10 Essential (primary) hypertension: Secondary | ICD-10-CM | POA: Diagnosis not present

## 2020-02-21 DIAGNOSIS — J439 Emphysema, unspecified: Secondary | ICD-10-CM | POA: Diagnosis not present

## 2020-02-21 DIAGNOSIS — Z6834 Body mass index (BMI) 34.0-34.9, adult: Secondary | ICD-10-CM | POA: Diagnosis not present

## 2020-02-21 DIAGNOSIS — E782 Mixed hyperlipidemia: Secondary | ICD-10-CM | POA: Diagnosis not present

## 2020-02-21 DIAGNOSIS — E119 Type 2 diabetes mellitus without complications: Secondary | ICD-10-CM | POA: Diagnosis not present

## 2020-02-21 DIAGNOSIS — G4733 Obstructive sleep apnea (adult) (pediatric): Secondary | ICD-10-CM | POA: Diagnosis not present

## 2020-02-21 DIAGNOSIS — I700 Atherosclerosis of aorta: Secondary | ICD-10-CM | POA: Diagnosis not present

## 2020-02-21 DIAGNOSIS — D509 Iron deficiency anemia, unspecified: Secondary | ICD-10-CM | POA: Diagnosis not present

## 2020-02-24 DIAGNOSIS — I10 Essential (primary) hypertension: Secondary | ICD-10-CM | POA: Diagnosis not present

## 2020-02-24 DIAGNOSIS — H409 Unspecified glaucoma: Secondary | ICD-10-CM | POA: Diagnosis not present

## 2020-02-24 DIAGNOSIS — E7849 Other hyperlipidemia: Secondary | ICD-10-CM | POA: Diagnosis not present

## 2020-02-24 DIAGNOSIS — K219 Gastro-esophageal reflux disease without esophagitis: Secondary | ICD-10-CM | POA: Diagnosis not present

## 2020-03-27 DIAGNOSIS — E7849 Other hyperlipidemia: Secondary | ICD-10-CM | POA: Diagnosis not present

## 2020-03-27 DIAGNOSIS — I10 Essential (primary) hypertension: Secondary | ICD-10-CM | POA: Diagnosis not present

## 2020-03-27 DIAGNOSIS — H409 Unspecified glaucoma: Secondary | ICD-10-CM | POA: Diagnosis not present

## 2020-03-27 DIAGNOSIS — K219 Gastro-esophageal reflux disease without esophagitis: Secondary | ICD-10-CM | POA: Diagnosis not present

## 2020-04-20 DIAGNOSIS — Z20828 Contact with and (suspected) exposure to other viral communicable diseases: Secondary | ICD-10-CM | POA: Diagnosis not present

## 2020-04-26 DIAGNOSIS — H409 Unspecified glaucoma: Secondary | ICD-10-CM | POA: Diagnosis not present

## 2020-04-26 DIAGNOSIS — E7849 Other hyperlipidemia: Secondary | ICD-10-CM | POA: Diagnosis not present

## 2020-04-26 DIAGNOSIS — I10 Essential (primary) hypertension: Secondary | ICD-10-CM | POA: Diagnosis not present

## 2020-04-26 DIAGNOSIS — K219 Gastro-esophageal reflux disease without esophagitis: Secondary | ICD-10-CM | POA: Diagnosis not present

## 2020-04-30 DIAGNOSIS — Z20828 Contact with and (suspected) exposure to other viral communicable diseases: Secondary | ICD-10-CM | POA: Diagnosis not present

## 2020-05-02 DIAGNOSIS — Z6834 Body mass index (BMI) 34.0-34.9, adult: Secondary | ICD-10-CM | POA: Diagnosis not present

## 2020-05-02 DIAGNOSIS — Z23 Encounter for immunization: Secondary | ICD-10-CM | POA: Diagnosis not present

## 2020-05-02 DIAGNOSIS — N492 Inflammatory disorders of scrotum: Secondary | ICD-10-CM | POA: Diagnosis not present

## 2020-05-23 DIAGNOSIS — H401111 Primary open-angle glaucoma, right eye, mild stage: Secondary | ICD-10-CM | POA: Diagnosis not present

## 2020-05-23 DIAGNOSIS — H401123 Primary open-angle glaucoma, left eye, severe stage: Secondary | ICD-10-CM | POA: Diagnosis not present

## 2020-06-26 DIAGNOSIS — K219 Gastro-esophageal reflux disease without esophagitis: Secondary | ICD-10-CM | POA: Diagnosis not present

## 2020-06-26 DIAGNOSIS — D519 Vitamin B12 deficiency anemia, unspecified: Secondary | ICD-10-CM | POA: Diagnosis not present

## 2020-06-26 DIAGNOSIS — M109 Gout, unspecified: Secondary | ICD-10-CM | POA: Diagnosis not present

## 2020-06-26 DIAGNOSIS — I10 Essential (primary) hypertension: Secondary | ICD-10-CM | POA: Diagnosis not present

## 2020-06-26 DIAGNOSIS — D649 Anemia, unspecified: Secondary | ICD-10-CM | POA: Diagnosis not present

## 2020-06-26 DIAGNOSIS — E782 Mixed hyperlipidemia: Secondary | ICD-10-CM | POA: Diagnosis not present

## 2020-06-26 DIAGNOSIS — D529 Folate deficiency anemia, unspecified: Secondary | ICD-10-CM | POA: Diagnosis not present

## 2020-06-26 DIAGNOSIS — E039 Hypothyroidism, unspecified: Secondary | ICD-10-CM | POA: Diagnosis not present

## 2020-06-29 DIAGNOSIS — F17210 Nicotine dependence, cigarettes, uncomplicated: Secondary | ICD-10-CM | POA: Diagnosis not present

## 2020-06-29 DIAGNOSIS — I700 Atherosclerosis of aorta: Secondary | ICD-10-CM | POA: Diagnosis not present

## 2020-06-29 DIAGNOSIS — E782 Mixed hyperlipidemia: Secondary | ICD-10-CM | POA: Diagnosis not present

## 2020-06-29 DIAGNOSIS — I10 Essential (primary) hypertension: Secondary | ICD-10-CM | POA: Diagnosis not present

## 2020-06-29 DIAGNOSIS — J439 Emphysema, unspecified: Secondary | ICD-10-CM | POA: Diagnosis not present

## 2020-06-29 DIAGNOSIS — Z0001 Encounter for general adult medical examination with abnormal findings: Secondary | ICD-10-CM | POA: Diagnosis not present

## 2020-06-29 DIAGNOSIS — D509 Iron deficiency anemia, unspecified: Secondary | ICD-10-CM | POA: Diagnosis not present

## 2020-06-29 DIAGNOSIS — Z6835 Body mass index (BMI) 35.0-35.9, adult: Secondary | ICD-10-CM | POA: Diagnosis not present

## 2020-07-18 DIAGNOSIS — H401132 Primary open-angle glaucoma, bilateral, moderate stage: Secondary | ICD-10-CM | POA: Diagnosis not present

## 2020-10-17 DIAGNOSIS — Z6835 Body mass index (BMI) 35.0-35.9, adult: Secondary | ICD-10-CM | POA: Diagnosis not present

## 2020-10-17 DIAGNOSIS — N451 Epididymitis: Secondary | ICD-10-CM | POA: Diagnosis not present

## 2020-10-23 DIAGNOSIS — H401132 Primary open-angle glaucoma, bilateral, moderate stage: Secondary | ICD-10-CM | POA: Diagnosis not present

## 2020-10-24 DIAGNOSIS — H409 Unspecified glaucoma: Secondary | ICD-10-CM | POA: Diagnosis not present

## 2020-10-24 DIAGNOSIS — E1165 Type 2 diabetes mellitus with hyperglycemia: Secondary | ICD-10-CM | POA: Diagnosis not present

## 2020-10-24 DIAGNOSIS — E782 Mixed hyperlipidemia: Secondary | ICD-10-CM | POA: Diagnosis not present

## 2020-10-24 DIAGNOSIS — I10 Essential (primary) hypertension: Secondary | ICD-10-CM | POA: Diagnosis not present

## 2020-10-24 DIAGNOSIS — E7849 Other hyperlipidemia: Secondary | ICD-10-CM | POA: Diagnosis not present

## 2020-10-24 DIAGNOSIS — K219 Gastro-esophageal reflux disease without esophagitis: Secondary | ICD-10-CM | POA: Diagnosis not present

## 2020-10-26 DIAGNOSIS — E7849 Other hyperlipidemia: Secondary | ICD-10-CM | POA: Diagnosis not present

## 2020-10-26 DIAGNOSIS — G4733 Obstructive sleep apnea (adult) (pediatric): Secondary | ICD-10-CM | POA: Diagnosis not present

## 2020-10-26 DIAGNOSIS — J439 Emphysema, unspecified: Secondary | ICD-10-CM | POA: Diagnosis not present

## 2020-10-26 DIAGNOSIS — I700 Atherosclerosis of aorta: Secondary | ICD-10-CM | POA: Diagnosis not present

## 2020-10-26 DIAGNOSIS — D509 Iron deficiency anemia, unspecified: Secondary | ICD-10-CM | POA: Diagnosis not present

## 2020-10-26 DIAGNOSIS — F17210 Nicotine dependence, cigarettes, uncomplicated: Secondary | ICD-10-CM | POA: Diagnosis not present

## 2020-10-26 DIAGNOSIS — Z6835 Body mass index (BMI) 35.0-35.9, adult: Secondary | ICD-10-CM | POA: Diagnosis not present

## 2020-10-26 DIAGNOSIS — I10 Essential (primary) hypertension: Secondary | ICD-10-CM | POA: Diagnosis not present

## 2020-10-26 DIAGNOSIS — S32000A Wedge compression fracture of unspecified lumbar vertebra, initial encounter for closed fracture: Secondary | ICD-10-CM

## 2020-10-26 HISTORY — PX: KYPHOPLASTY: SHX5884

## 2020-10-26 HISTORY — DX: Wedge compression fracture of unspecified lumbar vertebra, initial encounter for closed fracture: S32.000A

## 2020-11-10 DIAGNOSIS — S32010A Wedge compression fracture of first lumbar vertebra, initial encounter for closed fracture: Secondary | ICD-10-CM | POA: Diagnosis not present

## 2020-11-10 DIAGNOSIS — G9340 Encephalopathy, unspecified: Secondary | ICD-10-CM | POA: Diagnosis not present

## 2020-11-10 DIAGNOSIS — S066X0A Traumatic subarachnoid hemorrhage without loss of consciousness, initial encounter: Secondary | ICD-10-CM | POA: Diagnosis not present

## 2020-11-10 DIAGNOSIS — S066X9A Traumatic subarachnoid hemorrhage with loss of consciousness of unspecified duration, initial encounter: Secondary | ICD-10-CM | POA: Diagnosis not present

## 2020-11-10 DIAGNOSIS — S22089A Unspecified fracture of T11-T12 vertebra, initial encounter for closed fracture: Secondary | ICD-10-CM | POA: Diagnosis not present

## 2020-11-10 DIAGNOSIS — I2510 Atherosclerotic heart disease of native coronary artery without angina pectoris: Secondary | ICD-10-CM | POA: Diagnosis not present

## 2020-11-10 DIAGNOSIS — S0990XA Unspecified injury of head, initial encounter: Secondary | ICD-10-CM | POA: Diagnosis not present

## 2020-11-10 DIAGNOSIS — I69091 Dysphagia following nontraumatic subarachnoid hemorrhage: Secondary | ICD-10-CM | POA: Diagnosis not present

## 2020-11-10 DIAGNOSIS — M4855XA Collapsed vertebra, not elsewhere classified, thoracolumbar region, initial encounter for fracture: Secondary | ICD-10-CM | POA: Diagnosis not present

## 2020-11-10 DIAGNOSIS — R9431 Abnormal electrocardiogram [ECG] [EKG]: Secondary | ICD-10-CM | POA: Diagnosis not present

## 2020-11-10 DIAGNOSIS — S22071A Stable burst fracture of T9-T10 vertebra, initial encounter for closed fracture: Secondary | ICD-10-CM | POA: Diagnosis not present

## 2020-11-10 DIAGNOSIS — S02119A Unspecified fracture of occiput, initial encounter for closed fracture: Secondary | ICD-10-CM | POA: Diagnosis not present

## 2020-11-10 DIAGNOSIS — S22070D Wedge compression fracture of T9-T10 vertebra, subsequent encounter for fracture with routine healing: Secondary | ICD-10-CM | POA: Diagnosis not present

## 2020-11-10 DIAGNOSIS — I77810 Thoracic aortic ectasia: Secondary | ICD-10-CM | POA: Diagnosis not present

## 2020-11-10 DIAGNOSIS — S065X0A Traumatic subdural hemorrhage without loss of consciousness, initial encounter: Secondary | ICD-10-CM | POA: Diagnosis not present

## 2020-11-10 DIAGNOSIS — S32018A Other fracture of first lumbar vertebra, initial encounter for closed fracture: Secondary | ICD-10-CM | POA: Diagnosis not present

## 2020-11-10 DIAGNOSIS — G4733 Obstructive sleep apnea (adult) (pediatric): Secondary | ICD-10-CM | POA: Diagnosis not present

## 2020-11-10 DIAGNOSIS — S22088A Other fracture of T11-T12 vertebra, initial encounter for closed fracture: Secondary | ICD-10-CM | POA: Diagnosis not present

## 2020-11-10 DIAGNOSIS — R001 Bradycardia, unspecified: Secondary | ICD-10-CM | POA: Diagnosis not present

## 2020-11-10 DIAGNOSIS — S066X0D Traumatic subarachnoid hemorrhage without loss of consciousness, subsequent encounter: Secondary | ICD-10-CM | POA: Diagnosis not present

## 2020-11-10 DIAGNOSIS — R5381 Other malaise: Secondary | ICD-10-CM | POA: Diagnosis not present

## 2020-11-10 DIAGNOSIS — M545 Low back pain, unspecified: Secondary | ICD-10-CM | POA: Diagnosis not present

## 2020-11-10 DIAGNOSIS — R0902 Hypoxemia: Secondary | ICD-10-CM | POA: Diagnosis not present

## 2020-11-10 DIAGNOSIS — S22081A Stable burst fracture of T11-T12 vertebra, initial encounter for closed fracture: Secondary | ICD-10-CM | POA: Diagnosis not present

## 2020-11-10 DIAGNOSIS — S0219XA Other fracture of base of skull, initial encounter for closed fracture: Secondary | ICD-10-CM | POA: Diagnosis not present

## 2020-11-10 DIAGNOSIS — I517 Cardiomegaly: Secondary | ICD-10-CM | POA: Diagnosis not present

## 2020-11-10 DIAGNOSIS — R4781 Slurred speech: Secondary | ICD-10-CM | POA: Diagnosis not present

## 2020-11-10 DIAGNOSIS — I10 Essential (primary) hypertension: Secondary | ICD-10-CM | POA: Diagnosis not present

## 2020-11-10 DIAGNOSIS — M6282 Rhabdomyolysis: Secondary | ICD-10-CM | POA: Diagnosis not present

## 2020-11-10 DIAGNOSIS — R404 Transient alteration of awareness: Secondary | ICD-10-CM | POA: Diagnosis not present

## 2020-11-10 DIAGNOSIS — M2578 Osteophyte, vertebrae: Secondary | ICD-10-CM | POA: Diagnosis not present

## 2020-11-10 DIAGNOSIS — R4182 Altered mental status, unspecified: Secondary | ICD-10-CM | POA: Diagnosis not present

## 2020-11-10 DIAGNOSIS — J9811 Atelectasis: Secondary | ICD-10-CM | POA: Diagnosis not present

## 2020-11-10 DIAGNOSIS — S32019A Unspecified fracture of first lumbar vertebra, initial encounter for closed fracture: Secondary | ICD-10-CM | POA: Diagnosis not present

## 2020-11-10 DIAGNOSIS — I6200 Nontraumatic subdural hemorrhage, unspecified: Secondary | ICD-10-CM | POA: Diagnosis not present

## 2020-11-10 DIAGNOSIS — S22070A Wedge compression fracture of T9-T10 vertebra, initial encounter for closed fracture: Secondary | ICD-10-CM | POA: Diagnosis not present

## 2020-11-10 DIAGNOSIS — K5730 Diverticulosis of large intestine without perforation or abscess without bleeding: Secondary | ICD-10-CM | POA: Diagnosis not present

## 2020-11-10 DIAGNOSIS — M4854XA Collapsed vertebra, not elsewhere classified, thoracic region, initial encounter for fracture: Secondary | ICD-10-CM | POA: Diagnosis not present

## 2020-11-10 DIAGNOSIS — S199XXA Unspecified injury of neck, initial encounter: Secondary | ICD-10-CM | POA: Diagnosis not present

## 2020-11-10 DIAGNOSIS — S32000A Wedge compression fracture of unspecified lumbar vertebra, initial encounter for closed fracture: Secondary | ICD-10-CM | POA: Diagnosis not present

## 2020-11-10 DIAGNOSIS — S32011A Stable burst fracture of first lumbar vertebra, initial encounter for closed fracture: Secondary | ICD-10-CM | POA: Diagnosis not present

## 2020-11-10 DIAGNOSIS — K5732 Diverticulitis of large intestine without perforation or abscess without bleeding: Secondary | ICD-10-CM | POA: Diagnosis not present

## 2020-11-10 DIAGNOSIS — S22080D Wedge compression fracture of T11-T12 vertebra, subsequent encounter for fracture with routine healing: Secondary | ICD-10-CM | POA: Diagnosis not present

## 2020-11-10 DIAGNOSIS — I69015 Cognitive social or emotional deficit following nontraumatic subarachnoid hemorrhage: Secondary | ICD-10-CM | POA: Diagnosis not present

## 2020-11-10 DIAGNOSIS — S3991XA Unspecified injury of abdomen, initial encounter: Secondary | ICD-10-CM | POA: Diagnosis not present

## 2020-11-10 DIAGNOSIS — K219 Gastro-esophageal reflux disease without esophagitis: Secondary | ICD-10-CM | POA: Diagnosis not present

## 2020-11-10 DIAGNOSIS — S22000D Wedge compression fracture of unspecified thoracic vertebra, subsequent encounter for fracture with routine healing: Secondary | ICD-10-CM | POA: Diagnosis not present

## 2020-11-10 DIAGNOSIS — D509 Iron deficiency anemia, unspecified: Secondary | ICD-10-CM | POA: Diagnosis not present

## 2020-11-10 DIAGNOSIS — E041 Nontoxic single thyroid nodule: Secondary | ICD-10-CM | POA: Diagnosis not present

## 2020-11-10 DIAGNOSIS — S22000A Wedge compression fracture of unspecified thoracic vertebra, initial encounter for closed fracture: Secondary | ICD-10-CM | POA: Diagnosis not present

## 2020-11-10 DIAGNOSIS — J984 Other disorders of lung: Secondary | ICD-10-CM | POA: Diagnosis not present

## 2020-11-10 DIAGNOSIS — H409 Unspecified glaucoma: Secondary | ICD-10-CM | POA: Diagnosis not present

## 2020-11-10 DIAGNOSIS — S22080A Wedge compression fracture of T11-T12 vertebra, initial encounter for closed fracture: Secondary | ICD-10-CM | POA: Diagnosis not present

## 2020-11-10 DIAGNOSIS — E7849 Other hyperlipidemia: Secondary | ICD-10-CM | POA: Diagnosis not present

## 2020-11-10 DIAGNOSIS — S069X9A Unspecified intracranial injury with loss of consciousness of unspecified duration, initial encounter: Secondary | ICD-10-CM | POA: Diagnosis not present

## 2020-11-10 DIAGNOSIS — M4812 Ankylosing hyperostosis [Forestier], cervical region: Secondary | ICD-10-CM | POA: Diagnosis not present

## 2020-11-10 DIAGNOSIS — R1310 Dysphagia, unspecified: Secondary | ICD-10-CM | POA: Diagnosis not present

## 2020-11-10 DIAGNOSIS — S0083XA Contusion of other part of head, initial encounter: Secondary | ICD-10-CM | POA: Diagnosis not present

## 2020-11-10 DIAGNOSIS — I161 Hypertensive emergency: Secondary | ICD-10-CM | POA: Diagnosis not present

## 2020-11-10 DIAGNOSIS — X58XXXA Exposure to other specified factors, initial encounter: Secondary | ICD-10-CM | POA: Diagnosis not present

## 2020-11-10 DIAGNOSIS — Z043 Encounter for examination and observation following other accident: Secondary | ICD-10-CM | POA: Diagnosis not present

## 2020-11-10 DIAGNOSIS — M546 Pain in thoracic spine: Secondary | ICD-10-CM | POA: Diagnosis not present

## 2020-11-10 DIAGNOSIS — I700 Atherosclerosis of aorta: Secondary | ICD-10-CM | POA: Diagnosis not present

## 2020-11-10 DIAGNOSIS — Z743 Need for continuous supervision: Secondary | ICD-10-CM | POA: Diagnosis not present

## 2020-11-10 DIAGNOSIS — R2689 Other abnormalities of gait and mobility: Secondary | ICD-10-CM | POA: Diagnosis not present

## 2020-11-10 DIAGNOSIS — M6281 Muscle weakness (generalized): Secondary | ICD-10-CM | POA: Diagnosis not present

## 2020-11-10 DIAGNOSIS — Z9181 History of falling: Secondary | ICD-10-CM | POA: Diagnosis not present

## 2020-11-10 DIAGNOSIS — S065X9A Traumatic subdural hemorrhage with loss of consciousness of unspecified duration, initial encounter: Secondary | ICD-10-CM | POA: Diagnosis not present

## 2020-11-10 DIAGNOSIS — R279 Unspecified lack of coordination: Secondary | ICD-10-CM | POA: Diagnosis not present

## 2020-11-10 DIAGNOSIS — R29898 Other symptoms and signs involving the musculoskeletal system: Secondary | ICD-10-CM | POA: Diagnosis not present

## 2020-11-11 DIAGNOSIS — Z043 Encounter for examination and observation following other accident: Secondary | ICD-10-CM | POA: Diagnosis not present

## 2020-11-11 DIAGNOSIS — S22089A Unspecified fracture of T11-T12 vertebra, initial encounter for closed fracture: Secondary | ICD-10-CM | POA: Diagnosis not present

## 2020-11-11 DIAGNOSIS — I161 Hypertensive emergency: Secondary | ICD-10-CM | POA: Diagnosis not present

## 2020-11-11 DIAGNOSIS — S32018A Other fracture of first lumbar vertebra, initial encounter for closed fracture: Secondary | ICD-10-CM | POA: Diagnosis not present

## 2020-11-11 DIAGNOSIS — S069X9A Unspecified intracranial injury with loss of consciousness of unspecified duration, initial encounter: Secondary | ICD-10-CM | POA: Diagnosis not present

## 2020-11-11 DIAGNOSIS — S065X9A Traumatic subdural hemorrhage with loss of consciousness of unspecified duration, initial encounter: Secondary | ICD-10-CM | POA: Diagnosis not present

## 2020-11-11 DIAGNOSIS — S02119A Unspecified fracture of occiput, initial encounter for closed fracture: Secondary | ICD-10-CM | POA: Diagnosis not present

## 2020-11-11 DIAGNOSIS — S22000A Wedge compression fracture of unspecified thoracic vertebra, initial encounter for closed fracture: Secondary | ICD-10-CM | POA: Diagnosis not present

## 2020-11-11 DIAGNOSIS — S32000A Wedge compression fracture of unspecified lumbar vertebra, initial encounter for closed fracture: Secondary | ICD-10-CM | POA: Diagnosis not present

## 2020-11-11 DIAGNOSIS — S066X0D Traumatic subarachnoid hemorrhage without loss of consciousness, subsequent encounter: Secondary | ICD-10-CM | POA: Diagnosis not present

## 2020-11-11 DIAGNOSIS — G9340 Encephalopathy, unspecified: Secondary | ICD-10-CM | POA: Diagnosis not present

## 2020-11-11 DIAGNOSIS — R29898 Other symptoms and signs involving the musculoskeletal system: Secondary | ICD-10-CM | POA: Diagnosis not present

## 2020-11-12 DIAGNOSIS — S069X9A Unspecified intracranial injury with loss of consciousness of unspecified duration, initial encounter: Secondary | ICD-10-CM | POA: Diagnosis not present

## 2020-11-12 DIAGNOSIS — S065X9A Traumatic subdural hemorrhage with loss of consciousness of unspecified duration, initial encounter: Secondary | ICD-10-CM | POA: Diagnosis not present

## 2020-11-12 DIAGNOSIS — S066X0D Traumatic subarachnoid hemorrhage without loss of consciousness, subsequent encounter: Secondary | ICD-10-CM | POA: Diagnosis not present

## 2020-11-12 DIAGNOSIS — G9340 Encephalopathy, unspecified: Secondary | ICD-10-CM | POA: Diagnosis not present

## 2020-11-12 DIAGNOSIS — I517 Cardiomegaly: Secondary | ICD-10-CM | POA: Diagnosis not present

## 2020-11-12 DIAGNOSIS — I77810 Thoracic aortic ectasia: Secondary | ICD-10-CM | POA: Diagnosis not present

## 2020-11-12 DIAGNOSIS — S02119A Unspecified fracture of occiput, initial encounter for closed fracture: Secondary | ICD-10-CM | POA: Diagnosis not present

## 2020-11-12 DIAGNOSIS — I6200 Nontraumatic subdural hemorrhage, unspecified: Secondary | ICD-10-CM | POA: Diagnosis not present

## 2020-11-12 DIAGNOSIS — S22000A Wedge compression fracture of unspecified thoracic vertebra, initial encounter for closed fracture: Secondary | ICD-10-CM | POA: Diagnosis not present

## 2020-11-12 DIAGNOSIS — S32000A Wedge compression fracture of unspecified lumbar vertebra, initial encounter for closed fracture: Secondary | ICD-10-CM | POA: Diagnosis not present

## 2020-11-13 DIAGNOSIS — S22000A Wedge compression fracture of unspecified thoracic vertebra, initial encounter for closed fracture: Secondary | ICD-10-CM | POA: Diagnosis not present

## 2020-11-13 DIAGNOSIS — S02119A Unspecified fracture of occiput, initial encounter for closed fracture: Secondary | ICD-10-CM | POA: Diagnosis not present

## 2020-11-13 DIAGNOSIS — S065X9A Traumatic subdural hemorrhage with loss of consciousness of unspecified duration, initial encounter: Secondary | ICD-10-CM | POA: Diagnosis not present

## 2020-11-13 DIAGNOSIS — S069X9A Unspecified intracranial injury with loss of consciousness of unspecified duration, initial encounter: Secondary | ICD-10-CM | POA: Diagnosis not present

## 2020-11-13 DIAGNOSIS — S32000A Wedge compression fracture of unspecified lumbar vertebra, initial encounter for closed fracture: Secondary | ICD-10-CM | POA: Diagnosis not present

## 2020-11-13 DIAGNOSIS — S066X0D Traumatic subarachnoid hemorrhage without loss of consciousness, subsequent encounter: Secondary | ICD-10-CM | POA: Diagnosis not present

## 2020-11-14 DIAGNOSIS — S32000A Wedge compression fracture of unspecified lumbar vertebra, initial encounter for closed fracture: Secondary | ICD-10-CM | POA: Diagnosis not present

## 2020-11-14 DIAGNOSIS — S065X9A Traumatic subdural hemorrhage with loss of consciousness of unspecified duration, initial encounter: Secondary | ICD-10-CM | POA: Diagnosis not present

## 2020-11-14 DIAGNOSIS — S066X9A Traumatic subarachnoid hemorrhage with loss of consciousness of unspecified duration, initial encounter: Secondary | ICD-10-CM | POA: Diagnosis not present

## 2020-11-14 DIAGNOSIS — S069X9A Unspecified intracranial injury with loss of consciousness of unspecified duration, initial encounter: Secondary | ICD-10-CM | POA: Diagnosis not present

## 2020-11-14 DIAGNOSIS — S066X0D Traumatic subarachnoid hemorrhage without loss of consciousness, subsequent encounter: Secondary | ICD-10-CM | POA: Diagnosis not present

## 2020-11-14 DIAGNOSIS — S22000A Wedge compression fracture of unspecified thoracic vertebra, initial encounter for closed fracture: Secondary | ICD-10-CM | POA: Diagnosis not present

## 2020-11-14 DIAGNOSIS — S02119A Unspecified fracture of occiput, initial encounter for closed fracture: Secondary | ICD-10-CM | POA: Diagnosis not present

## 2020-11-14 DIAGNOSIS — S0083XA Contusion of other part of head, initial encounter: Secondary | ICD-10-CM | POA: Diagnosis not present

## 2020-11-15 DIAGNOSIS — S22000A Wedge compression fracture of unspecified thoracic vertebra, initial encounter for closed fracture: Secondary | ICD-10-CM | POA: Diagnosis not present

## 2020-11-15 DIAGNOSIS — S069X9A Unspecified intracranial injury with loss of consciousness of unspecified duration, initial encounter: Secondary | ICD-10-CM | POA: Diagnosis not present

## 2020-11-15 DIAGNOSIS — S066X0D Traumatic subarachnoid hemorrhage without loss of consciousness, subsequent encounter: Secondary | ICD-10-CM | POA: Diagnosis not present

## 2020-11-15 DIAGNOSIS — S32000A Wedge compression fracture of unspecified lumbar vertebra, initial encounter for closed fracture: Secondary | ICD-10-CM | POA: Diagnosis not present

## 2020-11-15 DIAGNOSIS — S065X9A Traumatic subdural hemorrhage with loss of consciousness of unspecified duration, initial encounter: Secondary | ICD-10-CM | POA: Diagnosis not present

## 2020-11-15 DIAGNOSIS — S02119A Unspecified fracture of occiput, initial encounter for closed fracture: Secondary | ICD-10-CM | POA: Diagnosis not present

## 2020-11-16 DIAGNOSIS — S066X0D Traumatic subarachnoid hemorrhage without loss of consciousness, subsequent encounter: Secondary | ICD-10-CM | POA: Diagnosis not present

## 2020-11-17 DIAGNOSIS — S32000A Wedge compression fracture of unspecified lumbar vertebra, initial encounter for closed fracture: Secondary | ICD-10-CM | POA: Diagnosis not present

## 2020-11-17 DIAGNOSIS — S02119A Unspecified fracture of occiput, initial encounter for closed fracture: Secondary | ICD-10-CM | POA: Diagnosis not present

## 2020-11-17 DIAGNOSIS — S22000A Wedge compression fracture of unspecified thoracic vertebra, initial encounter for closed fracture: Secondary | ICD-10-CM | POA: Diagnosis not present

## 2020-11-17 DIAGNOSIS — S065X9A Traumatic subdural hemorrhage with loss of consciousness of unspecified duration, initial encounter: Secondary | ICD-10-CM | POA: Diagnosis not present

## 2020-11-17 DIAGNOSIS — S066X0D Traumatic subarachnoid hemorrhage without loss of consciousness, subsequent encounter: Secondary | ICD-10-CM | POA: Diagnosis not present

## 2020-11-17 DIAGNOSIS — S069X9A Unspecified intracranial injury with loss of consciousness of unspecified duration, initial encounter: Secondary | ICD-10-CM | POA: Diagnosis not present

## 2020-11-18 DIAGNOSIS — S22070A Wedge compression fracture of T9-T10 vertebra, initial encounter for closed fracture: Secondary | ICD-10-CM | POA: Diagnosis not present

## 2020-11-18 DIAGNOSIS — M4855XA Collapsed vertebra, not elsewhere classified, thoracolumbar region, initial encounter for fracture: Secondary | ICD-10-CM | POA: Diagnosis not present

## 2020-11-18 DIAGNOSIS — S32010A Wedge compression fracture of first lumbar vertebra, initial encounter for closed fracture: Secondary | ICD-10-CM | POA: Diagnosis not present

## 2020-11-18 DIAGNOSIS — S22080A Wedge compression fracture of T11-T12 vertebra, initial encounter for closed fracture: Secondary | ICD-10-CM | POA: Diagnosis not present

## 2020-11-18 DIAGNOSIS — M4854XA Collapsed vertebra, not elsewhere classified, thoracic region, initial encounter for fracture: Secondary | ICD-10-CM | POA: Diagnosis not present

## 2020-11-19 DIAGNOSIS — S22000D Wedge compression fracture of unspecified thoracic vertebra, subsequent encounter for fracture with routine healing: Secondary | ICD-10-CM | POA: Diagnosis not present

## 2020-11-19 DIAGNOSIS — S066X0D Traumatic subarachnoid hemorrhage without loss of consciousness, subsequent encounter: Secondary | ICD-10-CM | POA: Diagnosis not present

## 2020-11-19 DIAGNOSIS — R1310 Dysphagia, unspecified: Secondary | ICD-10-CM | POA: Diagnosis not present

## 2020-11-19 DIAGNOSIS — S02119A Unspecified fracture of occiput, initial encounter for closed fracture: Secondary | ICD-10-CM | POA: Diagnosis not present

## 2020-11-20 DIAGNOSIS — R1310 Dysphagia, unspecified: Secondary | ICD-10-CM | POA: Diagnosis not present

## 2020-11-20 DIAGNOSIS — S02119A Unspecified fracture of occiput, initial encounter for closed fracture: Secondary | ICD-10-CM | POA: Diagnosis not present

## 2020-11-20 DIAGNOSIS — S066X0D Traumatic subarachnoid hemorrhage without loss of consciousness, subsequent encounter: Secondary | ICD-10-CM | POA: Diagnosis not present

## 2020-11-20 DIAGNOSIS — S22000D Wedge compression fracture of unspecified thoracic vertebra, subsequent encounter for fracture with routine healing: Secondary | ICD-10-CM | POA: Diagnosis not present

## 2020-11-21 DIAGNOSIS — S066X0D Traumatic subarachnoid hemorrhage without loss of consciousness, subsequent encounter: Secondary | ICD-10-CM | POA: Diagnosis not present

## 2020-11-22 DIAGNOSIS — S066X0D Traumatic subarachnoid hemorrhage without loss of consciousness, subsequent encounter: Secondary | ICD-10-CM | POA: Diagnosis not present

## 2020-11-22 DIAGNOSIS — S066X0A Traumatic subarachnoid hemorrhage without loss of consciousness, initial encounter: Secondary | ICD-10-CM | POA: Diagnosis not present

## 2020-11-23 DIAGNOSIS — S02119A Unspecified fracture of occiput, initial encounter for closed fracture: Secondary | ICD-10-CM | POA: Diagnosis not present

## 2020-11-23 DIAGNOSIS — S066X0D Traumatic subarachnoid hemorrhage without loss of consciousness, subsequent encounter: Secondary | ICD-10-CM | POA: Diagnosis not present

## 2020-11-23 DIAGNOSIS — R1310 Dysphagia, unspecified: Secondary | ICD-10-CM | POA: Diagnosis not present

## 2020-11-23 DIAGNOSIS — S22000D Wedge compression fracture of unspecified thoracic vertebra, subsequent encounter for fracture with routine healing: Secondary | ICD-10-CM | POA: Diagnosis not present

## 2020-11-24 DIAGNOSIS — E7849 Other hyperlipidemia: Secondary | ICD-10-CM | POA: Diagnosis not present

## 2020-11-24 DIAGNOSIS — S066X0D Traumatic subarachnoid hemorrhage without loss of consciousness, subsequent encounter: Secondary | ICD-10-CM | POA: Diagnosis not present

## 2020-11-24 DIAGNOSIS — S22000D Wedge compression fracture of unspecified thoracic vertebra, subsequent encounter for fracture with routine healing: Secondary | ICD-10-CM | POA: Diagnosis not present

## 2020-11-24 DIAGNOSIS — H409 Unspecified glaucoma: Secondary | ICD-10-CM | POA: Diagnosis not present

## 2020-11-24 DIAGNOSIS — I10 Essential (primary) hypertension: Secondary | ICD-10-CM | POA: Diagnosis not present

## 2020-11-24 DIAGNOSIS — K219 Gastro-esophageal reflux disease without esophagitis: Secondary | ICD-10-CM | POA: Diagnosis not present

## 2020-11-24 DIAGNOSIS — S02119A Unspecified fracture of occiput, initial encounter for closed fracture: Secondary | ICD-10-CM | POA: Diagnosis not present

## 2020-11-24 DIAGNOSIS — R1310 Dysphagia, unspecified: Secondary | ICD-10-CM | POA: Diagnosis not present

## 2020-11-25 DIAGNOSIS — S066X0D Traumatic subarachnoid hemorrhage without loss of consciousness, subsequent encounter: Secondary | ICD-10-CM | POA: Diagnosis not present

## 2020-11-25 DIAGNOSIS — S22000D Wedge compression fracture of unspecified thoracic vertebra, subsequent encounter for fracture with routine healing: Secondary | ICD-10-CM | POA: Diagnosis not present

## 2020-11-25 DIAGNOSIS — S02119A Unspecified fracture of occiput, initial encounter for closed fracture: Secondary | ICD-10-CM | POA: Diagnosis not present

## 2020-11-25 DIAGNOSIS — R1310 Dysphagia, unspecified: Secondary | ICD-10-CM | POA: Diagnosis not present

## 2020-11-25 DIAGNOSIS — S066X0A Traumatic subarachnoid hemorrhage without loss of consciousness, initial encounter: Secondary | ICD-10-CM | POA: Diagnosis not present

## 2020-11-26 DIAGNOSIS — Z9181 History of falling: Secondary | ICD-10-CM | POA: Diagnosis not present

## 2020-11-26 DIAGNOSIS — Z743 Need for continuous supervision: Secondary | ICD-10-CM | POA: Diagnosis not present

## 2020-11-26 DIAGNOSIS — M6281 Muscle weakness (generalized): Secondary | ICD-10-CM | POA: Diagnosis not present

## 2020-11-26 DIAGNOSIS — R5381 Other malaise: Secondary | ICD-10-CM | POA: Diagnosis not present

## 2020-11-26 DIAGNOSIS — I69091 Dysphagia following nontraumatic subarachnoid hemorrhage: Secondary | ICD-10-CM | POA: Diagnosis not present

## 2020-11-26 DIAGNOSIS — S22070D Wedge compression fracture of T9-T10 vertebra, subsequent encounter for fracture with routine healing: Secondary | ICD-10-CM | POA: Diagnosis not present

## 2020-11-26 DIAGNOSIS — S065X9D Traumatic subdural hemorrhage with loss of consciousness of unspecified duration, subsequent encounter: Secondary | ICD-10-CM | POA: Diagnosis not present

## 2020-11-26 DIAGNOSIS — S066X0D Traumatic subarachnoid hemorrhage without loss of consciousness, subsequent encounter: Secondary | ICD-10-CM | POA: Diagnosis not present

## 2020-11-26 DIAGNOSIS — S22080D Wedge compression fracture of T11-T12 vertebra, subsequent encounter for fracture with routine healing: Secondary | ICD-10-CM | POA: Diagnosis not present

## 2020-11-26 DIAGNOSIS — R279 Unspecified lack of coordination: Secondary | ICD-10-CM | POA: Diagnosis not present

## 2020-11-26 DIAGNOSIS — D509 Iron deficiency anemia, unspecified: Secondary | ICD-10-CM | POA: Diagnosis not present

## 2020-11-26 DIAGNOSIS — I69015 Cognitive social or emotional deficit following nontraumatic subarachnoid hemorrhage: Secondary | ICD-10-CM | POA: Diagnosis not present

## 2020-11-26 DIAGNOSIS — R2689 Other abnormalities of gait and mobility: Secondary | ICD-10-CM | POA: Diagnosis not present

## 2020-11-26 DIAGNOSIS — I609 Nontraumatic subarachnoid hemorrhage, unspecified: Secondary | ICD-10-CM | POA: Diagnosis not present

## 2020-11-27 DIAGNOSIS — S22080D Wedge compression fracture of T11-T12 vertebra, subsequent encounter for fracture with routine healing: Secondary | ICD-10-CM | POA: Diagnosis not present

## 2020-11-27 DIAGNOSIS — I609 Nontraumatic subarachnoid hemorrhage, unspecified: Secondary | ICD-10-CM | POA: Diagnosis not present

## 2020-11-27 DIAGNOSIS — S065X9D Traumatic subdural hemorrhage with loss of consciousness of unspecified duration, subsequent encounter: Secondary | ICD-10-CM | POA: Diagnosis not present

## 2020-12-18 DIAGNOSIS — Z9889 Other specified postprocedural states: Secondary | ICD-10-CM | POA: Diagnosis not present

## 2020-12-18 DIAGNOSIS — Z09 Encounter for follow-up examination after completed treatment for conditions other than malignant neoplasm: Secondary | ICD-10-CM | POA: Diagnosis not present

## 2020-12-18 DIAGNOSIS — M4810 Ankylosing hyperostosis [Forestier], site unspecified: Secondary | ICD-10-CM | POA: Diagnosis not present

## 2020-12-18 DIAGNOSIS — M8588 Other specified disorders of bone density and structure, other site: Secondary | ICD-10-CM | POA: Diagnosis not present

## 2020-12-18 DIAGNOSIS — I161 Hypertensive emergency: Secondary | ICD-10-CM | POA: Diagnosis not present

## 2020-12-19 DIAGNOSIS — Z7189 Other specified counseling: Secondary | ICD-10-CM | POA: Diagnosis not present

## 2020-12-20 DIAGNOSIS — S02119D Unspecified fracture of occiput, subsequent encounter for fracture with routine healing: Secondary | ICD-10-CM | POA: Diagnosis not present

## 2020-12-20 DIAGNOSIS — I10 Essential (primary) hypertension: Secondary | ICD-10-CM | POA: Diagnosis not present

## 2020-12-20 DIAGNOSIS — H3530 Unspecified macular degeneration: Secondary | ICD-10-CM | POA: Diagnosis not present

## 2020-12-20 DIAGNOSIS — S22080D Wedge compression fracture of T11-T12 vertebra, subsequent encounter for fracture with routine healing: Secondary | ICD-10-CM | POA: Diagnosis not present

## 2020-12-20 DIAGNOSIS — S22070D Wedge compression fracture of T9-T10 vertebra, subsequent encounter for fracture with routine healing: Secondary | ICD-10-CM | POA: Diagnosis not present

## 2020-12-20 DIAGNOSIS — D509 Iron deficiency anemia, unspecified: Secondary | ICD-10-CM | POA: Diagnosis not present

## 2020-12-20 DIAGNOSIS — K5900 Constipation, unspecified: Secondary | ICD-10-CM | POA: Diagnosis not present

## 2020-12-20 DIAGNOSIS — M109 Gout, unspecified: Secondary | ICD-10-CM | POA: Diagnosis not present

## 2020-12-20 DIAGNOSIS — S066X0D Traumatic subarachnoid hemorrhage without loss of consciousness, subsequent encounter: Secondary | ICD-10-CM | POA: Diagnosis not present

## 2020-12-27 DIAGNOSIS — S22070D Wedge compression fracture of T9-T10 vertebra, subsequent encounter for fracture with routine healing: Secondary | ICD-10-CM | POA: Diagnosis not present

## 2020-12-27 DIAGNOSIS — H3530 Unspecified macular degeneration: Secondary | ICD-10-CM | POA: Diagnosis not present

## 2020-12-27 DIAGNOSIS — S22080D Wedge compression fracture of T11-T12 vertebra, subsequent encounter for fracture with routine healing: Secondary | ICD-10-CM | POA: Diagnosis not present

## 2020-12-27 DIAGNOSIS — S02119D Unspecified fracture of occiput, subsequent encounter for fracture with routine healing: Secondary | ICD-10-CM | POA: Diagnosis not present

## 2020-12-27 DIAGNOSIS — K5900 Constipation, unspecified: Secondary | ICD-10-CM | POA: Diagnosis not present

## 2020-12-27 DIAGNOSIS — D509 Iron deficiency anemia, unspecified: Secondary | ICD-10-CM | POA: Diagnosis not present

## 2020-12-27 DIAGNOSIS — M109 Gout, unspecified: Secondary | ICD-10-CM | POA: Diagnosis not present

## 2020-12-27 DIAGNOSIS — S066X0D Traumatic subarachnoid hemorrhage without loss of consciousness, subsequent encounter: Secondary | ICD-10-CM | POA: Diagnosis not present

## 2020-12-27 DIAGNOSIS — I10 Essential (primary) hypertension: Secondary | ICD-10-CM | POA: Diagnosis not present

## 2020-12-30 DIAGNOSIS — K5730 Diverticulosis of large intestine without perforation or abscess without bleeding: Secondary | ICD-10-CM | POA: Diagnosis not present

## 2020-12-30 DIAGNOSIS — N433 Hydrocele, unspecified: Secondary | ICD-10-CM | POA: Diagnosis not present

## 2020-12-30 DIAGNOSIS — Z7982 Long term (current) use of aspirin: Secondary | ICD-10-CM | POA: Diagnosis not present

## 2020-12-30 DIAGNOSIS — I700 Atherosclerosis of aorta: Secondary | ICD-10-CM | POA: Diagnosis not present

## 2020-12-30 DIAGNOSIS — N400 Enlarged prostate without lower urinary tract symptoms: Secondary | ICD-10-CM | POA: Diagnosis not present

## 2020-12-30 DIAGNOSIS — Z79899 Other long term (current) drug therapy: Secondary | ICD-10-CM | POA: Diagnosis not present

## 2020-12-30 DIAGNOSIS — I10 Essential (primary) hypertension: Secondary | ICD-10-CM | POA: Diagnosis not present

## 2020-12-30 DIAGNOSIS — N401 Enlarged prostate with lower urinary tract symptoms: Secondary | ICD-10-CM | POA: Diagnosis not present

## 2020-12-30 DIAGNOSIS — N200 Calculus of kidney: Secondary | ICD-10-CM | POA: Diagnosis not present

## 2020-12-30 DIAGNOSIS — F17200 Nicotine dependence, unspecified, uncomplicated: Secondary | ICD-10-CM | POA: Diagnosis not present

## 2020-12-30 DIAGNOSIS — R600 Localized edema: Secondary | ICD-10-CM | POA: Diagnosis not present

## 2020-12-30 DIAGNOSIS — M7989 Other specified soft tissue disorders: Secondary | ICD-10-CM | POA: Diagnosis not present

## 2020-12-31 DIAGNOSIS — S02119D Unspecified fracture of occiput, subsequent encounter for fracture with routine healing: Secondary | ICD-10-CM | POA: Diagnosis not present

## 2020-12-31 DIAGNOSIS — M109 Gout, unspecified: Secondary | ICD-10-CM | POA: Diagnosis not present

## 2020-12-31 DIAGNOSIS — S22070D Wedge compression fracture of T9-T10 vertebra, subsequent encounter for fracture with routine healing: Secondary | ICD-10-CM | POA: Diagnosis not present

## 2020-12-31 DIAGNOSIS — K5900 Constipation, unspecified: Secondary | ICD-10-CM | POA: Diagnosis not present

## 2020-12-31 DIAGNOSIS — S066X0D Traumatic subarachnoid hemorrhage without loss of consciousness, subsequent encounter: Secondary | ICD-10-CM | POA: Diagnosis not present

## 2020-12-31 DIAGNOSIS — D509 Iron deficiency anemia, unspecified: Secondary | ICD-10-CM | POA: Diagnosis not present

## 2020-12-31 DIAGNOSIS — I10 Essential (primary) hypertension: Secondary | ICD-10-CM | POA: Diagnosis not present

## 2020-12-31 DIAGNOSIS — S22080D Wedge compression fracture of T11-T12 vertebra, subsequent encounter for fracture with routine healing: Secondary | ICD-10-CM | POA: Diagnosis not present

## 2020-12-31 DIAGNOSIS — H3530 Unspecified macular degeneration: Secondary | ICD-10-CM | POA: Diagnosis not present

## 2021-01-01 DIAGNOSIS — S066X0D Traumatic subarachnoid hemorrhage without loss of consciousness, subsequent encounter: Secondary | ICD-10-CM | POA: Diagnosis not present

## 2021-01-01 DIAGNOSIS — H3530 Unspecified macular degeneration: Secondary | ICD-10-CM | POA: Diagnosis not present

## 2021-01-01 DIAGNOSIS — I10 Essential (primary) hypertension: Secondary | ICD-10-CM | POA: Diagnosis not present

## 2021-01-01 DIAGNOSIS — S22070D Wedge compression fracture of T9-T10 vertebra, subsequent encounter for fracture with routine healing: Secondary | ICD-10-CM | POA: Diagnosis not present

## 2021-01-01 DIAGNOSIS — M109 Gout, unspecified: Secondary | ICD-10-CM | POA: Diagnosis not present

## 2021-01-01 DIAGNOSIS — D509 Iron deficiency anemia, unspecified: Secondary | ICD-10-CM | POA: Diagnosis not present

## 2021-01-01 DIAGNOSIS — S02119D Unspecified fracture of occiput, subsequent encounter for fracture with routine healing: Secondary | ICD-10-CM | POA: Diagnosis not present

## 2021-01-01 DIAGNOSIS — K5900 Constipation, unspecified: Secondary | ICD-10-CM | POA: Diagnosis not present

## 2021-01-01 DIAGNOSIS — S22080D Wedge compression fracture of T11-T12 vertebra, subsequent encounter for fracture with routine healing: Secondary | ICD-10-CM | POA: Diagnosis not present

## 2021-01-02 DIAGNOSIS — S22078D Other fracture of T9-T10 vertebra, subsequent encounter for fracture with routine healing: Secondary | ICD-10-CM | POA: Diagnosis not present

## 2021-01-02 DIAGNOSIS — R600 Localized edema: Secondary | ICD-10-CM | POA: Diagnosis not present

## 2021-01-02 DIAGNOSIS — R5381 Other malaise: Secondary | ICD-10-CM | POA: Diagnosis not present

## 2021-01-02 DIAGNOSIS — R69 Illness, unspecified: Secondary | ICD-10-CM | POA: Diagnosis not present

## 2021-01-02 DIAGNOSIS — S22088D Other fracture of T11-T12 vertebra, subsequent encounter for fracture with routine healing: Secondary | ICD-10-CM | POA: Diagnosis not present

## 2021-01-02 DIAGNOSIS — M47819 Spondylosis without myelopathy or radiculopathy, site unspecified: Secondary | ICD-10-CM | POA: Diagnosis not present

## 2021-01-02 DIAGNOSIS — S065X9D Traumatic subdural hemorrhage with loss of consciousness of unspecified duration, subsequent encounter: Secondary | ICD-10-CM | POA: Diagnosis not present

## 2021-01-02 DIAGNOSIS — S32018D Other fracture of first lumbar vertebra, subsequent encounter for fracture with routine healing: Secondary | ICD-10-CM | POA: Diagnosis not present

## 2021-01-07 DIAGNOSIS — H3530 Unspecified macular degeneration: Secondary | ICD-10-CM | POA: Diagnosis not present

## 2021-01-07 DIAGNOSIS — S22070D Wedge compression fracture of T9-T10 vertebra, subsequent encounter for fracture with routine healing: Secondary | ICD-10-CM | POA: Diagnosis not present

## 2021-01-07 DIAGNOSIS — K5900 Constipation, unspecified: Secondary | ICD-10-CM | POA: Diagnosis not present

## 2021-01-07 DIAGNOSIS — I10 Essential (primary) hypertension: Secondary | ICD-10-CM | POA: Diagnosis not present

## 2021-01-07 DIAGNOSIS — S22080D Wedge compression fracture of T11-T12 vertebra, subsequent encounter for fracture with routine healing: Secondary | ICD-10-CM | POA: Diagnosis not present

## 2021-01-07 DIAGNOSIS — D509 Iron deficiency anemia, unspecified: Secondary | ICD-10-CM | POA: Diagnosis not present

## 2021-01-07 DIAGNOSIS — S066X0D Traumatic subarachnoid hemorrhage without loss of consciousness, subsequent encounter: Secondary | ICD-10-CM | POA: Diagnosis not present

## 2021-01-07 DIAGNOSIS — S02119D Unspecified fracture of occiput, subsequent encounter for fracture with routine healing: Secondary | ICD-10-CM | POA: Diagnosis not present

## 2021-01-07 DIAGNOSIS — M109 Gout, unspecified: Secondary | ICD-10-CM | POA: Diagnosis not present

## 2021-01-08 DIAGNOSIS — S22070D Wedge compression fracture of T9-T10 vertebra, subsequent encounter for fracture with routine healing: Secondary | ICD-10-CM | POA: Diagnosis not present

## 2021-01-08 DIAGNOSIS — I10 Essential (primary) hypertension: Secondary | ICD-10-CM | POA: Diagnosis not present

## 2021-01-08 DIAGNOSIS — S22080D Wedge compression fracture of T11-T12 vertebra, subsequent encounter for fracture with routine healing: Secondary | ICD-10-CM | POA: Diagnosis not present

## 2021-01-08 DIAGNOSIS — H3530 Unspecified macular degeneration: Secondary | ICD-10-CM | POA: Diagnosis not present

## 2021-01-08 DIAGNOSIS — S02119D Unspecified fracture of occiput, subsequent encounter for fracture with routine healing: Secondary | ICD-10-CM | POA: Diagnosis not present

## 2021-01-08 DIAGNOSIS — D509 Iron deficiency anemia, unspecified: Secondary | ICD-10-CM | POA: Diagnosis not present

## 2021-01-08 DIAGNOSIS — S066X0D Traumatic subarachnoid hemorrhage without loss of consciousness, subsequent encounter: Secondary | ICD-10-CM | POA: Diagnosis not present

## 2021-01-08 DIAGNOSIS — K5900 Constipation, unspecified: Secondary | ICD-10-CM | POA: Diagnosis not present

## 2021-01-08 DIAGNOSIS — M109 Gout, unspecified: Secondary | ICD-10-CM | POA: Diagnosis not present

## 2021-01-14 DIAGNOSIS — Z6832 Body mass index (BMI) 32.0-32.9, adult: Secondary | ICD-10-CM | POA: Diagnosis not present

## 2021-01-14 DIAGNOSIS — S065X9A Traumatic subdural hemorrhage with loss of consciousness of unspecified duration, initial encounter: Secondary | ICD-10-CM | POA: Diagnosis not present

## 2021-01-14 DIAGNOSIS — S22000A Wedge compression fracture of unspecified thoracic vertebra, initial encounter for closed fracture: Secondary | ICD-10-CM | POA: Diagnosis not present

## 2021-01-15 DIAGNOSIS — I10 Essential (primary) hypertension: Secondary | ICD-10-CM | POA: Diagnosis not present

## 2021-01-15 DIAGNOSIS — Z9889 Other specified postprocedural states: Secondary | ICD-10-CM | POA: Diagnosis not present

## 2021-01-15 DIAGNOSIS — M25551 Pain in right hip: Secondary | ICD-10-CM | POA: Diagnosis not present

## 2021-01-15 DIAGNOSIS — S22080D Wedge compression fracture of T11-T12 vertebra, subsequent encounter for fracture with routine healing: Secondary | ICD-10-CM | POA: Diagnosis not present

## 2021-01-15 DIAGNOSIS — M109 Gout, unspecified: Secondary | ICD-10-CM | POA: Diagnosis not present

## 2021-01-15 DIAGNOSIS — Z8679 Personal history of other diseases of the circulatory system: Secondary | ICD-10-CM | POA: Diagnosis not present

## 2021-01-15 DIAGNOSIS — D509 Iron deficiency anemia, unspecified: Secondary | ICD-10-CM | POA: Diagnosis not present

## 2021-01-15 DIAGNOSIS — R413 Other amnesia: Secondary | ICD-10-CM | POA: Diagnosis not present

## 2021-01-15 DIAGNOSIS — H3530 Unspecified macular degeneration: Secondary | ICD-10-CM | POA: Diagnosis not present

## 2021-01-15 DIAGNOSIS — S066X0D Traumatic subarachnoid hemorrhage without loss of consciousness, subsequent encounter: Secondary | ICD-10-CM | POA: Diagnosis not present

## 2021-01-15 DIAGNOSIS — M25552 Pain in left hip: Secondary | ICD-10-CM | POA: Diagnosis not present

## 2021-01-15 DIAGNOSIS — S22070D Wedge compression fracture of T9-T10 vertebra, subsequent encounter for fracture with routine healing: Secondary | ICD-10-CM | POA: Diagnosis not present

## 2021-01-15 DIAGNOSIS — K5900 Constipation, unspecified: Secondary | ICD-10-CM | POA: Diagnosis not present

## 2021-01-15 DIAGNOSIS — S02119D Unspecified fracture of occiput, subsequent encounter for fracture with routine healing: Secondary | ICD-10-CM | POA: Diagnosis not present

## 2021-01-16 DIAGNOSIS — F17210 Nicotine dependence, cigarettes, uncomplicated: Secondary | ICD-10-CM | POA: Diagnosis not present

## 2021-01-16 DIAGNOSIS — S22080D Wedge compression fracture of T11-T12 vertebra, subsequent encounter for fracture with routine healing: Secondary | ICD-10-CM | POA: Diagnosis not present

## 2021-01-16 DIAGNOSIS — Z6833 Body mass index (BMI) 33.0-33.9, adult: Secondary | ICD-10-CM | POA: Diagnosis not present

## 2021-01-16 DIAGNOSIS — M109 Gout, unspecified: Secondary | ICD-10-CM | POA: Diagnosis not present

## 2021-01-16 DIAGNOSIS — S22070D Wedge compression fracture of T9-T10 vertebra, subsequent encounter for fracture with routine healing: Secondary | ICD-10-CM | POA: Diagnosis not present

## 2021-01-16 DIAGNOSIS — H3530 Unspecified macular degeneration: Secondary | ICD-10-CM | POA: Diagnosis not present

## 2021-01-16 DIAGNOSIS — K5900 Constipation, unspecified: Secondary | ICD-10-CM | POA: Diagnosis not present

## 2021-01-16 DIAGNOSIS — S02119D Unspecified fracture of occiput, subsequent encounter for fracture with routine healing: Secondary | ICD-10-CM | POA: Diagnosis not present

## 2021-01-16 DIAGNOSIS — I10 Essential (primary) hypertension: Secondary | ICD-10-CM | POA: Diagnosis not present

## 2021-01-16 DIAGNOSIS — S066X0D Traumatic subarachnoid hemorrhage without loss of consciousness, subsequent encounter: Secondary | ICD-10-CM | POA: Diagnosis not present

## 2021-01-16 DIAGNOSIS — L209 Atopic dermatitis, unspecified: Secondary | ICD-10-CM | POA: Diagnosis not present

## 2021-01-16 DIAGNOSIS — R609 Edema, unspecified: Secondary | ICD-10-CM | POA: Diagnosis not present

## 2021-01-16 DIAGNOSIS — D509 Iron deficiency anemia, unspecified: Secondary | ICD-10-CM | POA: Diagnosis not present

## 2021-01-19 DIAGNOSIS — K5900 Constipation, unspecified: Secondary | ICD-10-CM | POA: Diagnosis not present

## 2021-01-19 DIAGNOSIS — S066X0D Traumatic subarachnoid hemorrhage without loss of consciousness, subsequent encounter: Secondary | ICD-10-CM | POA: Diagnosis not present

## 2021-01-19 DIAGNOSIS — H3530 Unspecified macular degeneration: Secondary | ICD-10-CM | POA: Diagnosis not present

## 2021-01-19 DIAGNOSIS — I10 Essential (primary) hypertension: Secondary | ICD-10-CM | POA: Diagnosis not present

## 2021-01-19 DIAGNOSIS — S22070D Wedge compression fracture of T9-T10 vertebra, subsequent encounter for fracture with routine healing: Secondary | ICD-10-CM | POA: Diagnosis not present

## 2021-01-19 DIAGNOSIS — M109 Gout, unspecified: Secondary | ICD-10-CM | POA: Diagnosis not present

## 2021-01-19 DIAGNOSIS — S22080D Wedge compression fracture of T11-T12 vertebra, subsequent encounter for fracture with routine healing: Secondary | ICD-10-CM | POA: Diagnosis not present

## 2021-01-19 DIAGNOSIS — S02119D Unspecified fracture of occiput, subsequent encounter for fracture with routine healing: Secondary | ICD-10-CM | POA: Diagnosis not present

## 2021-01-19 DIAGNOSIS — D509 Iron deficiency anemia, unspecified: Secondary | ICD-10-CM | POA: Diagnosis not present

## 2021-01-22 DIAGNOSIS — H3530 Unspecified macular degeneration: Secondary | ICD-10-CM | POA: Diagnosis not present

## 2021-01-22 DIAGNOSIS — D509 Iron deficiency anemia, unspecified: Secondary | ICD-10-CM | POA: Diagnosis not present

## 2021-01-22 DIAGNOSIS — S22070D Wedge compression fracture of T9-T10 vertebra, subsequent encounter for fracture with routine healing: Secondary | ICD-10-CM | POA: Diagnosis not present

## 2021-01-22 DIAGNOSIS — S22080D Wedge compression fracture of T11-T12 vertebra, subsequent encounter for fracture with routine healing: Secondary | ICD-10-CM | POA: Diagnosis not present

## 2021-01-22 DIAGNOSIS — S066X0D Traumatic subarachnoid hemorrhage without loss of consciousness, subsequent encounter: Secondary | ICD-10-CM | POA: Diagnosis not present

## 2021-01-22 DIAGNOSIS — S02119D Unspecified fracture of occiput, subsequent encounter for fracture with routine healing: Secondary | ICD-10-CM | POA: Diagnosis not present

## 2021-01-22 DIAGNOSIS — M109 Gout, unspecified: Secondary | ICD-10-CM | POA: Diagnosis not present

## 2021-01-22 DIAGNOSIS — I10 Essential (primary) hypertension: Secondary | ICD-10-CM | POA: Diagnosis not present

## 2021-01-22 DIAGNOSIS — K5900 Constipation, unspecified: Secondary | ICD-10-CM | POA: Diagnosis not present

## 2021-01-23 DIAGNOSIS — N3941 Urge incontinence: Secondary | ICD-10-CM | POA: Diagnosis not present

## 2021-01-23 DIAGNOSIS — N5082 Scrotal pain: Secondary | ICD-10-CM | POA: Diagnosis not present

## 2021-01-24 DIAGNOSIS — E7849 Other hyperlipidemia: Secondary | ICD-10-CM | POA: Diagnosis not present

## 2021-01-24 DIAGNOSIS — I10 Essential (primary) hypertension: Secondary | ICD-10-CM | POA: Diagnosis not present

## 2021-01-24 DIAGNOSIS — H409 Unspecified glaucoma: Secondary | ICD-10-CM | POA: Diagnosis not present

## 2021-01-24 DIAGNOSIS — K219 Gastro-esophageal reflux disease without esophagitis: Secondary | ICD-10-CM | POA: Diagnosis not present

## 2021-01-25 DIAGNOSIS — H401132 Primary open-angle glaucoma, bilateral, moderate stage: Secondary | ICD-10-CM | POA: Diagnosis not present

## 2021-01-25 DIAGNOSIS — H353122 Nonexudative age-related macular degeneration, left eye, intermediate dry stage: Secondary | ICD-10-CM | POA: Diagnosis not present

## 2021-01-29 DIAGNOSIS — D509 Iron deficiency anemia, unspecified: Secondary | ICD-10-CM | POA: Diagnosis not present

## 2021-01-29 DIAGNOSIS — S066X0D Traumatic subarachnoid hemorrhage without loss of consciousness, subsequent encounter: Secondary | ICD-10-CM | POA: Diagnosis not present

## 2021-01-29 DIAGNOSIS — H3530 Unspecified macular degeneration: Secondary | ICD-10-CM | POA: Diagnosis not present

## 2021-01-29 DIAGNOSIS — K5900 Constipation, unspecified: Secondary | ICD-10-CM | POA: Diagnosis not present

## 2021-01-29 DIAGNOSIS — M109 Gout, unspecified: Secondary | ICD-10-CM | POA: Diagnosis not present

## 2021-01-29 DIAGNOSIS — S02119D Unspecified fracture of occiput, subsequent encounter for fracture with routine healing: Secondary | ICD-10-CM | POA: Diagnosis not present

## 2021-01-29 DIAGNOSIS — I10 Essential (primary) hypertension: Secondary | ICD-10-CM | POA: Diagnosis not present

## 2021-01-29 DIAGNOSIS — S22070D Wedge compression fracture of T9-T10 vertebra, subsequent encounter for fracture with routine healing: Secondary | ICD-10-CM | POA: Diagnosis not present

## 2021-01-29 DIAGNOSIS — S22080D Wedge compression fracture of T11-T12 vertebra, subsequent encounter for fracture with routine healing: Secondary | ICD-10-CM | POA: Diagnosis not present

## 2021-02-01 DIAGNOSIS — D509 Iron deficiency anemia, unspecified: Secondary | ICD-10-CM | POA: Diagnosis not present

## 2021-02-01 DIAGNOSIS — K5900 Constipation, unspecified: Secondary | ICD-10-CM | POA: Diagnosis not present

## 2021-02-01 DIAGNOSIS — H3530 Unspecified macular degeneration: Secondary | ICD-10-CM | POA: Diagnosis not present

## 2021-02-01 DIAGNOSIS — S02119D Unspecified fracture of occiput, subsequent encounter for fracture with routine healing: Secondary | ICD-10-CM | POA: Diagnosis not present

## 2021-02-01 DIAGNOSIS — S22080D Wedge compression fracture of T11-T12 vertebra, subsequent encounter for fracture with routine healing: Secondary | ICD-10-CM | POA: Diagnosis not present

## 2021-02-01 DIAGNOSIS — I10 Essential (primary) hypertension: Secondary | ICD-10-CM | POA: Diagnosis not present

## 2021-02-01 DIAGNOSIS — S22070D Wedge compression fracture of T9-T10 vertebra, subsequent encounter for fracture with routine healing: Secondary | ICD-10-CM | POA: Diagnosis not present

## 2021-02-01 DIAGNOSIS — S066X0D Traumatic subarachnoid hemorrhage without loss of consciousness, subsequent encounter: Secondary | ICD-10-CM | POA: Diagnosis not present

## 2021-02-01 DIAGNOSIS — M109 Gout, unspecified: Secondary | ICD-10-CM | POA: Diagnosis not present

## 2021-02-06 DIAGNOSIS — I82431 Acute embolism and thrombosis of right popliteal vein: Secondary | ICD-10-CM | POA: Diagnosis not present

## 2021-02-06 DIAGNOSIS — R600 Localized edema: Secondary | ICD-10-CM | POA: Diagnosis not present

## 2021-02-06 DIAGNOSIS — F17210 Nicotine dependence, cigarettes, uncomplicated: Secondary | ICD-10-CM | POA: Diagnosis not present

## 2021-02-06 DIAGNOSIS — I82401 Acute embolism and thrombosis of unspecified deep veins of right lower extremity: Secondary | ICD-10-CM | POA: Diagnosis not present

## 2021-02-06 DIAGNOSIS — Z6834 Body mass index (BMI) 34.0-34.9, adult: Secondary | ICD-10-CM | POA: Diagnosis not present

## 2021-02-06 DIAGNOSIS — M7989 Other specified soft tissue disorders: Secondary | ICD-10-CM | POA: Diagnosis not present

## 2021-02-07 DIAGNOSIS — H3530 Unspecified macular degeneration: Secondary | ICD-10-CM | POA: Diagnosis not present

## 2021-02-07 DIAGNOSIS — K5900 Constipation, unspecified: Secondary | ICD-10-CM | POA: Diagnosis not present

## 2021-02-07 DIAGNOSIS — S22070D Wedge compression fracture of T9-T10 vertebra, subsequent encounter for fracture with routine healing: Secondary | ICD-10-CM | POA: Diagnosis not present

## 2021-02-07 DIAGNOSIS — I10 Essential (primary) hypertension: Secondary | ICD-10-CM | POA: Diagnosis not present

## 2021-02-07 DIAGNOSIS — S066X0D Traumatic subarachnoid hemorrhage without loss of consciousness, subsequent encounter: Secondary | ICD-10-CM | POA: Diagnosis not present

## 2021-02-07 DIAGNOSIS — D509 Iron deficiency anemia, unspecified: Secondary | ICD-10-CM | POA: Diagnosis not present

## 2021-02-07 DIAGNOSIS — M109 Gout, unspecified: Secondary | ICD-10-CM | POA: Diagnosis not present

## 2021-02-07 DIAGNOSIS — S22080D Wedge compression fracture of T11-T12 vertebra, subsequent encounter for fracture with routine healing: Secondary | ICD-10-CM | POA: Diagnosis not present

## 2021-02-07 DIAGNOSIS — S02119D Unspecified fracture of occiput, subsequent encounter for fracture with routine healing: Secondary | ICD-10-CM | POA: Diagnosis not present

## 2021-02-12 DIAGNOSIS — H3530 Unspecified macular degeneration: Secondary | ICD-10-CM | POA: Diagnosis not present

## 2021-02-12 DIAGNOSIS — S22080D Wedge compression fracture of T11-T12 vertebra, subsequent encounter for fracture with routine healing: Secondary | ICD-10-CM | POA: Diagnosis not present

## 2021-02-12 DIAGNOSIS — I10 Essential (primary) hypertension: Secondary | ICD-10-CM | POA: Diagnosis not present

## 2021-02-12 DIAGNOSIS — S066X0D Traumatic subarachnoid hemorrhage without loss of consciousness, subsequent encounter: Secondary | ICD-10-CM | POA: Diagnosis not present

## 2021-02-12 DIAGNOSIS — M109 Gout, unspecified: Secondary | ICD-10-CM | POA: Diagnosis not present

## 2021-02-12 DIAGNOSIS — S22070D Wedge compression fracture of T9-T10 vertebra, subsequent encounter for fracture with routine healing: Secondary | ICD-10-CM | POA: Diagnosis not present

## 2021-02-12 DIAGNOSIS — D509 Iron deficiency anemia, unspecified: Secondary | ICD-10-CM | POA: Diagnosis not present

## 2021-02-12 DIAGNOSIS — K5900 Constipation, unspecified: Secondary | ICD-10-CM | POA: Diagnosis not present

## 2021-02-12 DIAGNOSIS — S02119D Unspecified fracture of occiput, subsequent encounter for fracture with routine healing: Secondary | ICD-10-CM | POA: Diagnosis not present

## 2021-02-15 ENCOUNTER — Other Ambulatory Visit: Payer: Self-pay | Admitting: Urology

## 2021-02-15 DIAGNOSIS — S22070D Wedge compression fracture of T9-T10 vertebra, subsequent encounter for fracture with routine healing: Secondary | ICD-10-CM | POA: Diagnosis not present

## 2021-02-15 DIAGNOSIS — S02119D Unspecified fracture of occiput, subsequent encounter for fracture with routine healing: Secondary | ICD-10-CM | POA: Diagnosis not present

## 2021-02-15 DIAGNOSIS — D509 Iron deficiency anemia, unspecified: Secondary | ICD-10-CM | POA: Diagnosis not present

## 2021-02-15 DIAGNOSIS — N5082 Scrotal pain: Secondary | ICD-10-CM

## 2021-02-15 DIAGNOSIS — S22080D Wedge compression fracture of T11-T12 vertebra, subsequent encounter for fracture with routine healing: Secondary | ICD-10-CM | POA: Diagnosis not present

## 2021-02-15 DIAGNOSIS — K5900 Constipation, unspecified: Secondary | ICD-10-CM | POA: Diagnosis not present

## 2021-02-15 DIAGNOSIS — S066X0D Traumatic subarachnoid hemorrhage without loss of consciousness, subsequent encounter: Secondary | ICD-10-CM | POA: Diagnosis not present

## 2021-02-15 DIAGNOSIS — H3530 Unspecified macular degeneration: Secondary | ICD-10-CM | POA: Diagnosis not present

## 2021-02-15 DIAGNOSIS — I10 Essential (primary) hypertension: Secondary | ICD-10-CM | POA: Diagnosis not present

## 2021-02-15 DIAGNOSIS — M109 Gout, unspecified: Secondary | ICD-10-CM | POA: Diagnosis not present

## 2021-02-21 ENCOUNTER — Ambulatory Visit
Admission: RE | Admit: 2021-02-21 | Discharge: 2021-02-21 | Disposition: A | Discharge status: Home / Self Care | Payer: Medicare HMO | Source: Ambulatory Visit | Attending: Urology | Admitting: Urology

## 2021-02-21 DIAGNOSIS — E039 Hypothyroidism, unspecified: Secondary | ICD-10-CM | POA: Diagnosis not present

## 2021-02-21 DIAGNOSIS — N433 Hydrocele, unspecified: Secondary | ICD-10-CM | POA: Diagnosis not present

## 2021-02-21 DIAGNOSIS — N5082 Scrotal pain: Secondary | ICD-10-CM | POA: Diagnosis not present

## 2021-02-21 DIAGNOSIS — E041 Nontoxic single thyroid nodule: Secondary | ICD-10-CM | POA: Diagnosis not present

## 2021-02-21 DIAGNOSIS — I10 Essential (primary) hypertension: Secondary | ICD-10-CM | POA: Diagnosis not present

## 2021-02-21 DIAGNOSIS — K219 Gastro-esophageal reflux disease without esophagitis: Secondary | ICD-10-CM | POA: Diagnosis not present

## 2021-02-21 DIAGNOSIS — E7849 Other hyperlipidemia: Secondary | ICD-10-CM | POA: Diagnosis not present

## 2021-02-21 DIAGNOSIS — E782 Mixed hyperlipidemia: Secondary | ICD-10-CM | POA: Diagnosis not present

## 2021-02-21 DIAGNOSIS — N5089 Other specified disorders of the male genital organs: Secondary | ICD-10-CM | POA: Diagnosis not present

## 2021-02-21 DIAGNOSIS — E1165 Type 2 diabetes mellitus with hyperglycemia: Secondary | ICD-10-CM | POA: Diagnosis not present

## 2021-02-21 DIAGNOSIS — F17200 Nicotine dependence, unspecified, uncomplicated: Secondary | ICD-10-CM | POA: Diagnosis not present

## 2021-02-24 DIAGNOSIS — E7849 Other hyperlipidemia: Secondary | ICD-10-CM | POA: Diagnosis not present

## 2021-02-24 DIAGNOSIS — H409 Unspecified glaucoma: Secondary | ICD-10-CM | POA: Diagnosis not present

## 2021-02-24 DIAGNOSIS — K219 Gastro-esophageal reflux disease without esophagitis: Secondary | ICD-10-CM | POA: Diagnosis not present

## 2021-02-24 DIAGNOSIS — I10 Essential (primary) hypertension: Secondary | ICD-10-CM | POA: Diagnosis not present

## 2021-02-25 DIAGNOSIS — E7849 Other hyperlipidemia: Secondary | ICD-10-CM | POA: Diagnosis not present

## 2021-02-25 DIAGNOSIS — G4733 Obstructive sleep apnea (adult) (pediatric): Secondary | ICD-10-CM | POA: Diagnosis not present

## 2021-02-25 DIAGNOSIS — F331 Major depressive disorder, recurrent, moderate: Secondary | ICD-10-CM | POA: Diagnosis not present

## 2021-02-25 DIAGNOSIS — F17210 Nicotine dependence, cigarettes, uncomplicated: Secondary | ICD-10-CM | POA: Diagnosis not present

## 2021-02-25 DIAGNOSIS — I10 Essential (primary) hypertension: Secondary | ICD-10-CM | POA: Diagnosis not present

## 2021-02-25 DIAGNOSIS — J439 Emphysema, unspecified: Secondary | ICD-10-CM | POA: Diagnosis not present

## 2021-02-25 DIAGNOSIS — I700 Atherosclerosis of aorta: Secondary | ICD-10-CM | POA: Diagnosis not present

## 2021-02-25 DIAGNOSIS — D509 Iron deficiency anemia, unspecified: Secondary | ICD-10-CM | POA: Diagnosis not present

## 2021-02-26 ENCOUNTER — Other Ambulatory Visit: Payer: Medicare HMO

## 2021-03-19 DIAGNOSIS — R8279 Other abnormal findings on microbiological examination of urine: Secondary | ICD-10-CM | POA: Diagnosis not present

## 2021-03-19 DIAGNOSIS — N139 Obstructive and reflux uropathy, unspecified: Secondary | ICD-10-CM | POA: Diagnosis not present

## 2021-03-27 DIAGNOSIS — E7849 Other hyperlipidemia: Secondary | ICD-10-CM | POA: Diagnosis not present

## 2021-03-27 DIAGNOSIS — I10 Essential (primary) hypertension: Secondary | ICD-10-CM | POA: Diagnosis not present

## 2021-03-27 DIAGNOSIS — K219 Gastro-esophageal reflux disease without esophagitis: Secondary | ICD-10-CM | POA: Diagnosis not present

## 2021-03-27 DIAGNOSIS — H409 Unspecified glaucoma: Secondary | ICD-10-CM | POA: Diagnosis not present

## 2021-04-03 DIAGNOSIS — E782 Mixed hyperlipidemia: Secondary | ICD-10-CM | POA: Diagnosis not present

## 2021-04-03 DIAGNOSIS — I10 Essential (primary) hypertension: Secondary | ICD-10-CM | POA: Diagnosis not present

## 2021-04-03 DIAGNOSIS — I700 Atherosclerosis of aorta: Secondary | ICD-10-CM | POA: Diagnosis not present

## 2021-04-03 DIAGNOSIS — Z23 Encounter for immunization: Secondary | ICD-10-CM | POA: Diagnosis not present

## 2021-04-03 DIAGNOSIS — Z1212 Encounter for screening for malignant neoplasm of rectum: Secondary | ICD-10-CM | POA: Diagnosis not present

## 2021-04-03 DIAGNOSIS — E7849 Other hyperlipidemia: Secondary | ICD-10-CM | POA: Diagnosis not present

## 2021-04-03 DIAGNOSIS — F331 Major depressive disorder, recurrent, moderate: Secondary | ICD-10-CM | POA: Diagnosis not present

## 2021-04-03 DIAGNOSIS — F17210 Nicotine dependence, cigarettes, uncomplicated: Secondary | ICD-10-CM | POA: Diagnosis not present

## 2021-04-03 DIAGNOSIS — G4733 Obstructive sleep apnea (adult) (pediatric): Secondary | ICD-10-CM | POA: Diagnosis not present

## 2021-04-03 DIAGNOSIS — Z9189 Other specified personal risk factors, not elsewhere classified: Secondary | ICD-10-CM | POA: Diagnosis not present

## 2021-04-03 DIAGNOSIS — J439 Emphysema, unspecified: Secondary | ICD-10-CM | POA: Diagnosis not present

## 2021-04-03 DIAGNOSIS — D509 Iron deficiency anemia, unspecified: Secondary | ICD-10-CM | POA: Diagnosis not present

## 2021-04-12 DIAGNOSIS — I700 Atherosclerosis of aorta: Secondary | ICD-10-CM | POA: Diagnosis not present

## 2021-04-12 DIAGNOSIS — E7849 Other hyperlipidemia: Secondary | ICD-10-CM | POA: Diagnosis not present

## 2021-04-12 DIAGNOSIS — I10 Essential (primary) hypertension: Secondary | ICD-10-CM | POA: Diagnosis not present

## 2021-04-12 DIAGNOSIS — F17210 Nicotine dependence, cigarettes, uncomplicated: Secondary | ICD-10-CM | POA: Diagnosis not present

## 2021-04-12 DIAGNOSIS — M353 Polymyalgia rheumatica: Secondary | ICD-10-CM | POA: Diagnosis not present

## 2021-04-12 DIAGNOSIS — F331 Major depressive disorder, recurrent, moderate: Secondary | ICD-10-CM | POA: Diagnosis not present

## 2021-04-12 DIAGNOSIS — D509 Iron deficiency anemia, unspecified: Secondary | ICD-10-CM | POA: Diagnosis not present

## 2021-04-12 DIAGNOSIS — J439 Emphysema, unspecified: Secondary | ICD-10-CM | POA: Diagnosis not present

## 2021-04-25 DIAGNOSIS — N3941 Urge incontinence: Secondary | ICD-10-CM | POA: Diagnosis not present

## 2021-05-01 ENCOUNTER — Encounter (INDEPENDENT_AMBULATORY_CARE_PROVIDER_SITE_OTHER): Payer: Self-pay | Admitting: *Deleted

## 2021-05-01 DIAGNOSIS — R059 Cough, unspecified: Secondary | ICD-10-CM | POA: Diagnosis not present

## 2021-05-01 DIAGNOSIS — R350 Frequency of micturition: Secondary | ICD-10-CM | POA: Diagnosis not present

## 2021-05-01 DIAGNOSIS — Z20828 Contact with and (suspected) exposure to other viral communicable diseases: Secondary | ICD-10-CM | POA: Diagnosis not present

## 2021-05-01 DIAGNOSIS — F17210 Nicotine dependence, cigarettes, uncomplicated: Secondary | ICD-10-CM | POA: Diagnosis not present

## 2021-05-20 DIAGNOSIS — I10 Essential (primary) hypertension: Secondary | ICD-10-CM | POA: Diagnosis not present

## 2021-05-28 ENCOUNTER — Ambulatory Visit (INDEPENDENT_AMBULATORY_CARE_PROVIDER_SITE_OTHER): Payer: Medicare HMO | Admitting: Gastroenterology

## 2021-05-28 ENCOUNTER — Telehealth (INDEPENDENT_AMBULATORY_CARE_PROVIDER_SITE_OTHER): Payer: Self-pay

## 2021-05-28 ENCOUNTER — Other Ambulatory Visit (INDEPENDENT_AMBULATORY_CARE_PROVIDER_SITE_OTHER): Payer: Self-pay

## 2021-05-28 ENCOUNTER — Encounter (INDEPENDENT_AMBULATORY_CARE_PROVIDER_SITE_OTHER): Payer: Self-pay | Admitting: Gastroenterology

## 2021-05-28 ENCOUNTER — Encounter (INDEPENDENT_AMBULATORY_CARE_PROVIDER_SITE_OTHER): Payer: Self-pay

## 2021-05-28 ENCOUNTER — Other Ambulatory Visit: Payer: Self-pay

## 2021-05-28 VITALS — BP 135/77 | HR 62 | Temp 98.7°F | Ht 69.0 in | Wt 217.3 lb

## 2021-05-28 DIAGNOSIS — D509 Iron deficiency anemia, unspecified: Secondary | ICD-10-CM

## 2021-05-28 DIAGNOSIS — R195 Other fecal abnormalities: Secondary | ICD-10-CM

## 2021-05-28 DIAGNOSIS — K921 Melena: Secondary | ICD-10-CM | POA: Diagnosis not present

## 2021-05-28 DIAGNOSIS — H401132 Primary open-angle glaucoma, bilateral, moderate stage: Secondary | ICD-10-CM | POA: Diagnosis not present

## 2021-05-28 DIAGNOSIS — R5383 Other fatigue: Secondary | ICD-10-CM | POA: Insufficient documentation

## 2021-05-28 MED ORDER — PEG 3350-KCL-NA BICARB-NACL 420 G PO SOLR: 4000.0000 mL | ORAL | 0 refills | Status: DC

## 2021-05-28 NOTE — Progress Notes (Signed)
Referring Provider: Richardean Chimera, MD Primary Care Physician:  Richardean Chimera, MD Primary GI Physician: newly established  Chief Complaint  Patient presents with   Blood In Stools    Patient here today due to recent hemo sure coming back positive.He states he has noticed some dark stools. He denies any abdominal pain, nausea and vomiting, constipation nor diarrhea,dizziness. He has some sob,and fatigue with exertion.   HPI:   Jaime Whitaker is a 80 y.o. male with past medical history of high cholesterol, HTN, polymyalgia rheumatica.  Patient presenting today as a new patient referred by Dr. Donzetta Sprung for positive hemosure. Patient accompanied by his wife.  Had positive hemosure test at the beginning of Oct with his PCP. Last hgb June 2022 was 12.6, no other labs available for review at this time. he denies any history of blood in his stools. Patient reports that he has noticed some black stools for the past few weeks however, patient does currently take iron (Niferex 150mg  BID) for hx of iron deficiency anemia, has been on this for the past 5-6 months. He states that stools are darker over the past few weeks than before. He denies hematochezia. He denies abdominal pain. He endorses some dizziness and shortness of breath at times, also endorses some fatigue, states these symptoms have been going on for about one month. He reports approx 12 lbs weight loss since April, however, he was hospitalized for a short time then,  he reports that his appetite is great. Denies constipation or diarrhea. He denies nausea or vomiting. No early satiety. Has a 1-2 BMs daily.   NSAID May occasionally  Social hx: no etoh, no tobacco or illicit drugs Fam hx: no crc or liver disease  Last Colonoscopy: many years ago, no records available currently Last Endoscopy: n/a  Past Medical History:  Diagnosis Date   Hypercholesteremia    Hypertension     History reviewed. No pertinent surgical  history.  Current Outpatient Medications  Medication Sig Dispense Refill   acetaminophen (TYLENOL) 500 MG tablet Take 500 mg by mouth every 6 (six) hours as needed.     cholecalciferol (VITAMIN D) 400 UNITS TABS Take 400 Units by mouth daily.     furosemide (LASIX) 40 MG tablet Take 40 mg by mouth daily.     ibuprofen (ADVIL,MOTRIN) 600 MG tablet Take 1 tablet (600 mg total) by mouth every 6 (six) hours as needed for pain. (Patient taking differently: Take 200 mg by mouth every 6 (six) hours as needed for pain.) 30 tablet 0   iron polysaccharides (NIFEREX) 150 MG capsule Take 150 mg by mouth 2 (two) times daily.     lovastatin (MEVACOR) 20 MG tablet Take 20 mg by mouth at bedtime.     metoprolol (LOPRESSOR) 50 MG tablet Take 50 mg by mouth daily.     Multiple Vitamins-Minerals (CENTRUM MEN PO) Take by mouth daily at 6 (six) AM.     Multiple Vitamins-Minerals (ICAPS AREDS 2 PO) Take by mouth daily at 6 (six) AM. 2 daily.     predniSONE (DELTASONE) 20 MG tablet Take 10 mg by mouth daily with breakfast.     oxyCODONE-acetaminophen (PERCOCET/ROXICET) 5-325 MG per tablet Take 1 tablet by mouth every 4 (four) hours as needed. Pain (Patient not taking: Reported on 05/28/2021)     No current facility-administered medications for this visit.    Allergies as of 05/28/2021   (No Known Allergies)    History reviewed. No pertinent  family history.  Social History   Socioeconomic History   Marital status: Married    Spouse name: Not on file   Number of children: Not on file   Years of education: Not on file   Highest education level: Not on file  Occupational History   Not on file  Tobacco Use   Smoking status: Former    Types: Cigarettes   Smokeless tobacco: Never  Vaping Use   Vaping Use: Never used  Substance and Sexual Activity   Alcohol use: No   Drug use: No   Sexual activity: Not on file  Other Topics Concern   Not on file  Social History Narrative   Not on file   Social  Determinants of Health   Financial Resource Strain: Not on file  Food Insecurity: Not on file  Transportation Needs: Not on file  Physical Activity: Not on file  Stress: Not on file  Social Connections: Not on file   Review of systems General: night sweats, fever, chills, weight loss +fatigue Neck: Negative for lumps, goiter, pain and significant neck swelling Resp: Negative for cough, wheezing, +sob CV: Negative for chest pain, leg swelling, palpitations, orthopnea +dizziness +sob GI: denies hematochezia, nausea, vomiting, diarrhea, constipation, dysphagia, odyonophagia, early satiety or unintentional weight loss. +melena MSK: Negative for joint pain or swelling, back pain, and muscle pain. Derm: Negative for itching or rash Psych: Denies depression, anxiety, memory loss, confusion. No homicidal or suicidal ideation.  Heme: Negative for prolonged bleeding, bruising easily, and swollen nodes. Endocrine: Negative for cold or heat intolerance, polyuria, polydipsia and goiter. Neuro: negative for tremor, gait imbalance, syncope and seizures. The remainder of the review of systems is noncontributory.  Physical Exam: BP 135/77 (BP Location: Right Arm, Patient Position: Sitting, Cuff Size: Large)   Pulse 62   Temp 98.7 F (37.1 C) (Oral)   Ht 5\' 9"  (1.753 m)   Wt 217 lb 4.8 oz (98.6 kg)   BMI 32.09 kg/m  General:   Alert and oriented. No distress noted. Pleasant and cooperative.  Head:  Normocephalic and atraumatic. Eyes:  Conjuctiva clear without scleral icterus. Mouth:  Oral mucosa pink and moist. Good dentition. No lesions. Heart: Normal rate and rhythm, s1 and s2 heart sounds present.  Lungs: Clear lung sounds in all lobes. Respirations equal and unlabored. Abdomen:  +BS, soft, non-tender and non-distended. No rebound or guarding. No HSM or masses noted. Derm: No palmar erythema or jaundice Msk:  Symmetrical without gross deformities. Normal posture. Extremities:  Without  edema. Neurologic:  Alert and  oriented x4 Psych:  Alert and cooperative. Normal mood and affect.  Invalid input(s): 6 MONTHS   ASSESSMENT: Jaime Whitaker is a 80 y.o. male presenting today after positive Hemosure in October with his PCP.   Patient has hx of iron deficiency anemia, is currently on 150mg  BID of PO iron daily. He denies any history of rectal bleeding or ever needed a blood transfusion. He does report some melena for the past few weeks, has been on iron for 5-6 months. He denies any BRBPR. He has had a colonoscopy in the past, but it was many years ago. We will attempt to obtain these records. I do not have any recent labs to review. We will check CBC and Iron studies today as patient c/o fatigue, sob and dizziness x1 month. We will proceed with EGD and colonoscopy for further evaluation of occult blood in stools/anemia as malignancy, polyps, PUD, gastritis, AVMs cannot be ruled out.  PLAN:  CBC, iron studies 2.  Schedule EGD, Colonoscopy 3. Obtain old colonoscopy records from [facility] 4. Continue iron daily  5. Patient to call with worsening fatigue, sob or dizziness 6. Will let me know if he develops rectal bleeding  *Meds needed holding prior to procedure: currently on Eliquis since April or May, no DM medications. Is also on daily PO iron.   Indications, risks and benefits of procedure discussed in detail with patient. All questions were answered. Patient verbalized understanding and is in agreement to proceed with EGD/colonoscopy at this time.    Follow Up: 3 months  Casmere Hollenbeck L. Jeanmarie Hubert, MSN, APRN, AGNP-C Adult-Gerontology Nurse Practitioner [facility]

## 2021-05-28 NOTE — Telephone Encounter (Signed)
Jaime Whitaker, CMA  

## 2021-05-28 NOTE — H&P (View-Only) (Signed)
 Referring Provider: Daniel, Terry G, MD Primary Care Physician:  Daniel, Terry G, MD Primary GI Physician: newly established  Chief Complaint  Patient presents with   Blood In Stools    Patient here today due to recent hemo sure coming back positive.He states he has noticed some dark stools. He denies any abdominal pain, nausea and vomiting, constipation nor diarrhea,dizziness. He has some sob,and fatigue with exertion.   HPI:   Jaime Whitaker is a 79 y.o. male with past medical history of high cholesterol, HTN, polymyalgia rheumatica.  Patient presenting today as a new patient referred by Dr. Terry Daniel for positive hemosure. Patient accompanied by his wife.  Had positive hemosure test at the beginning of Oct with his PCP. Last hgb June 2022 was 12.6, no other labs available for review at this time. he denies any history of blood in his stools. Patient reports that he has noticed some black stools for the past few weeks however, patient does currently take iron (Niferex 150mg BID) for hx of iron deficiency anemia, has been on this for the past 5-6 months. He states that stools are darker over the past few weeks than before. He denies hematochezia. He denies abdominal pain. He endorses some dizziness and shortness of breath at times, also endorses some fatigue, states these symptoms have been going on for about one month. He reports approx 12 lbs weight loss since April, however, he was hospitalized for a short time then,  he reports that his appetite is great. Denies constipation or diarrhea. He denies nausea or vomiting. No early satiety. Has a 1-2 BMs daily.   NSAID use:ibuprofen occasionally  Social hx: no etoh, no tobacco or illicit drugs Fam hx: no crc or liver disease  Last Colonoscopy: many years ago, no records available currently Last Endoscopy: n/a  Past Medical History:  Diagnosis Date   Hypercholesteremia    Hypertension     History reviewed. No pertinent surgical  history.  Current Outpatient Medications  Medication Sig Dispense Refill   acetaminophen (TYLENOL) 500 MG tablet Take 500 mg by mouth every 6 (six) hours as needed.     cholecalciferol (VITAMIN D) 400 UNITS TABS Take 400 Units by mouth daily.     furosemide (LASIX) 40 MG tablet Take 40 mg by mouth daily.     ibuprofen (ADVIL,MOTRIN) 600 MG tablet Take 1 tablet (600 mg total) by mouth every 6 (six) hours as needed for pain. (Patient taking differently: Take 200 mg by mouth every 6 (six) hours as needed for pain.) 30 tablet 0   iron polysaccharides (NIFEREX) 150 MG capsule Take 150 mg by mouth 2 (two) times daily.     lovastatin (MEVACOR) 20 MG tablet Take 20 mg by mouth at bedtime.     metoprolol (LOPRESSOR) 50 MG tablet Take 50 mg by mouth daily.     Multiple Vitamins-Minerals (CENTRUM MEN PO) Take by mouth daily at 6 (six) AM.     Multiple Vitamins-Minerals (ICAPS AREDS 2 PO) Take by mouth daily at 6 (six) AM. 2 daily.     predniSONE (DELTASONE) 20 MG tablet Take 10 mg by mouth daily with breakfast.     oxyCODONE-acetaminophen (PERCOCET/ROXICET) 5-325 MG per tablet Take 1 tablet by mouth every 4 (four) hours as needed. Pain (Patient not taking: Reported on 05/28/2021)     No current facility-administered medications for this visit.    Allergies as of 05/28/2021   (No Known Allergies)    History reviewed. No pertinent   family history.  Social History   Socioeconomic History   Marital status: Married    Spouse name: Not on file   Number of children: Not on file   Years of education: Not on file   Highest education level: Not on file  Occupational History   Not on file  Tobacco Use   Smoking status: Former    Types: Cigarettes   Smokeless tobacco: Never  Vaping Use   Vaping Use: Never used  Substance and Sexual Activity   Alcohol use: No   Drug use: No   Sexual activity: Not on file  Other Topics Concern   Not on file  Social History Narrative   Not on file   Social  Determinants of Health   Financial Resource Strain: Not on file  Food Insecurity: Not on file  Transportation Needs: Not on file  Physical Activity: Not on file  Stress: Not on file  Social Connections: Not on file   Review of systems General: night sweats, fever, chills, weight loss +fatigue Neck: Negative for lumps, goiter, pain and significant neck swelling Resp: Negative for cough, wheezing, +sob CV: Negative for chest pain, leg swelling, palpitations, orthopnea +dizziness +sob GI: denies hematochezia, nausea, vomiting, diarrhea, constipation, dysphagia, odyonophagia, early satiety or unintentional weight loss. +melena MSK: Negative for joint pain or swelling, back pain, and muscle pain. Derm: Negative for itching or rash Psych: Denies depression, anxiety, memory loss, confusion. No homicidal or suicidal ideation.  Heme: Negative for prolonged bleeding, bruising easily, and swollen nodes. Endocrine: Negative for cold or heat intolerance, polyuria, polydipsia and goiter. Neuro: negative for tremor, gait imbalance, syncope and seizures. The remainder of the review of systems is noncontributory.  Physical Exam: BP 135/77 (BP Location: Right Arm, Patient Position: Sitting, Cuff Size: Large)   Pulse 62   Temp 98.7 F (37.1 C) (Oral)   Ht 5' 9" (1.753 m)   Wt 217 lb 4.8 oz (98.6 kg)   BMI 32.09 kg/m  General:   Alert and oriented. No distress noted. Pleasant and cooperative.  Head:  Normocephalic and atraumatic. Eyes:  Conjuctiva clear without scleral icterus. Mouth:  Oral mucosa pink and moist. Good dentition. No lesions. Heart: Normal rate and rhythm, s1 and s2 heart sounds present.  Lungs: Clear lung sounds in all lobes. Respirations equal and unlabored. Abdomen:  +BS, soft, non-tender and non-distended. No rebound or guarding. No HSM or masses noted. Derm: No palmar erythema or jaundice Msk:  Symmetrical without gross deformities. Normal posture. Extremities:  Without  edema. Neurologic:  Alert and  oriented x4 Psych:  Alert and cooperative. Normal mood and affect.  Invalid input(s): 6 MONTHS   ASSESSMENT: Jaime Whitaker is a 79 y.o. male presenting today after positive Hemosure in October with his PCP.   Patient has hx of iron deficiency anemia, is currently on 150mg BID of PO iron daily. He denies any history of rectal bleeding or ever needed a blood transfusion. He does report some melena for the past few weeks, has been on iron for 5-6 months. He denies any BRBPR. He has had a colonoscopy in the past, but it was many years ago. We will attempt to obtain these records. I do not have any recent labs to review. We will check CBC and Iron studies today as patient c/o fatigue, sob and dizziness x1 month. We will proceed with EGD and colonoscopy for further evaluation of occult blood in stools/anemia as malignancy, polyps, PUD, gastritis, AVMs cannot be ruled out.      PLAN:  CBC, iron studies 2.  Schedule EGD, Colonoscopy 3. Obtain old colonoscopy records from [facility] 4. Continue iron daily  5. Patient to call with worsening fatigue, sob or dizziness 6. Will let me know if he develops rectal bleeding  *Meds needed holding prior to procedure: currently on Eliquis since April or May, no DM medications. Is also on daily PO iron.   Indications, risks and benefits of procedure discussed in detail with patient. All questions were answered. Patient verbalized understanding and is in agreement to proceed with EGD/colonoscopy at this time.    Follow Up: 3 months  Eh Sauseda L. Jeanmarie Hubert, MSN, APRN, AGNP-C Adult-Gerontology Nurse Practitioner [facility]

## 2021-05-28 NOTE — Patient Instructions (Signed)
We will check your iron levels and hemoglobin today to evaluate for worsening anemia/blood loss. If you develop worsening shortness of breath, dizziness or fatigue or bright red blood when having a bowel movement, please let me know.  We will schedule you for colonoscopy and EGD for further evaluation of your positive stool test as we need to investigate why you are having blood in your stools.   Follow up 3 months

## 2021-05-29 ENCOUNTER — Encounter (INDEPENDENT_AMBULATORY_CARE_PROVIDER_SITE_OTHER): Payer: Self-pay

## 2021-05-29 LAB — IRON,TIBC AND FERRITIN PANEL
%SAT: 12 % (calc) — ABNORMAL LOW (ref 20–48)
Ferritin: 23 ng/mL — ABNORMAL LOW (ref 24–380)
Iron: 39 ug/dL — ABNORMAL LOW (ref 50–180)
TIBC: 329 mcg/dL (calc) (ref 250–425)

## 2021-05-29 LAB — CBC
HCT: 40 % (ref 38.5–50.0)
Hemoglobin: 12.9 g/dL — ABNORMAL LOW (ref 13.2–17.1)
MCH: 28.7 pg (ref 27.0–33.0)
MCHC: 32.3 g/dL (ref 32.0–36.0)
MCV: 89.1 fL (ref 80.0–100.0)
MPV: 10.4 fL (ref 7.5–12.5)
Platelets: 262 10*3/uL (ref 140–400)
RBC: 4.49 10*6/uL (ref 4.20–5.80)
RDW: 17.3 % — ABNORMAL HIGH (ref 11.0–15.0)
WBC: 7.9 10*3/uL (ref 3.8–10.8)

## 2021-06-03 ENCOUNTER — Other Ambulatory Visit (INDEPENDENT_AMBULATORY_CARE_PROVIDER_SITE_OTHER): Payer: Self-pay | Admitting: Gastroenterology

## 2021-06-03 MED ORDER — FERROUS SULFATE 325 (65 FE) MG PO TBEC: 325.0000 mg | DELAYED_RELEASE_TABLET | Freq: Two times a day (BID) | ORAL | 2 refills | Status: AC

## 2021-06-04 NOTE — Patient Instructions (Signed)
Jaime Whitaker  06/04/2021     @PREFPERIOPPHARMACY @   Your procedure is scheduled on  06/11/2021.   Report to 06/13/2021 at  0830 A.M.   Call this number if you have problems the morning of surgery:  [phone]   Remember:  Follow the diet and prep instructions given to you by the office.    Take these medicines the morning of surgery with A SIP OF WATER                   metoprolol, prednisone.     Do not wear jewelry, make-up or nail polish.  Do not wear lotions, powders, or perfumes, or deodorant.  Do not shave 48 hours prior to surgery.  Men may shave face and neck.  Do not bring valuables to the hospital.  [facility] is not responsible for any belongings or valuables.  Contacts, dentures or bridgework may not be worn into surgery.  Leave your suitcase in the car.  After surgery it may be brought to your room.  For patients admitted to the hospital, discharge time will be determined by your treatment team.  Patients discharged the day of surgery will not be allowed to drive home and must have someone with them for 24 hours.    Special instructions:   DO NOT smoke tobacco or vape for 24 hours before your procedure.  Please read over the following fact sheets that you were given. Anesthesia Post-op Instructions and Care and Recovery After Surgery      Upper Endoscopy, Adult, Care After This sheet gives you information about how to care for yourself after your procedure. Your health care provider may also give you more specific instructions. If you have problems or questions, contact your health care provider. What can I expect after the procedure? After the procedure, it is common to have: A sore throat. Mild stomach pain or discomfort. Bloating. Nausea. Follow these instructions at home:  Follow instructions from your health care provider about what to eat or drink after your procedure. Return to your normal activities as told by your health  care provider. Ask your health care provider what activities are safe for you. Take over-the-counter and prescription medicines only as told by your health care provider. If you were given a sedative during the procedure, it can affect you for several hours. Do not drive or operate machinery until your health care provider says that it is safe. Keep all follow-up visits as told by your health care provider. This is important. Contact a health care provider if you have: A sore throat that lasts longer than one day. Trouble swallowing. Get help right away if: You vomit blood or your vomit looks like coffee grounds. You have: A fever. Bloody, black, or tarry stools. A severe sore throat or you cannot swallow. Difficulty breathing. Severe pain in your chest or abdomen. Summary After the procedure, it is common to have a sore throat, mild stomach discomfort, bloating, and nausea. If you were given a sedative during the procedure, it can affect you for several hours. Do not drive or operate machinery until your health care provider says that it is safe. Follow instructions from your health care provider about what to eat or drink after your procedure. Return to your normal activities as told by your health care provider. This information is not intended to replace advice given to you by your health care provider. Make sure you  discuss any questions you have with your health care provider. Document Revised: 05/20/2019 Document Reviewed: 12/14/2017 Elsevier Patient Education  2022 [employer] Colonoscopy, Adult, Care After This sheet gives you information about how to care for yourself after your procedure. Your health care provider may also give you more specific instructions. If you have problems or questions, contact your health care provider. What can I expect after the procedure? After the procedure, it is common to have: A small amount of blood in your stool for 24 hours after the  procedure. Some gas. Mild cramping or bloating of your abdomen. Follow these instructions at home: Eating and drinking  Drink enough fluid to keep your urine pale yellow. Follow instructions from your health care provider about eating or drinking restrictions. Resume your normal diet as instructed by your health care provider. Avoid heavy or fried foods that are hard to digest. Activity Rest as told by your health care provider. Avoid sitting for a long time without moving. Get up to take short walks every 1-2 hours. This is important to improve blood flow and breathing. Ask for help if you feel weak or unsteady. Return to your normal activities as told by your health care provider. Ask your health care provider what activities are safe for you. Managing cramping and bloating  Try walking around when you have cramps or feel bloated. Apply heat to your abdomen as told by your health care provider. Use the heat source that your health care provider recommends, such as a moist heat pack or a heating pad. Place a towel between your skin and the heat source. Leave the heat on for 20-30 minutes. Remove the heat if your skin turns bright red. This is especially important if you are unable to feel pain, heat, or cold. You may have a greater risk of getting burned. General instructions If you were given a sedative during the procedure, it can affect you for several hours. Do not drive or operate machinery until your health care provider says that it is safe. For the first 24 hours after the procedure: Do not sign important documents. Do not drink alcohol. Do your regular daily activities at a slower pace than normal. Eat soft foods that are easy to digest. Take over-the-counter and prescription medicines only as told by your health care provider. Keep all follow-up visits as told by your health care provider. This is important. Contact a health care provider if: You have blood in your stool 2-3  days after the procedure. Get help right away if you have: More than a small spotting of blood in your stool. Large blood clots in your stool. Swelling of your abdomen. Nausea or vomiting. A fever. Increasing pain in your abdomen that is not relieved with medicine. Summary After the procedure, it is common to have a small amount of blood in your stool. You may also have mild cramping and bloating of your abdomen. If you were given a sedative during the procedure, it can affect you for several hours. Do not drive or operate machinery until your health care provider says that it is safe. Get help right away if you have a lot of blood in your stool, nausea or vomiting, a fever, or increased pain in your abdomen. This information is not intended to replace advice given to you by your health care provider. Make sure you discuss any questions you have with your health care provider. Document Revised: 05/20/2019 Document Reviewed: 02/07/2019 Elsevier Patient Education  2022 Elsevier  [place] After This sheet gives you information about how to care for yourself after your procedure. Your health care provider may also give you more specific instructions. If you have problems or questions, contact your health care provider. What can I expect after the procedure? After the procedure, it is common to have: Tiredness. Forgetfulness about what happened after the procedure. Impaired judgment for important decisions. Nausea or vomiting. Some difficulty with balance. Follow these instructions at home: For the time period you were told by your health care provider:   Rest as needed. Do not participate in activities where you could fall or become injured. Do not drive or use machinery. Do not drink alcohol. Do not take sleeping pills or medicines that cause drowsiness. Do not make important decisions or sign legal documents. Do not take care of children on your own. Eating  and drinking Follow the diet that is recommended by your health care provider. Drink enough fluid to keep your urine pale yellow. If you vomit: Drink water, juice, or soup when you can drink without vomiting. Make sure you have little or no nausea before eating solid foods. General instructions Have a responsible adult stay with you for the time you are told. It is important to have someone help care for you until you are awake and alert. Take over-the-counter and prescription medicines only as told by your health care provider. If you have sleep apnea, surgery and certain medicines can increase your risk for breathing problems. Follow instructions from your health care provider about wearing your sleep device: Anytime you are sleeping, including during daytime naps. While taking prescription pain medicines, sleeping medicines, or medicines that make you drowsy. Avoid smoking. Keep all follow-up visits as told by your health care provider. This is important. Contact a health care provider if: You keep feeling nauseous or you keep vomiting. You feel light-headed. You are still sleepy or having trouble with balance after 24 hours. You develop a rash. You have a fever. You have redness or swelling around the IV site. Get help right away if: You have trouble breathing. You have new-onset confusion at home. Summary For several hours after your procedure, you may feel tired. You may also be forgetful and have poor judgment. Have a responsible adult stay with you for the time you are told. It is important to have someone help care for you until you are awake and alert. Rest as told. Do not drive or operate machinery. Do not drink alcohol or take sleeping pills. Get help right away if you have trouble breathing, or if you suddenly become confused. This information is not intended to replace advice given to you by your health care provider. Make sure you discuss any questions you have with your  health care provider. Document Revised: 03/29/2020 Document Reviewed: 06/16/2019 Elsevier Patient Education  2022 [employer].

## 2021-06-06 ENCOUNTER — Encounter (HOSPITAL_COMMUNITY)
Admission: RE | Admit: 2021-06-06 | Discharge: 2021-06-06 | Disposition: A | Discharge status: Home / Self Care | Payer: Medicare HMO | Source: Ambulatory Visit | Attending: Gastroenterology | Admitting: Gastroenterology

## 2021-06-06 ENCOUNTER — Encounter (HOSPITAL_COMMUNITY): Payer: Self-pay

## 2021-06-06 DIAGNOSIS — Z79899 Other long term (current) drug therapy: Secondary | ICD-10-CM

## 2021-06-06 DIAGNOSIS — Z01818 Encounter for other preprocedural examination: Secondary | ICD-10-CM | POA: Diagnosis not present

## 2021-06-06 DIAGNOSIS — D509 Iron deficiency anemia, unspecified: Secondary | ICD-10-CM

## 2021-06-06 DIAGNOSIS — R195 Other fecal abnormalities: Secondary | ICD-10-CM

## 2021-06-06 LAB — BASIC METABOLIC PANEL
Anion gap: 9 (ref 5–15)
BUN: 19 mg/dL (ref 8–23)
CO2: 29 mmol/L (ref 22–32)
Calcium: 9.3 mg/dL (ref 8.9–10.3)
Chloride: 101 mmol/L (ref 98–111)
Creatinine, Ser: 1.16 mg/dL (ref 0.61–1.24)
GFR, Estimated: >60 mL/min (ref >60)
Glucose, Bld: 114 mg/dL — ABNORMAL HIGH (ref 70–99)
Potassium: 4.2 mmol/L (ref 3.5–5.1)
Sodium: 139 mmol/L (ref 135–145)

## 2021-06-07 ENCOUNTER — Encounter (INDEPENDENT_AMBULATORY_CARE_PROVIDER_SITE_OTHER): Payer: Self-pay

## 2021-06-11 ENCOUNTER — Encounter (HOSPITAL_COMMUNITY)
Admission: RE | Disposition: A | Discharge status: Home / Self Care | Payer: Self-pay | Source: Home / Self Care | Attending: Gastroenterology

## 2021-06-11 ENCOUNTER — Other Ambulatory Visit: Payer: Self-pay

## 2021-06-11 ENCOUNTER — Ambulatory Visit (HOSPITAL_COMMUNITY)
Admission: RE | Admit: 2021-06-11 | Discharge: 2021-06-11 | Disposition: A | Discharge status: Home / Self Care | Payer: Medicare HMO | Attending: Gastroenterology | Admitting: Gastroenterology

## 2021-06-11 ENCOUNTER — Ambulatory Visit (HOSPITAL_COMMUNITY): Payer: Medicare HMO | Admitting: Anesthesiology

## 2021-06-11 DIAGNOSIS — J449 Chronic obstructive pulmonary disease, unspecified: Secondary | ICD-10-CM | POA: Diagnosis not present

## 2021-06-11 DIAGNOSIS — I10 Essential (primary) hypertension: Secondary | ICD-10-CM | POA: Insufficient documentation

## 2021-06-11 DIAGNOSIS — Z87891 Personal history of nicotine dependence: Secondary | ICD-10-CM | POA: Diagnosis not present

## 2021-06-11 DIAGNOSIS — R195 Other fecal abnormalities: Secondary | ICD-10-CM | POA: Diagnosis not present

## 2021-06-11 DIAGNOSIS — K31A19 Gastric intestinal metaplasia without dysplasia, unspecified site: Secondary | ICD-10-CM | POA: Diagnosis not present

## 2021-06-11 DIAGNOSIS — M353 Polymyalgia rheumatica: Secondary | ICD-10-CM | POA: Diagnosis not present

## 2021-06-11 DIAGNOSIS — K5730 Diverticulosis of large intestine without perforation or abscess without bleeding: Secondary | ICD-10-CM | POA: Insufficient documentation

## 2021-06-11 DIAGNOSIS — E7800 Pure hypercholesterolemia, unspecified: Secondary | ICD-10-CM | POA: Insufficient documentation

## 2021-06-11 DIAGNOSIS — B9681 Helicobacter pylori [H. pylori] as the cause of diseases classified elsewhere: Secondary | ICD-10-CM | POA: Insufficient documentation

## 2021-06-11 DIAGNOSIS — K2960 Other gastritis without bleeding: Secondary | ICD-10-CM | POA: Diagnosis not present

## 2021-06-11 DIAGNOSIS — K2289 Other specified disease of esophagus: Secondary | ICD-10-CM

## 2021-06-11 DIAGNOSIS — K3189 Other diseases of stomach and duodenum: Secondary | ICD-10-CM

## 2021-06-11 DIAGNOSIS — D509 Iron deficiency anemia, unspecified: Secondary | ICD-10-CM | POA: Diagnosis not present

## 2021-06-11 HISTORY — PX: ESOPHAGOGASTRODUODENOSCOPY (EGD) WITH PROPOFOL: SHX5813

## 2021-06-11 HISTORY — PX: BIOPSY: SHX5522

## 2021-06-11 HISTORY — PX: COLONOSCOPY WITH PROPOFOL: SHX5780

## 2021-06-11 SURGERY — COLONOSCOPY WITH PROPOFOL
Anesthesia: General

## 2021-06-11 MED ORDER — LIDOCAINE HCL (CARDIAC) PF 100 MG/5ML IV SOSY
PREFILLED_SYRINGE | INTRAVENOUS | Status: DC | PRN
  Administered 2021-06-11: 50 mg via INTRAVENOUS

## 2021-06-11 MED ORDER — PHENYLEPHRINE 40 MCG/ML (10ML) SYRINGE FOR IV PUSH (FOR BLOOD PRESSURE SUPPORT)
PREFILLED_SYRINGE | INTRAVENOUS | Status: DC | PRN
  Administered 2021-06-11 (×3): 80 ug via INTRAVENOUS

## 2021-06-11 MED ORDER — PHENYLEPHRINE 40 MCG/ML (10ML) SYRINGE FOR IV PUSH (FOR BLOOD PRESSURE SUPPORT)
PREFILLED_SYRINGE | INTRAVENOUS | Status: AC
  Filled 2021-06-11: qty 10

## 2021-06-11 MED ORDER — PROPOFOL 10 MG/ML IV BOLUS
INTRAVENOUS | Status: DC | PRN
  Administered 2021-06-11: 100 mg via INTRAVENOUS

## 2021-06-11 MED ORDER — PROPOFOL 500 MG/50ML IV EMUL
INTRAVENOUS | Status: DC | PRN
  Administered 2021-06-11: 100 ug/kg/min via INTRAVENOUS

## 2021-06-11 MED ORDER — EPHEDRINE 5 MG/ML INJ
INTRAVENOUS | Status: AC
  Filled 2021-06-11: qty 5

## 2021-06-11 MED ORDER — LACTATED RINGERS IV SOLN: INTRAVENOUS | Status: DC | PRN

## 2021-06-11 MED ORDER — EPHEDRINE SULFATE-NACL 50-0.9 MG/10ML-% IV SOSY
PREFILLED_SYRINGE | INTRAVENOUS | Status: DC | PRN
  Administered 2021-06-11 (×2): 5 mg via INTRAVENOUS

## 2021-06-11 NOTE — Op Note (Addendum)
[facility] Patient Name: Jaime Whitaker Procedure Date: 06/11/2021 10:27 AM MRN: [id number] Date of Birth: October 03, 1940 Attending MD: Katrinka Blazing ,  CSN: [id number] Age: 80 Admit Type: Outpatient Procedure:                Colonoscopy Indications:              Iron deficiency anemia Providers:                Katrinka Blazing, Buel Ream. Thomasena Edis RN, RN, Edythe Clarity, Technician Referring MD:              Medicines:                Monitored Anesthesia Care Complications:            No immediate complications. Estimated Blood Loss:     Estimated blood loss: none. Procedure:                Pre-Anesthesia Assessment:                           - Prior to the procedure, a History and Physical                            was performed, and patient medications, allergies                            and sensitivities were reviewed. The patient's                            tolerance of previous anesthesia was reviewed.                           - The risks and benefits of the procedure and the                            sedation options and risks were discussed with the                            patient. All questions were answered and informed                            consent was obtained.                           - ASA Grade Assessment: III - A patient with severe                            systemic disease.                           After obtaining informed consent, the colonoscope                            was passed under direct vision. Throughout the  procedure, the patient's blood pressure, pulse, and                            oxygen saturations were monitored continuously. The                            [id number]-[id number] scope was introduced through                            the anus and advanced to the the cecum, identified                            by appendiceal orifice and ileocecal valve. The                             colonoscopy was performed without difficulty. The                            patient tolerated the procedure well. The quality                            of the bowel preparation was adequate after                            thorough irrigation. Scope In: 10:34:28 AM Scope Out: 11:04:03 AM Scope Withdrawal Time: 0 hours 19 minutes 42 seconds  Total Procedure Duration: 0 hours 29 minutes 35 seconds  Findings:      The perianal and digital rectal examinations were normal.      Multiple small and large-mouthed diverticula were found in the entire       colon. There was presence of focal congestion close to one of the       diverticula.      The retroflexed view of the distal rectum and anal verge was normal and       showed no anal or rectal abnormalities. Impression:               - Diverticulosis in the entire examined colon.                           - The distal rectum and anal verge are normal on                            retroflexion view.                           - No specimens collected. Moderate Sedation:      Per Anesthesia Care Recommendation:           - Discharge patient to home (ambulatory).                           - Resume previous diet.                           - Repeat colonoscopy is not recommended due to  current age (20 years or older) for screening                            purposes.                           - Schedule capsule endoscopy Procedure Code(s):        --- Professional ---                           [phone], Colonoscopy, flexible; diagnostic, including                            collection of specimen(s) by brushing or washing,                            when performed (separate procedure) Diagnosis Code(s):        --- Professional ---                           D50.9, Iron deficiency anemia, unspecified                           K57.30, Diverticulosis of large intestine without                            perforation or  abscess without bleeding CPT copyright 2019 American Medical Association. All rights reserved. The codes documented in this report are preliminary and upon coder review may  be revised to meet current compliance requirements. Maylon Peppers, MD Maylon Peppers,  06/11/2021 11:14:15 AM This report has been signed electronically. Number of Addenda: 0

## 2021-06-11 NOTE — Anesthesia Postprocedure Evaluation (Signed)
Anesthesia Post Note  Patient: Jaime Whitaker  Procedure(s) Performed: COLONOSCOPY WITH PROPOFOL ESOPHAGOGASTRODUODENOSCOPY (EGD) WITH PROPOFOL BIOPSY  Patient location during evaluation: Phase II Anesthesia Type: General Level of consciousness: awake Pain management: pain level controlled Vital Signs Assessment: post-procedure vital signs reviewed and stable Respiratory status: spontaneous breathing and respiratory function stable Cardiovascular status: blood pressure returned to baseline and stable Postop Assessment: no headache and no apparent nausea or vomiting Anesthetic complications: no Comments: Late entry   No notable events documented.   Last Vitals:  Vitals:   06/11/21 0849 06/11/21 1110  BP: 128/76 110/62  Pulse: (!) 56 63  Resp: 16 18  Temp: 36.6 C 36.5 C  SpO2: 99% 99%    Last Pain:  Vitals:   06/11/21 1110  TempSrc: Oral  PainSc: 0-No pain                 Windell Norfolk

## 2021-06-11 NOTE — Discharge Instructions (Addendum)
You are being discharged to home.  Resume your previous diet.  We are waiting for your pathology results.  Restart Apixaban today Your physician has indicated that a repeat colonoscopy is not recommended due to your current age (36 years or older) for screening purposes.  Schedule capsule endoscopy

## 2021-06-11 NOTE — Anesthesia Preprocedure Evaluation (Signed)
Anesthesia Evaluation  Patient identified by MRN, date of birth, ID band Patient awake    Reviewed: Allergy & Precautions, H&P , NPO status , Patient's Chart, lab work & pertinent test results, reviewed documented beta blocker date and time   Airway Mallampati: II  TM Distance: >3 FB Neck ROM: full    Dental no notable dental hx.    Pulmonary sleep apnea , COPD, former smoker,    Pulmonary exam normal breath sounds clear to auscultation       Cardiovascular Exercise Tolerance: Good hypertension, negative cardio ROS   Rhythm:regular Rate:Normal     Neuro/Psych PSYCHIATRIC DISORDERS Depression negative neurological ROS     GI/Hepatic negative GI ROS, Neg liver ROS,   Endo/Other  negative endocrine ROS  Renal/GU negative Renal ROS  negative genitourinary   Musculoskeletal   Abdominal   Peds  Hematology  (+) Blood dyscrasia, anemia ,   Anesthesia Other Findings   Reproductive/Obstetrics negative OB ROS                             Anesthesia Physical Anesthesia Plan  ASA: 3  Anesthesia Plan: General   Post-op Pain Management:    Induction:   PONV Risk Score and Plan: Propofol infusion  Airway Management Planned:   Additional Equipment:   Intra-op Plan:   Post-operative Plan:   Informed Consent: I have reviewed the patients History and Physical, chart, labs and discussed the procedure including the risks, benefits and alternatives for the proposed anesthesia with the patient or authorized representative who has indicated his/her understanding and acceptance.     Dental Advisory Given  Plan Discussed with: CRNA  Anesthesia Plan Comments:         Anesthesia Quick Evaluation

## 2021-06-11 NOTE — Anesthesia Procedure Notes (Signed)
Date/Time: 06/11/2021 10:15 AM Performed by: Julian Reil, CRNA Pre-anesthesia Checklist: Patient identified, Emergency Drugs available, Suction available and Patient being monitored Patient Re-evaluated:Patient Re-evaluated prior to induction Oxygen Delivery Method: Nasal cannula Induction Type: IV induction Placement Confirmation: positive ETCO2

## 2021-06-11 NOTE — Transfer of Care (Signed)
Immediate Anesthesia Transfer of Care Note  Patient: Jaime Whitaker  Procedure(s) Performed: COLONOSCOPY WITH PROPOFOL ESOPHAGOGASTRODUODENOSCOPY (EGD) WITH PROPOFOL BIOPSY  Patient Location: Short Stay  Anesthesia Type:General  Level of Consciousness: awake and oriented  Airway & Oxygen Therapy: Patient Spontanous Breathing  Post-op Assessment: Report given to RN and Post -op Vital signs reviewed and stable  Post vital signs: Reviewed and stable  Last Vitals:  Vitals Value Taken Time  BP    Temp    Pulse    Resp    SpO2      Last Pain:  Vitals:   06/11/21 1008  TempSrc:   PainSc: 0-No pain      Patients Stated Pain Goal: 5 (06/11/21 0849)  Complications: No notable events documented.

## 2021-06-11 NOTE — Op Note (Signed)
[facility] Patient Name: Jaime Whitaker Procedure Date: 06/11/2021 10:09 AM MRN: [id number] Date of Birth: 1940-09-13 Attending MD: Katrinka Blazing ,  CSN: [id number] Age: 80 Admit Type: Outpatient Procedure:                Upper GI endoscopy Indications:              Iron deficiency anemia Providers:                Katrinka Blazing, Buel Ream. Thomasena Edis RN, RN, Edythe Clarity, Technician Referring MD:              Medicines:                Monitored Anesthesia Care Complications:            No immediate complications. Estimated Blood Loss:     Estimated blood loss: none. Procedure:                Pre-Anesthesia Assessment:                           - Prior to the procedure, a History and Physical                            was performed, and patient medications, allergies                            and sensitivities were reviewed. The patient's                            tolerance of previous anesthesia was reviewed.                           - The risks and benefits of the procedure and the                            sedation options and risks were discussed with the                            patient. All questions were answered and informed                            consent was obtained.                           - ASA Grade Assessment: III - A patient with severe                            systemic disease.                           After obtaining informed consent, the endoscope was                            passed under direct vision. Throughout the  procedure, the patient's blood pressure, pulse, and                            oxygen saturations were monitored continuously. The                            [id number]) scope was introduced through the                            mouth, and advanced to the second part of duodenum.                            The upper GI endoscopy was accomplished without                             difficulty. The patient tolerated the procedure                            well. Scope In: 10:14:00 AM Scope Out: 10:27:18 AM Total Procedure Duration: 0 hours 13 minutes 18 seconds  Findings:      The examined esophagus was normal.      The Z-line was irregular and was found 40 cm from the incisors.      Localized nodular mucosa was found in the gastric antrum. Resrt of the       stomach looked normal, although there was scant congestion in teh       gastric body, Biopsies were taken with a cold forceps for Helicobacter       pylori testing.      The examined duodenum was normal. Biopsies were taken with a cold       forceps for histology. Impression:               - Normal esophagus.                           - Z-line irregular, 40 cm from the incisors.                           - Nodular mucosa in the gastric antrum. Biopsied.                           - Normal examined duodenum. Biopsied. Moderate Sedation:      Per Anesthesia Care Recommendation:           - Discharge patient to home (ambulatory).                           - Resume previous diet.                           - Await pathology results.                           - Continue oral iron daily. Procedure Code(s):        --- Professional ---  [id number], Esophagogastroduodenoscopy, flexible,                            transoral; with biopsy, single or multiple Diagnosis Code(s):        --- Professional ---                           K22.8, Other specified diseases of esophagus                           K31.89, Other diseases of stomach and duodenum                           D50.9, Iron deficiency anemia, unspecified CPT copyright 2019 American Medical Association. All rights reserved. The codes documented in this report are preliminary and upon coder review may  be revised to meet current compliance requirements. Maylon Peppers, MD Maylon Peppers,  06/11/2021 11:17:23 AM This report  has been signed electronically. Number of Addenda: 0

## 2021-06-11 NOTE — Interval H&P Note (Signed)
History and Physical Interval Note:  06/11/2021 9:03 AM Jaime Whitaker is a 80 y.o. male with past medical history of high cholesterol, HTN, polymyalgia rheumatica, coming for evaluation of iron deficiency anemia and positive FOBT.  BP 128/76   Pulse (!) 56   Temp 97.9 F (36.6 C) (Oral)   Resp 16   SpO2 99%  GENERAL: The patient is AO x3, in no acute distress. HEENT: Head is normocephalic and atraumatic. EOMI are intact. Mouth is well hydrated and without lesions. NECK: Supple. No masses LUNGS: Clear to auscultation. No presence of rhonchi/wheezing/rales. Adequate chest expansion HEART: RRR, normal s1 and s2. ABDOMEN: Soft, nontender, no guarding, no peritoneal signs, and nondistended. BS +. No masses. EXTREMITIES: Without any cyanosis, clubbing, rash, lesions or edema. NEUROLOGIC: AOx3, no focal motor deficit. SKIN: no jaundice, no rashes  Jaime Whitaker  has presented today for surgery, with the diagnosis of Positive hemocult Iron deficiency anemia.  The various methods of treatment have been discussed with the patient and family. After consideration of risks, benefits and other options for treatment, the patient has consented to  Procedure(s) with comments: COLONOSCOPY WITH PROPOFOL (N/A) - 10:10 ESOPHAGOGASTRODUODENOSCOPY (EGD) WITH PROPOFOL (N/A) as a surgical intervention.  The patient's history has been reviewed, patient examined, no change in status, stable for surgery.  I have reviewed the patient's chart and labs.  Questions were answered to the patient's satisfaction.     Katrinka Blazing Mayorga

## 2021-06-13 ENCOUNTER — Telehealth (INDEPENDENT_AMBULATORY_CARE_PROVIDER_SITE_OTHER): Payer: Self-pay | Admitting: Gastroenterology

## 2021-06-13 DIAGNOSIS — B351 Tinea unguium: Secondary | ICD-10-CM | POA: Diagnosis not present

## 2021-06-13 DIAGNOSIS — M79676 Pain in unspecified toe(s): Secondary | ICD-10-CM | POA: Diagnosis not present

## 2021-06-13 LAB — SURGICAL PATHOLOGY

## 2021-06-13 NOTE — Telephone Encounter (Signed)
Patient's daughter returned your call.  

## 2021-06-14 ENCOUNTER — Other Ambulatory Visit (INDEPENDENT_AMBULATORY_CARE_PROVIDER_SITE_OTHER): Payer: Self-pay | Admitting: Gastroenterology

## 2021-06-14 DIAGNOSIS — K2970 Gastritis, unspecified, without bleeding: Secondary | ICD-10-CM

## 2021-06-14 DIAGNOSIS — B9681 Helicobacter pylori [H. pylori] as the cause of diseases classified elsewhere: Secondary | ICD-10-CM

## 2021-06-14 MED ORDER — OMEPRAZOLE 40 MG PO CPDR: 40.0000 mg | DELAYED_RELEASE_CAPSULE | Freq: Two times a day (BID) | ORAL | 0 refills | Status: AC

## 2021-06-14 MED ORDER — PYLERA 140-125-125 MG PO CAPS: 3.0000 | ORAL_CAPSULE | Freq: Three times a day (TID) | ORAL | 0 refills | Status: DC

## 2021-06-17 ENCOUNTER — Other Ambulatory Visit (INDEPENDENT_AMBULATORY_CARE_PROVIDER_SITE_OTHER): Payer: Self-pay | Admitting: Gastroenterology

## 2021-06-17 ENCOUNTER — Encounter (INDEPENDENT_AMBULATORY_CARE_PROVIDER_SITE_OTHER): Payer: Self-pay

## 2021-06-17 ENCOUNTER — Encounter (HOSPITAL_COMMUNITY): Payer: Self-pay | Admitting: Gastroenterology

## 2021-06-17 DIAGNOSIS — K2970 Gastritis, unspecified, without bleeding: Secondary | ICD-10-CM | POA: Insufficient documentation

## 2021-06-17 DIAGNOSIS — A048 Other specified bacterial intestinal infections: Secondary | ICD-10-CM | POA: Insufficient documentation

## 2021-06-17 DIAGNOSIS — B9681 Helicobacter pylori [H. pylori] as the cause of diseases classified elsewhere: Secondary | ICD-10-CM | POA: Insufficient documentation

## 2021-06-17 MED ORDER — METRONIDAZOLE 500 MG PO TABS
500.0000 mg | ORAL_TABLET | Freq: Three times a day (TID) | ORAL | 0 refills | Status: AC
Start: 2021-06-17 — End: 2021-07-01

## 2021-06-17 MED ORDER — TETRACYCLINE HCL 500 MG PO CAPS: 500.0000 mg | ORAL_CAPSULE | Freq: Four times a day (QID) | ORAL | 0 refills | Status: AC

## 2021-06-17 MED ORDER — BISMUTH 262 MG PO CHEW: 2.0000 | CHEWABLE_TABLET | Freq: Four times a day (QID) | ORAL | 0 refills | Status: AC

## 2021-06-19 ENCOUNTER — Encounter (INDEPENDENT_AMBULATORY_CARE_PROVIDER_SITE_OTHER): Payer: Self-pay

## 2021-06-19 ENCOUNTER — Telehealth (INDEPENDENT_AMBULATORY_CARE_PROVIDER_SITE_OTHER): Payer: Self-pay | Admitting: *Deleted

## 2021-06-19 NOTE — Telephone Encounter (Signed)
Wife states meds are $95 and would like alternative. Pylera was not covered and med was changed to flagyl, metronidazole and bismoth which pt states is $95. Advised pt's wife that is the only other option for treatment. She states she will pick up med when its ready on Friday.

## 2021-06-19 NOTE — Telephone Encounter (Signed)
Please call daughter Anson [place] [phone] or wife Darel Hong [phone] - they have question about medicine for [name] 10-23-2040

## 2021-06-26 ENCOUNTER — Telehealth (INDEPENDENT_AMBULATORY_CARE_PROVIDER_SITE_OTHER): Payer: Self-pay

## 2021-06-26 DIAGNOSIS — H409 Unspecified glaucoma: Secondary | ICD-10-CM | POA: Diagnosis not present

## 2021-06-26 DIAGNOSIS — R9431 Abnormal electrocardiogram [ECG] [EKG]: Secondary | ICD-10-CM | POA: Diagnosis not present

## 2021-06-26 DIAGNOSIS — R0602 Shortness of breath: Secondary | ICD-10-CM | POA: Diagnosis not present

## 2021-06-26 DIAGNOSIS — I10 Essential (primary) hypertension: Secondary | ICD-10-CM | POA: Diagnosis not present

## 2021-06-26 DIAGNOSIS — R42 Dizziness and giddiness: Secondary | ICD-10-CM | POA: Diagnosis not present

## 2021-06-26 DIAGNOSIS — E7849 Other hyperlipidemia: Secondary | ICD-10-CM | POA: Diagnosis not present

## 2021-06-26 DIAGNOSIS — R791 Abnormal coagulation profile: Secondary | ICD-10-CM | POA: Diagnosis not present

## 2021-06-26 DIAGNOSIS — K219 Gastro-esophageal reflux disease without esophagitis: Secondary | ICD-10-CM | POA: Diagnosis not present

## 2021-06-26 DIAGNOSIS — R001 Bradycardia, unspecified: Secondary | ICD-10-CM | POA: Diagnosis not present

## 2021-06-26 DIAGNOSIS — G319 Degenerative disease of nervous system, unspecified: Secondary | ICD-10-CM | POA: Diagnosis not present

## 2021-06-26 DIAGNOSIS — K625 Hemorrhage of anus and rectum: Secondary | ICD-10-CM | POA: Diagnosis not present

## 2021-06-26 DIAGNOSIS — R109 Unspecified abdominal pain: Secondary | ICD-10-CM | POA: Diagnosis not present

## 2021-06-26 DIAGNOSIS — R079 Chest pain, unspecified: Secondary | ICD-10-CM | POA: Diagnosis not present

## 2021-06-26 NOTE — Telephone Encounter (Addendum)
I spoke with the patient daughter and the patient wife. Patient was given Promethazine and Meclizine and famotidine last night at the ed at [facility]. They wanted to make sure these medications were not going to interfere with the current treatment of the H Pylori. Please advise.  Patient daughter called back today states she would like to discuss her fathers visit to the ed yesterday. She left her mothers # [phone] and her # of [phone]. I called the daughters # and the vm not set up and the mothers # no answer.  Patient daughter called and wanted to know why we sent the patient to the Ed .I advised that with the patient symptoms we felt that he needed further evaluation. She states understanding.

## 2021-06-26 NOTE — Telephone Encounter (Signed)
That's a good call, meds can cause nausea and black stools but not the other symptoms

## 2021-06-26 NOTE — Telephone Encounter (Signed)
Patient wife called today stating patient recently started on Metronidazole, bismuth and tetracycline for H pylori. He is having vomiting black stools,dizziness, shortness of breath and fevers. I advised the wife to take to the Ed for evaluation. She stats understanding.

## 2021-06-27 NOTE — Telephone Encounter (Signed)
Spoke with the wife about patient's symptoms.  Unfortunately, Jaime Whitaker has not shared the medical records yet through epic and I am not able to see what evaluation was performed yesterday.  He is feeling better after starting meclizine and famotidine.  These medications do not interact with his current H. pylori regimen.  He should finish his whole H. pylori regimen.  Based on the wife's report, he had an episode of vertigo that has improved with medication.  I encouraged her to finish the whole medication course.  She understood and agreed.

## 2021-07-03 ENCOUNTER — Encounter (HOSPITAL_COMMUNITY)
Admission: RE | Disposition: A | Discharge status: Home / Self Care | Payer: Self-pay | Source: Home / Self Care | Attending: Gastroenterology

## 2021-07-03 ENCOUNTER — Ambulatory Visit (HOSPITAL_COMMUNITY)
Admission: RE | Admit: 2021-07-03 | Discharge: 2021-07-03 | Disposition: A | Discharge status: Home / Self Care | Payer: Medicare HMO | Attending: Gastroenterology | Admitting: Gastroenterology

## 2021-07-03 DIAGNOSIS — D500 Iron deficiency anemia secondary to blood loss (chronic): Secondary | ICD-10-CM | POA: Insufficient documentation

## 2021-07-03 DIAGNOSIS — Q2733 Arteriovenous malformation of digestive system vessel: Secondary | ICD-10-CM | POA: Insufficient documentation

## 2021-07-03 DIAGNOSIS — K5520 Angiodysplasia of colon without hemorrhage: Secondary | ICD-10-CM | POA: Diagnosis not present

## 2021-07-03 DIAGNOSIS — D509 Iron deficiency anemia, unspecified: Secondary | ICD-10-CM | POA: Diagnosis present

## 2021-07-03 HISTORY — PX: GIVENS CAPSULE STUDY: SHX5432

## 2021-07-03 SURGERY — IMAGING PROCEDURE, GI TRACT, INTRALUMINAL, VIA CAPSULE

## 2021-07-05 ENCOUNTER — Encounter (HOSPITAL_COMMUNITY): Payer: Self-pay | Admitting: Gastroenterology

## 2021-07-05 NOTE — Procedures (Signed)
Small Bowel Givens Capsule Study Procedure date: 07/03/2021   PCP:  Dr. Richardean Chimera, MD  Indication for procedure:  iron deficiency anemia  Findings: Adequate study as there was presence of adequate prep and the endoscopic capsule reached the cecum.  There was presence of 1 isolated nonbleeding small AVM in the proximal small bowel (located at 00:47:17).  No hematin was seen in the GI lumen or any other lesions.  First Gastric image:  00:01:22 First Duodenal image: 00:42:57 First Cecal image: 02:40:06 Gastric Passage time: 0h 61m Small Bowel Passage time:  1h 21m     Summary & Recommendations: Presence of one isolated nonbleeding AVM in the proximal small bowel, no other lesions were observed in the GI tract.  I personally communicated these recommendations with the patient's daughter, the patient was not available to discuss this further.  Will recommend continuing his oral iron intake.  I discussed the possibility of performing a push enteroscopy for ablation with APC but the daughter would like to discuss this further with her father before scheduling the procedure.  Katrinka Blazing, MD Gastroenterology and Hepatology [facility]

## 2021-07-11 ENCOUNTER — Other Ambulatory Visit (INDEPENDENT_AMBULATORY_CARE_PROVIDER_SITE_OTHER): Payer: Self-pay

## 2021-07-11 DIAGNOSIS — E7849 Other hyperlipidemia: Secondary | ICD-10-CM | POA: Diagnosis not present

## 2021-07-11 DIAGNOSIS — K5520 Angiodysplasia of colon without hemorrhage: Secondary | ICD-10-CM

## 2021-07-11 DIAGNOSIS — I10 Essential (primary) hypertension: Secondary | ICD-10-CM | POA: Diagnosis not present

## 2021-07-11 DIAGNOSIS — R7301 Impaired fasting glucose: Secondary | ICD-10-CM | POA: Diagnosis not present

## 2021-07-11 DIAGNOSIS — J440 Chronic obstructive pulmonary disease with acute lower respiratory infection: Secondary | ICD-10-CM | POA: Diagnosis not present

## 2021-07-11 DIAGNOSIS — E782 Mixed hyperlipidemia: Secondary | ICD-10-CM | POA: Diagnosis not present

## 2021-07-11 DIAGNOSIS — E041 Nontoxic single thyroid nodule: Secondary | ICD-10-CM | POA: Diagnosis not present

## 2021-07-11 DIAGNOSIS — E1165 Type 2 diabetes mellitus with hyperglycemia: Secondary | ICD-10-CM | POA: Diagnosis not present

## 2021-07-11 DIAGNOSIS — K219 Gastro-esophageal reflux disease without esophagitis: Secondary | ICD-10-CM | POA: Diagnosis not present

## 2021-07-12 ENCOUNTER — Telehealth (INDEPENDENT_AMBULATORY_CARE_PROVIDER_SITE_OTHER): Payer: Self-pay

## 2021-07-12 ENCOUNTER — Encounter (INDEPENDENT_AMBULATORY_CARE_PROVIDER_SITE_OTHER): Payer: Self-pay

## 2021-07-12 NOTE — Telephone Encounter (Signed)
Dr Donzetta Sprung faxed in a okay for Jaime Whitaker to hold his Eliquis 2 days prior to procedure on Jul 20, 2021

## 2021-07-12 NOTE — Telephone Encounter (Signed)
Thanks

## 2021-07-15 ENCOUNTER — Telehealth (INDEPENDENT_AMBULATORY_CARE_PROVIDER_SITE_OTHER): Payer: Self-pay

## 2021-07-15 DIAGNOSIS — E7849 Other hyperlipidemia: Secondary | ICD-10-CM | POA: Diagnosis not present

## 2021-07-15 DIAGNOSIS — F331 Major depressive disorder, recurrent, moderate: Secondary | ICD-10-CM | POA: Diagnosis not present

## 2021-07-15 DIAGNOSIS — I700 Atherosclerosis of aorta: Secondary | ICD-10-CM | POA: Diagnosis not present

## 2021-07-15 DIAGNOSIS — E1165 Type 2 diabetes mellitus with hyperglycemia: Secondary | ICD-10-CM | POA: Diagnosis not present

## 2021-07-15 DIAGNOSIS — I10 Essential (primary) hypertension: Secondary | ICD-10-CM | POA: Diagnosis not present

## 2021-07-15 DIAGNOSIS — Z0001 Encounter for general adult medical examination with abnormal findings: Secondary | ICD-10-CM | POA: Diagnosis not present

## 2021-07-15 DIAGNOSIS — M353 Polymyalgia rheumatica: Secondary | ICD-10-CM | POA: Diagnosis not present

## 2021-07-15 DIAGNOSIS — J439 Emphysema, unspecified: Secondary | ICD-10-CM | POA: Diagnosis not present

## 2021-07-15 NOTE — Telephone Encounter (Signed)
Yes, he needs to proceed with push enteroscopy as scheduled, plan is to "burn" one of the "little vessels" that caused the anemia.  If she wants , I can send some medicine for nausea

## 2021-07-15 NOTE — Patient Instructions (Signed)
Jaime Whitaker  07/15/2021     @PREFPERIOPPHARMACY @   Your procedure is scheduled on  Aug 06, 2021.   Report to [facility] at  1230  P.M.   Call this number if you have problems the morning of surgery:  [phone]   Remember:  Follow the diet and prep instructions given to you by the office.    If your are still taking eliquis, your last dose should be taken on 07/14/2021.    Take these medicines the morning of surgery with A SIP OF WATER                               metoprolol, phenergan (if needed).     Do not wear jewelry, make-up or nail polish.  Do not wear lotions, powders, or perfumes, or deodorant.  Do not shave 48 hours prior to surgery.  Men may shave face and neck.  Do not bring valuables to the hospital.  [facility] is not responsible for any belongings or valuables.  Contacts, dentures or bridgework may not be worn into surgery.  Leave your suitcase in the car.  After surgery it may be brought to your room.  For patients admitted to the hospital, discharge time will be determined by your treatment team.  Patients discharged the day of surgery will not be allowed to drive home and must have someone with them for 24 hours.    Special instructions:   DO NOT smoke tobacco or vape for 24 hours before your procedure.  Please read over the following fact sheets that you were given. Anesthesia Post-op Instructions and Care and Recovery After Surgery      Upper Endoscopy, Adult, Care After This sheet gives you information about how to care for yourself after your procedure. Your health care provider may also give you more specific instructions. If you have problems or questions, contact your health care provider. What can I expect after the procedure? After the procedure, it is common to have: A sore throat. Mild stomach pain or discomfort. Bloating. Nausea. Follow these instructions at home:  Follow instructions from your health care provider  about what to eat or drink after your procedure. Return to your normal activities as told by your health care provider. Ask your health care provider what activities are safe for you. Take over-the-counter and prescription medicines only as told by your health care provider. If you were given a sedative during the procedure, it can affect you for several hours. Do not drive or operate machinery until your health care provider says that it is safe. Keep all follow-up visits as told by your health care provider. This is important. Contact a health care provider if you have: A sore throat that lasts longer than one day. Trouble swallowing. Get help right away if: You vomit blood or your vomit looks like coffee grounds. You have: A fever. Bloody, black, or tarry stools. A severe sore throat or you cannot swallow. Difficulty breathing. Severe pain in your chest or abdomen. Summary After the procedure, it is common to have a sore throat, mild stomach discomfort, bloating, and nausea. If you were given a sedative during the procedure, it can affect you for several hours. Do not drive or operate machinery until your health care provider says that it is safe. Follow instructions from your health care provider about what to eat or drink after your procedure. Return  to your normal activities as told by your health care provider. This information is not intended to replace advice given to you by your health care provider. Make sure you discuss any questions you have with your health care provider. Document Revised: 05/20/2019 Document Reviewed: 12/14/2017 Elsevier Patient Education  2022 [employer]. Monitored Anesthesia Care, Care After This sheet gives you information about how to care for yourself after your procedure. Your health care provider may also give you more specific instructions. If you have problems or questions, contact your health care provider. What can I expect after the  procedure? After the procedure, it is common to have: Tiredness. Forgetfulness about what happened after the procedure. Impaired judgment for important decisions. Nausea or vomiting. Some difficulty with balance. Follow these instructions at home: For the time period you were told by your health care provider:   Rest as needed. Do not participate in activities where you could fall or become injured. Do not drive or use machinery. Do not drink alcohol. Do not take sleeping pills or medicines that cause drowsiness. Do not make important decisions or sign legal documents. Do not take care of children on your own. Eating and drinking Follow the diet that is recommended by your health care provider. Drink enough fluid to keep your urine pale yellow. If you vomit: Drink water, juice, or soup when you can drink without vomiting. Make sure you have little or no nausea before eating solid foods. General instructions Have a responsible adult stay with you for the time you are told. It is important to have someone help care for you until you are awake and alert. Take over-the-counter and prescription medicines only as told by your health care provider. If you have sleep apnea, surgery and certain medicines can increase your risk for breathing problems. Follow instructions from your health care provider about wearing your sleep device: Anytime you are sleeping, including during daytime naps. While taking prescription pain medicines, sleeping medicines, or medicines that make you drowsy. Avoid smoking. Keep all follow-up visits as told by your health care provider. This is important. Contact a health care provider if: You keep feeling nauseous or you keep vomiting. You feel light-headed. You are still sleepy or having trouble with balance after 24 hours. You develop a rash. You have a fever. You have redness or swelling around the IV site. Get help right away if: You have trouble  breathing. You have new-onset confusion at home. Summary For several hours after your procedure, you may feel tired. You may also be forgetful and have poor judgment. Have a responsible adult stay with you for the time you are told. It is important to have someone help care for you until you are awake and alert. Rest as told. Do not drive or operate machinery. Do not drink alcohol or take sleeping pills. Get help right away if you have trouble breathing, or if you suddenly become confused. This information is not intended to replace advice given to you by your health care provider. Make sure you discuss any questions you have with your health care provider. Document Revised: 03/29/2020 Document Reviewed: 06/16/2019 Elsevier Patient Education  2022 [employer].

## 2021-07-15 NOTE — Telephone Encounter (Signed)
Patient daughter [name] aware of all. She states the patient has something at home already for the nausea. I advised if he did not have any left he can call us back and Dr. Levon Hedger would call something in for him. She states understanding.

## 2021-07-15 NOTE — Telephone Encounter (Signed)
Patient daughter Jaime Whitaker called today patient  feeling nauseous and dizzy, lower back pain and staggering around. Patient saw pcp this am and stated all was good.Does he need to keep pre op 07/16/2021 and Egd on 06/28/2021. Patient daughter also wanted know what the plan was while doing the EGD she says you mentioned burning something while you were doing the EGD. Please advise.

## 2021-07-16 ENCOUNTER — Other Ambulatory Visit (INDEPENDENT_AMBULATORY_CARE_PROVIDER_SITE_OTHER): Payer: Self-pay

## 2021-07-16 ENCOUNTER — Encounter (HOSPITAL_COMMUNITY)
Admission: RE | Admit: 2021-07-16 | Discharge: 2021-07-16 | Disposition: A | Discharge status: Home / Self Care | Payer: Medicare HMO | Source: Ambulatory Visit | Attending: Gastroenterology | Admitting: Gastroenterology

## 2021-07-16 ENCOUNTER — Encounter (HOSPITAL_COMMUNITY): Payer: Self-pay

## 2021-07-16 VITALS — BP 137/92 | HR 97 | Temp 98.5°F | Resp 18 | Ht 69.0 in | Wt 230.0 lb

## 2021-07-16 DIAGNOSIS — G931 Anoxic brain damage, not elsewhere classified: Secondary | ICD-10-CM | POA: Diagnosis not present

## 2021-07-16 DIAGNOSIS — K5520 Angiodysplasia of colon without hemorrhage: Secondary | ICD-10-CM

## 2021-07-16 DIAGNOSIS — Z20822 Contact with and (suspected) exposure to covid-19: Secondary | ICD-10-CM | POA: Diagnosis present

## 2021-07-16 DIAGNOSIS — I712 Thoracic aortic aneurysm, without rupture, unspecified: Secondary | ICD-10-CM | POA: Diagnosis present

## 2021-07-16 DIAGNOSIS — Z711 Person with feared health complaint in whom no diagnosis is made: Secondary | ICD-10-CM | POA: Diagnosis not present

## 2021-07-16 DIAGNOSIS — G936 Cerebral edema: Secondary | ICD-10-CM | POA: Diagnosis not present

## 2021-07-16 DIAGNOSIS — R160 Hepatomegaly, not elsewhere classified: Secondary | ICD-10-CM | POA: Diagnosis not present

## 2021-07-16 DIAGNOSIS — Z01818 Encounter for other preprocedural examination: Secondary | ICD-10-CM | POA: Diagnosis not present

## 2021-07-16 DIAGNOSIS — I2694 Multiple subsegmental pulmonary emboli without acute cor pulmonale: Secondary | ICD-10-CM | POA: Diagnosis not present

## 2021-07-16 DIAGNOSIS — R61 Generalized hyperhidrosis: Secondary | ICD-10-CM | POA: Diagnosis not present

## 2021-07-16 DIAGNOSIS — E118 Type 2 diabetes mellitus with unspecified complications: Secondary | ICD-10-CM | POA: Diagnosis present

## 2021-07-16 DIAGNOSIS — J9602 Acute respiratory failure with hypercapnia: Secondary | ICD-10-CM | POA: Diagnosis not present

## 2021-07-16 DIAGNOSIS — G9340 Encephalopathy, unspecified: Secondary | ICD-10-CM | POA: Diagnosis not present

## 2021-07-16 DIAGNOSIS — J9811 Atelectasis: Secondary | ICD-10-CM | POA: Diagnosis not present

## 2021-07-16 DIAGNOSIS — R578 Other shock: Secondary | ICD-10-CM | POA: Diagnosis not present

## 2021-07-16 DIAGNOSIS — I6612 Occlusion and stenosis of left anterior cerebral artery: Secondary | ICD-10-CM | POA: Diagnosis not present

## 2021-07-16 DIAGNOSIS — R570 Cardiogenic shock: Secondary | ICD-10-CM | POA: Diagnosis not present

## 2021-07-16 DIAGNOSIS — I2510 Atherosclerotic heart disease of native coronary artery without angina pectoris: Secondary | ICD-10-CM | POA: Diagnosis not present

## 2021-07-16 DIAGNOSIS — Z66 Do not resuscitate: Secondary | ICD-10-CM | POA: Diagnosis not present

## 2021-07-16 DIAGNOSIS — I700 Atherosclerosis of aorta: Secondary | ICD-10-CM | POA: Diagnosis not present

## 2021-07-16 DIAGNOSIS — D509 Iron deficiency anemia, unspecified: Secondary | ICD-10-CM

## 2021-07-16 DIAGNOSIS — E7800 Pure hypercholesterolemia, unspecified: Secondary | ICD-10-CM | POA: Diagnosis not present

## 2021-07-16 DIAGNOSIS — D62 Acute posthemorrhagic anemia: Secondary | ICD-10-CM | POA: Diagnosis present

## 2021-07-16 DIAGNOSIS — I63312 Cerebral infarction due to thrombosis of left middle cerebral artery: Secondary | ICD-10-CM | POA: Diagnosis not present

## 2021-07-16 DIAGNOSIS — G9389 Other specified disorders of brain: Secondary | ICD-10-CM | POA: Diagnosis not present

## 2021-07-16 DIAGNOSIS — I469 Cardiac arrest, cause unspecified: Secondary | ICD-10-CM | POA: Diagnosis not present

## 2021-07-16 DIAGNOSIS — I468 Cardiac arrest due to other underlying condition: Secondary | ICD-10-CM | POA: Diagnosis not present

## 2021-07-16 DIAGNOSIS — I10 Essential (primary) hypertension: Secondary | ICD-10-CM | POA: Diagnosis present

## 2021-07-16 DIAGNOSIS — Z515 Encounter for palliative care: Secondary | ICD-10-CM | POA: Diagnosis not present

## 2021-07-16 DIAGNOSIS — K5730 Diverticulosis of large intestine without perforation or abscess without bleeding: Secondary | ICD-10-CM | POA: Diagnosis not present

## 2021-07-16 DIAGNOSIS — I2699 Other pulmonary embolism without acute cor pulmonale: Secondary | ICD-10-CM | POA: Diagnosis not present

## 2021-07-16 DIAGNOSIS — N200 Calculus of kidney: Secondary | ICD-10-CM | POA: Diagnosis not present

## 2021-07-16 DIAGNOSIS — R0689 Other abnormalities of breathing: Secondary | ICD-10-CM | POA: Diagnosis not present

## 2021-07-16 DIAGNOSIS — Y838 Other surgical procedures as the cause of abnormal reaction of the patient, or of later complication, without mention of misadventure at the time of the procedure: Secondary | ICD-10-CM | POA: Diagnosis not present

## 2021-07-16 DIAGNOSIS — I6602 Occlusion and stenosis of left middle cerebral artery: Secondary | ICD-10-CM | POA: Diagnosis not present

## 2021-07-16 DIAGNOSIS — Z01812 Encounter for preprocedural laboratory examination: Secondary | ICD-10-CM | POA: Insufficient documentation

## 2021-07-16 DIAGNOSIS — I6300 Cerebral infarction due to thrombosis of unspecified precerebral artery: Secondary | ICD-10-CM | POA: Diagnosis not present

## 2021-07-16 DIAGNOSIS — R000 Tachycardia, unspecified: Secondary | ICD-10-CM | POA: Diagnosis not present

## 2021-07-16 DIAGNOSIS — E669 Obesity, unspecified: Secondary | ICD-10-CM | POA: Diagnosis present

## 2021-07-16 DIAGNOSIS — Z4682 Encounter for fitting and adjustment of non-vascular catheter: Secondary | ICD-10-CM | POA: Diagnosis not present

## 2021-07-16 DIAGNOSIS — R0600 Dyspnea, unspecified: Secondary | ICD-10-CM | POA: Diagnosis not present

## 2021-07-16 DIAGNOSIS — Z9889 Other specified postprocedural states: Secondary | ICD-10-CM | POA: Diagnosis not present

## 2021-07-16 DIAGNOSIS — I6389 Other cerebral infarction: Secondary | ICD-10-CM | POA: Diagnosis not present

## 2021-07-16 DIAGNOSIS — Z789 Other specified health status: Secondary | ICD-10-CM | POA: Diagnosis not present

## 2021-07-16 DIAGNOSIS — R569 Unspecified convulsions: Secondary | ICD-10-CM | POA: Diagnosis not present

## 2021-07-16 DIAGNOSIS — K6389 Other specified diseases of intestine: Secondary | ICD-10-CM | POA: Diagnosis not present

## 2021-07-16 DIAGNOSIS — E042 Nontoxic multinodular goiter: Secondary | ICD-10-CM | POA: Diagnosis not present

## 2021-07-16 DIAGNOSIS — F329 Major depressive disorder, single episode, unspecified: Secondary | ICD-10-CM | POA: Diagnosis present

## 2021-07-16 DIAGNOSIS — I6529 Occlusion and stenosis of unspecified carotid artery: Secondary | ICD-10-CM | POA: Diagnosis not present

## 2021-07-16 DIAGNOSIS — J3489 Other specified disorders of nose and nasal sinuses: Secondary | ICD-10-CM | POA: Diagnosis not present

## 2021-07-16 DIAGNOSIS — I2692 Saddle embolus of pulmonary artery without acute cor pulmonale: Secondary | ICD-10-CM | POA: Diagnosis not present

## 2021-07-16 DIAGNOSIS — R404 Transient alteration of awareness: Secondary | ICD-10-CM | POA: Diagnosis not present

## 2021-07-16 DIAGNOSIS — I639 Cerebral infarction, unspecified: Secondary | ICD-10-CM | POA: Diagnosis not present

## 2021-07-16 DIAGNOSIS — I63512 Cerebral infarction due to unspecified occlusion or stenosis of left middle cerebral artery: Secondary | ICD-10-CM | POA: Diagnosis present

## 2021-07-16 DIAGNOSIS — J9601 Acute respiratory failure with hypoxia: Secondary | ICD-10-CM | POA: Diagnosis not present

## 2021-07-16 DIAGNOSIS — J439 Emphysema, unspecified: Secondary | ICD-10-CM | POA: Diagnosis present

## 2021-07-16 DIAGNOSIS — J90 Pleural effusion, not elsewhere classified: Secondary | ICD-10-CM | POA: Diagnosis not present

## 2021-07-16 DIAGNOSIS — R0902 Hypoxemia: Secondary | ICD-10-CM | POA: Diagnosis not present

## 2021-07-16 DIAGNOSIS — R4020 Unspecified coma: Secondary | ICD-10-CM | POA: Diagnosis not present

## 2021-07-16 DIAGNOSIS — I6523 Occlusion and stenosis of bilateral carotid arteries: Secondary | ICD-10-CM | POA: Diagnosis not present

## 2021-07-16 DIAGNOSIS — I6503 Occlusion and stenosis of bilateral vertebral arteries: Secondary | ICD-10-CM | POA: Diagnosis not present

## 2021-07-16 DIAGNOSIS — M353 Polymyalgia rheumatica: Secondary | ICD-10-CM | POA: Diagnosis present

## 2021-07-16 DIAGNOSIS — N179 Acute kidney failure, unspecified: Secondary | ICD-10-CM | POA: Diagnosis not present

## 2021-07-16 DIAGNOSIS — Q2112 Patent foramen ovale: Secondary | ICD-10-CM | POA: Diagnosis not present

## 2021-07-16 DIAGNOSIS — K5521 Angiodysplasia of colon with hemorrhage: Secondary | ICD-10-CM | POA: Diagnosis not present

## 2021-07-16 DIAGNOSIS — G8101 Flaccid hemiplegia affecting right dominant side: Secondary | ICD-10-CM | POA: Diagnosis present

## 2021-07-16 DIAGNOSIS — Z7189 Other specified counseling: Secondary | ICD-10-CM | POA: Diagnosis not present

## 2021-07-16 DIAGNOSIS — Z86711 Personal history of pulmonary embolism: Secondary | ICD-10-CM | POA: Diagnosis not present

## 2021-07-16 DIAGNOSIS — I63412 Cerebral infarction due to embolism of left middle cerebral artery: Secondary | ICD-10-CM | POA: Diagnosis present

## 2021-07-16 DIAGNOSIS — I517 Cardiomegaly: Secondary | ICD-10-CM | POA: Diagnosis not present

## 2021-07-16 LAB — CBC WITH DIFFERENTIAL/PLATELET
Abs Immature Granulocytes: 0.01 10*3/uL (ref 0.00–0.07)
Basophils Absolute: 0 10*3/uL (ref 0.0–0.1)
Basophils Relative: 1 %
Eosinophils Absolute: 0.1 10*3/uL (ref 0.0–0.5)
Eosinophils Relative: 1 %
HCT: 48.1 % (ref 39.0–52.0)
Hemoglobin: 15.3 g/dL (ref 13.0–17.0)
Immature Granulocytes: 0 %
Lymphocytes Relative: 27 %
Lymphs Abs: 1.8 10*3/uL (ref 0.7–4.0)
MCH: 29.6 pg (ref 26.0–34.0)
MCHC: 31.8 g/dL (ref 30.0–36.0)
MCV: 93 fL (ref 80.0–100.0)
Monocytes Absolute: 0.7 10*3/uL (ref 0.1–1.0)
Monocytes Relative: 10 %
Neutro Abs: 4 10*3/uL (ref 1.7–7.7)
Neutrophils Relative %: 61 %
Platelets: 141 10*3/uL — ABNORMAL LOW (ref 150–400)
RBC: 5.17 MIL/uL (ref 4.22–5.81)
RDW: 18.6 % — ABNORMAL HIGH (ref 11.5–15.5)
WBC: 6.6 10*3/uL (ref 4.0–10.5)
nRBC: 0 % (ref 0.0–0.2)

## 2021-07-16 LAB — BASIC METABOLIC PANEL
Anion gap: 11 (ref 5–15)
BUN: 21 mg/dL (ref 8–23)
CO2: 24 mmol/L (ref 22–32)
Calcium: 9.5 mg/dL (ref 8.9–10.3)
Chloride: 99 mmol/L (ref 98–111)
Creatinine, Ser: 1.46 mg/dL — ABNORMAL HIGH (ref 0.61–1.24)
GFR, Estimated: 49 mL/min — ABNORMAL LOW (ref >60)
Glucose, Bld: 192 mg/dL — ABNORMAL HIGH (ref 70–99)
Potassium: 4.1 mmol/L (ref 3.5–5.1)
Sodium: 134 mmol/L — ABNORMAL LOW (ref 135–145)

## 2021-07-17 ENCOUNTER — Encounter (HOSPITAL_COMMUNITY): Payer: Self-pay | Admitting: Anesthesiology

## 2021-07-17 ENCOUNTER — Inpatient Hospital Stay (HOSPITAL_COMMUNITY): Payer: Medicare HMO | Admitting: Anesthesiology

## 2021-07-17 ENCOUNTER — Ambulatory Visit (HOSPITAL_COMMUNITY): Payer: Medicare HMO

## 2021-07-17 ENCOUNTER — Emergency Department (HOSPITAL_COMMUNITY): Payer: Medicare HMO | Admitting: Certified Registered Nurse Anesthetist

## 2021-07-17 ENCOUNTER — Ambulatory Visit (HOSPITAL_COMMUNITY)
Admission: RE | Admit: 2021-07-17 | Discharge: 2021-07-17 | Disposition: A | Discharge status: Home / Self Care | Payer: Medicare HMO | Source: Ambulatory Visit | Attending: Neurology | Admitting: Neurology

## 2021-07-17 ENCOUNTER — Emergency Department (HOSPITAL_COMMUNITY): Payer: Medicare HMO

## 2021-07-17 ENCOUNTER — Inpatient Hospital Stay (HOSPITAL_COMMUNITY): Payer: Medicare HMO

## 2021-07-17 ENCOUNTER — Encounter (HOSPITAL_COMMUNITY): Admission: EM | Disposition: E | Payer: Self-pay | Source: Home / Self Care | Attending: Neurology

## 2021-07-17 ENCOUNTER — Other Ambulatory Visit (HOSPITAL_COMMUNITY): Payer: Medicare HMO

## 2021-07-17 ENCOUNTER — Ambulatory Visit (HOSPITAL_COMMUNITY): Admission: RE | Admit: 2021-07-17 | Payer: Medicare HMO | Source: Home / Self Care | Admitting: Gastroenterology

## 2021-07-17 ENCOUNTER — Other Ambulatory Visit: Payer: Self-pay

## 2021-07-17 ENCOUNTER — Inpatient Hospital Stay (HOSPITAL_COMMUNITY)
Admission: EM | Admit: 2021-07-17 | Discharge: 2021-08-28 | DRG: 023 | Disposition: E | Payer: Medicare HMO | Attending: Neurology | Admitting: Neurology

## 2021-07-17 ENCOUNTER — Encounter (HOSPITAL_COMMUNITY): Payer: Self-pay | Admitting: Emergency Medicine

## 2021-07-17 DIAGNOSIS — I2510 Atherosclerotic heart disease of native coronary artery without angina pectoris: Secondary | ICD-10-CM | POA: Diagnosis not present

## 2021-07-17 DIAGNOSIS — I63512 Cerebral infarction due to unspecified occlusion or stenosis of left middle cerebral artery: Secondary | ICD-10-CM

## 2021-07-17 DIAGNOSIS — R000 Tachycardia, unspecified: Secondary | ICD-10-CM | POA: Diagnosis not present

## 2021-07-17 DIAGNOSIS — I468 Cardiac arrest due to other underlying condition: Secondary | ICD-10-CM | POA: Diagnosis not present

## 2021-07-17 DIAGNOSIS — I272 Pulmonary hypertension, unspecified: Secondary | ICD-10-CM | POA: Diagnosis not present

## 2021-07-17 DIAGNOSIS — J9601 Acute respiratory failure with hypoxia: Secondary | ICD-10-CM | POA: Diagnosis not present

## 2021-07-17 DIAGNOSIS — I2699 Other pulmonary embolism without acute cor pulmonale: Secondary | ICD-10-CM | POA: Diagnosis not present

## 2021-07-17 DIAGNOSIS — E669 Obesity, unspecified: Secondary | ICD-10-CM | POA: Diagnosis present

## 2021-07-17 DIAGNOSIS — R29810 Facial weakness: Secondary | ICD-10-CM | POA: Diagnosis present

## 2021-07-17 DIAGNOSIS — Z515 Encounter for palliative care: Secondary | ICD-10-CM

## 2021-07-17 DIAGNOSIS — G936 Cerebral edema: Secondary | ICD-10-CM | POA: Diagnosis not present

## 2021-07-17 DIAGNOSIS — J9811 Atelectasis: Secondary | ICD-10-CM | POA: Diagnosis not present

## 2021-07-17 DIAGNOSIS — G9389 Other specified disorders of brain: Secondary | ICD-10-CM | POA: Diagnosis not present

## 2021-07-17 DIAGNOSIS — I10 Essential (primary) hypertension: Secondary | ICD-10-CM | POA: Diagnosis present

## 2021-07-17 DIAGNOSIS — G8191 Hemiplegia, unspecified affecting right dominant side: Secondary | ICD-10-CM | POA: Diagnosis present

## 2021-07-17 DIAGNOSIS — I700 Atherosclerosis of aorta: Secondary | ICD-10-CM | POA: Diagnosis not present

## 2021-07-17 DIAGNOSIS — I2694 Multiple subsegmental pulmonary emboli without acute cor pulmonale: Secondary | ICD-10-CM | POA: Diagnosis not present

## 2021-07-17 DIAGNOSIS — R578 Other shock: Secondary | ICD-10-CM | POA: Diagnosis not present

## 2021-07-17 DIAGNOSIS — Z9911 Dependence on respirator [ventilator] status: Secondary | ICD-10-CM

## 2021-07-17 DIAGNOSIS — K5730 Diverticulosis of large intestine without perforation or abscess without bleeding: Secondary | ICD-10-CM | POA: Diagnosis not present

## 2021-07-17 DIAGNOSIS — T68XXXA Hypothermia, initial encounter: Secondary | ICD-10-CM | POA: Diagnosis present

## 2021-07-17 DIAGNOSIS — Z20822 Contact with and (suspected) exposure to covid-19: Secondary | ICD-10-CM | POA: Diagnosis present

## 2021-07-17 DIAGNOSIS — R569 Unspecified convulsions: Secondary | ICD-10-CM | POA: Diagnosis not present

## 2021-07-17 DIAGNOSIS — I2692 Saddle embolus of pulmonary artery without acute cor pulmonale: Secondary | ICD-10-CM | POA: Diagnosis not present

## 2021-07-17 DIAGNOSIS — J3489 Other specified disorders of nose and nasal sinuses: Secondary | ICD-10-CM | POA: Diagnosis not present

## 2021-07-17 DIAGNOSIS — Z8673 Personal history of transient ischemic attack (TIA), and cerebral infarction without residual deficits: Secondary | ICD-10-CM

## 2021-07-17 DIAGNOSIS — Z66 Do not resuscitate: Secondary | ICD-10-CM | POA: Diagnosis not present

## 2021-07-17 DIAGNOSIS — Z452 Encounter for adjustment and management of vascular access device: Secondary | ICD-10-CM

## 2021-07-17 DIAGNOSIS — E042 Nontoxic multinodular goiter: Secondary | ICD-10-CM | POA: Diagnosis not present

## 2021-07-17 DIAGNOSIS — Z01818 Encounter for other preprocedural examination: Secondary | ICD-10-CM | POA: Diagnosis not present

## 2021-07-17 DIAGNOSIS — I97711 Intraoperative cardiac arrest during other surgery: Secondary | ICD-10-CM | POA: Diagnosis not present

## 2021-07-17 DIAGNOSIS — I6300 Cerebral infarction due to thrombosis of unspecified precerebral artery: Secondary | ICD-10-CM | POA: Diagnosis not present

## 2021-07-17 DIAGNOSIS — M4856XS Collapsed vertebra, not elsewhere classified, lumbar region, sequela of fracture: Secondary | ICD-10-CM | POA: Diagnosis present

## 2021-07-17 DIAGNOSIS — I288 Other diseases of pulmonary vessels: Secondary | ICD-10-CM | POA: Diagnosis not present

## 2021-07-17 DIAGNOSIS — I712 Thoracic aortic aneurysm, without rupture, unspecified: Secondary | ICD-10-CM | POA: Diagnosis present

## 2021-07-17 DIAGNOSIS — N179 Acute kidney failure, unspecified: Secondary | ICD-10-CM | POA: Diagnosis not present

## 2021-07-17 DIAGNOSIS — D62 Acute posthemorrhagic anemia: Secondary | ICD-10-CM | POA: Diagnosis present

## 2021-07-17 DIAGNOSIS — F329 Major depressive disorder, single episode, unspecified: Secondary | ICD-10-CM | POA: Diagnosis present

## 2021-07-17 DIAGNOSIS — M353 Polymyalgia rheumatica: Secondary | ICD-10-CM | POA: Diagnosis present

## 2021-07-17 DIAGNOSIS — K5521 Angiodysplasia of colon with hemorrhage: Secondary | ICD-10-CM | POA: Diagnosis not present

## 2021-07-17 DIAGNOSIS — G931 Anoxic brain damage, not elsewhere classified: Secondary | ICD-10-CM | POA: Diagnosis not present

## 2021-07-17 DIAGNOSIS — I6612 Occlusion and stenosis of left anterior cerebral artery: Secondary | ICD-10-CM | POA: Diagnosis not present

## 2021-07-17 DIAGNOSIS — I63412 Cerebral infarction due to embolism of left middle cerebral artery: Secondary | ICD-10-CM | POA: Diagnosis not present

## 2021-07-17 DIAGNOSIS — I6529 Occlusion and stenosis of unspecified carotid artery: Secondary | ICD-10-CM | POA: Diagnosis not present

## 2021-07-17 DIAGNOSIS — Z538 Procedure and treatment not carried out for other reasons: Secondary | ICD-10-CM | POA: Diagnosis not present

## 2021-07-17 DIAGNOSIS — R570 Cardiogenic shock: Secondary | ICD-10-CM | POA: Diagnosis not present

## 2021-07-17 DIAGNOSIS — G8101 Flaccid hemiplegia affecting right dominant side: Secondary | ICD-10-CM | POA: Diagnosis present

## 2021-07-17 DIAGNOSIS — J9602 Acute respiratory failure with hypercapnia: Secondary | ICD-10-CM | POA: Diagnosis not present

## 2021-07-17 DIAGNOSIS — E1165 Type 2 diabetes mellitus with hyperglycemia: Secondary | ICD-10-CM | POA: Diagnosis present

## 2021-07-17 DIAGNOSIS — K6389 Other specified diseases of intestine: Secondary | ICD-10-CM | POA: Diagnosis not present

## 2021-07-17 DIAGNOSIS — D72829 Elevated white blood cell count, unspecified: Secondary | ICD-10-CM | POA: Diagnosis not present

## 2021-07-17 DIAGNOSIS — R160 Hepatomegaly, not elsewhere classified: Secondary | ICD-10-CM | POA: Diagnosis not present

## 2021-07-17 DIAGNOSIS — E7800 Pure hypercholesterolemia, unspecified: Secondary | ICD-10-CM | POA: Diagnosis not present

## 2021-07-17 DIAGNOSIS — Z86711 Personal history of pulmonary embolism: Secondary | ICD-10-CM | POA: Diagnosis not present

## 2021-07-17 DIAGNOSIS — Z6833 Body mass index (BMI) 33.0-33.9, adult: Secondary | ICD-10-CM

## 2021-07-17 DIAGNOSIS — R0689 Other abnormalities of breathing: Secondary | ICD-10-CM | POA: Diagnosis not present

## 2021-07-17 DIAGNOSIS — Z7952 Long term (current) use of systemic steroids: Secondary | ICD-10-CM

## 2021-07-17 DIAGNOSIS — Y838 Other surgical procedures as the cause of abnormal reaction of the patient, or of later complication, without mention of misadventure at the time of the procedure: Secondary | ICD-10-CM | POA: Diagnosis not present

## 2021-07-17 DIAGNOSIS — J439 Emphysema, unspecified: Secondary | ICD-10-CM | POA: Diagnosis present

## 2021-07-17 DIAGNOSIS — R0600 Dyspnea, unspecified: Secondary | ICD-10-CM | POA: Diagnosis not present

## 2021-07-17 DIAGNOSIS — I639 Cerebral infarction, unspecified: Secondary | ICD-10-CM | POA: Diagnosis not present

## 2021-07-17 DIAGNOSIS — R61 Generalized hyperhidrosis: Secondary | ICD-10-CM | POA: Diagnosis not present

## 2021-07-17 DIAGNOSIS — I6602 Occlusion and stenosis of left middle cerebral artery: Secondary | ICD-10-CM | POA: Diagnosis not present

## 2021-07-17 DIAGNOSIS — R4701 Aphasia: Secondary | ICD-10-CM | POA: Diagnosis present

## 2021-07-17 DIAGNOSIS — R001 Bradycardia, unspecified: Secondary | ICD-10-CM | POA: Diagnosis not present

## 2021-07-17 DIAGNOSIS — Z9289 Personal history of other medical treatment: Secondary | ICD-10-CM

## 2021-07-17 DIAGNOSIS — G43909 Migraine, unspecified, not intractable, without status migrainosus: Secondary | ICD-10-CM | POA: Diagnosis present

## 2021-07-17 DIAGNOSIS — J90 Pleural effusion, not elsewhere classified: Secondary | ICD-10-CM | POA: Diagnosis not present

## 2021-07-17 DIAGNOSIS — Z789 Other specified health status: Secondary | ICD-10-CM | POA: Diagnosis not present

## 2021-07-17 DIAGNOSIS — Z4682 Encounter for fitting and adjustment of non-vascular catheter: Secondary | ICD-10-CM | POA: Diagnosis not present

## 2021-07-17 DIAGNOSIS — I517 Cardiomegaly: Secondary | ICD-10-CM | POA: Diagnosis not present

## 2021-07-17 DIAGNOSIS — R0902 Hypoxemia: Secondary | ICD-10-CM | POA: Diagnosis not present

## 2021-07-17 DIAGNOSIS — T81718A Complication of other artery following a procedure, not elsewhere classified, initial encounter: Secondary | ICD-10-CM | POA: Diagnosis not present

## 2021-07-17 DIAGNOSIS — D509 Iron deficiency anemia, unspecified: Secondary | ICD-10-CM | POA: Diagnosis present

## 2021-07-17 DIAGNOSIS — I6389 Other cerebral infarction: Secondary | ICD-10-CM | POA: Diagnosis not present

## 2021-07-17 DIAGNOSIS — I6503 Occlusion and stenosis of bilateral vertebral arteries: Secondary | ICD-10-CM | POA: Diagnosis not present

## 2021-07-17 DIAGNOSIS — Z79899 Other long term (current) drug therapy: Secondary | ICD-10-CM

## 2021-07-17 DIAGNOSIS — G9340 Encephalopathy, unspecified: Secondary | ICD-10-CM | POA: Diagnosis not present

## 2021-07-17 DIAGNOSIS — E118 Type 2 diabetes mellitus with unspecified complications: Secondary | ICD-10-CM | POA: Diagnosis present

## 2021-07-17 DIAGNOSIS — T8189XA Other complications of procedures, not elsewhere classified, initial encounter: Secondary | ICD-10-CM | POA: Diagnosis present

## 2021-07-17 DIAGNOSIS — Q2112 Patent foramen ovale: Secondary | ICD-10-CM | POA: Diagnosis not present

## 2021-07-17 DIAGNOSIS — I6523 Occlusion and stenosis of bilateral carotid arteries: Secondary | ICD-10-CM | POA: Diagnosis not present

## 2021-07-17 DIAGNOSIS — Z711 Person with feared health complaint in whom no diagnosis is made: Secondary | ICD-10-CM | POA: Diagnosis not present

## 2021-07-17 DIAGNOSIS — I2721 Secondary pulmonary arterial hypertension: Secondary | ICD-10-CM | POA: Diagnosis not present

## 2021-07-17 DIAGNOSIS — Z7189 Other specified counseling: Secondary | ICD-10-CM | POA: Diagnosis not present

## 2021-07-17 DIAGNOSIS — R29725 NIHSS score 25: Secondary | ICD-10-CM | POA: Diagnosis present

## 2021-07-17 DIAGNOSIS — H518 Other specified disorders of binocular movement: Secondary | ICD-10-CM | POA: Diagnosis present

## 2021-07-17 DIAGNOSIS — I63312 Cerebral infarction due to thrombosis of left middle cerebral artery: Secondary | ICD-10-CM | POA: Diagnosis not present

## 2021-07-17 DIAGNOSIS — F17210 Nicotine dependence, cigarettes, uncomplicated: Secondary | ICD-10-CM | POA: Diagnosis present

## 2021-07-17 DIAGNOSIS — Z86718 Personal history of other venous thrombosis and embolism: Secondary | ICD-10-CM

## 2021-07-17 DIAGNOSIS — D6859 Other primary thrombophilia: Secondary | ICD-10-CM | POA: Diagnosis not present

## 2021-07-17 DIAGNOSIS — S301XXA Contusion of abdominal wall, initial encounter: Secondary | ICD-10-CM | POA: Diagnosis not present

## 2021-07-17 DIAGNOSIS — Z4659 Encounter for fitting and adjustment of other gastrointestinal appliance and device: Secondary | ICD-10-CM

## 2021-07-17 DIAGNOSIS — N200 Calculus of kidney: Secondary | ICD-10-CM | POA: Diagnosis not present

## 2021-07-17 DIAGNOSIS — R404 Transient alteration of awareness: Secondary | ICD-10-CM | POA: Diagnosis not present

## 2021-07-17 DIAGNOSIS — G4733 Obstructive sleep apnea (adult) (pediatric): Secondary | ICD-10-CM | POA: Diagnosis present

## 2021-07-17 DIAGNOSIS — I469 Cardiac arrest, cause unspecified: Secondary | ICD-10-CM | POA: Diagnosis not present

## 2021-07-17 DIAGNOSIS — R4020 Unspecified coma: Secondary | ICD-10-CM | POA: Diagnosis not present

## 2021-07-17 HISTORY — PX: IR PERCUTANEOUS ART THROMBECTOMY/INFUSION INTRACRANIAL INC DIAG ANGIO: IMG6087

## 2021-07-17 HISTORY — PX: IR CT HEAD LTD: IMG2386

## 2021-07-17 HISTORY — PX: RADIOLOGY WITH ANESTHESIA: SHX6223

## 2021-07-17 LAB — COMPREHENSIVE METABOLIC PANEL
ALT: 22 U/L (ref 0–44)
AST: 22 U/L (ref 15–41)
Albumin: 3.7 g/dL (ref 3.5–5.0)
Alkaline Phosphatase: 50 U/L (ref 38–126)
Anion gap: 14 (ref 5–15)
BUN: 23 mg/dL (ref 8–23)
CO2: 26 mmol/L (ref 22–32)
Calcium: 9.3 mg/dL (ref 8.9–10.3)
Chloride: 98 mmol/L (ref 98–111)
Creatinine, Ser: 1.65 mg/dL — ABNORMAL HIGH (ref 0.61–1.24)
GFR, Estimated: 42 mL/min — ABNORMAL LOW (ref >60)
Glucose, Bld: 139 mg/dL — ABNORMAL HIGH (ref 70–99)
Potassium: 4.2 mmol/L (ref 3.5–5.1)
Sodium: 138 mmol/L (ref 135–145)
Total Bilirubin: 1.5 mg/dL — ABNORMAL HIGH (ref 0.3–1.2)
Total Protein: 7.4 g/dL (ref 6.5–8.1)

## 2021-07-17 LAB — POCT I-STAT 7, (LYTES, BLD GAS, ICA,H+H)
Acid-Base Excess: 2 mmol/L (ref 0.0–2.0)
Bicarbonate: 26.1 mmol/L (ref 20.0–28.0)
Calcium, Ion: 1.15 mmol/L (ref 1.15–1.40)
HCT: 42 % (ref 39.0–52.0)
Hemoglobin: 14.3 g/dL (ref 13.0–17.0)
O2 Saturation: 88 %
Patient temperature: 98.7
Potassium: 4.3 mmol/L (ref 3.5–5.1)
Sodium: 138 mmol/L (ref 135–145)
TCO2: 27 mmol/L (ref 22–32)
pCO2 arterial: 40.5 mmHg (ref 32.0–48.0)
pH, Arterial: 7.418 (ref 7.350–7.450)
pO2, Arterial: 53 mmHg — ABNORMAL LOW (ref 83.0–108.0)

## 2021-07-17 LAB — DIFFERENTIAL
Abs Immature Granulocytes: 0.02 10*3/uL (ref 0.00–0.07)
Basophils Absolute: 0 10*3/uL (ref 0.0–0.1)
Basophils Relative: 0 %
Eosinophils Absolute: 0.1 10*3/uL (ref 0.0–0.5)
Eosinophils Relative: 1 %
Immature Granulocytes: 0 %
Lymphocytes Relative: 32 %
Lymphs Abs: 3 10*3/uL (ref 0.7–4.0)
Monocytes Absolute: 1 10*3/uL (ref 0.1–1.0)
Monocytes Relative: 10 %
Neutro Abs: 5.4 10*3/uL (ref 1.7–7.7)
Neutrophils Relative %: 57 %

## 2021-07-17 LAB — ETHANOL: Alcohol, Ethyl (B): <10 mg/dL (ref <10)

## 2021-07-17 LAB — RESP PANEL BY RT-PCR (FLU A&B, COVID) ARPGX2
Influenza A by PCR: NEGATIVE
Influenza B by PCR: NEGATIVE
SARS Coronavirus 2 by RT PCR: NEGATIVE

## 2021-07-17 LAB — CBC
HCT: 46.3 % (ref 39.0–52.0)
Hemoglobin: 14.9 g/dL (ref 13.0–17.0)
MCH: 30.3 pg (ref 26.0–34.0)
MCHC: 32.2 g/dL (ref 30.0–36.0)
MCV: 94.3 fL (ref 80.0–100.0)
Platelets: 125 10*3/uL — ABNORMAL LOW (ref 150–400)
RBC: 4.91 MIL/uL (ref 4.22–5.81)
RDW: 18.4 % — ABNORMAL HIGH (ref 11.5–15.5)
WBC: 9.5 10*3/uL (ref 4.0–10.5)
nRBC: 0 % (ref 0.0–0.2)

## 2021-07-17 LAB — CBG MONITORING, ED: Glucose-Capillary: 120 mg/dL — ABNORMAL HIGH (ref 70–99)

## 2021-07-17 LAB — GLUCOSE, CAPILLARY: Glucose-Capillary: 135 mg/dL — ABNORMAL HIGH (ref 70–99)

## 2021-07-17 LAB — APTT: aPTT: 31 seconds (ref 24–36)

## 2021-07-17 LAB — MRSA NEXT GEN BY PCR, NASAL: MRSA by PCR Next Gen: NOT DETECTED

## 2021-07-17 LAB — PROTIME-INR
INR: 1.4 — ABNORMAL HIGH (ref 0.8–1.2)
Prothrombin Time: 16.8 seconds — ABNORMAL HIGH (ref 11.4–15.2)

## 2021-07-17 SURGERY — IR WITH ANESTHESIA
Anesthesia: General

## 2021-07-17 SURGERY — ESOPHAGOGASTRODUODENOSCOPY (EGD) WITH PROPOFOL
Anesthesia: Monitor Anesthesia Care

## 2021-07-17 MED ORDER — SUGAMMADEX SODIUM 200 MG/2ML IV SOLN
INTRAVENOUS | Status: DC | PRN
  Administered 2021-07-17: 200 mg via INTRAVENOUS

## 2021-07-17 MED ORDER — ACETAMINOPHEN 325 MG PO TABS: 650.0000 mg | ORAL_TABLET | ORAL | Status: DC | PRN

## 2021-07-17 MED ORDER — ASPIRIN 325 MG PO TABS
325.0000 mg | ORAL_TABLET | Freq: Every day | ORAL | Status: DC
  Administered 2021-07-17: 19:00:00 325 mg
  Filled 2021-07-17: qty 1

## 2021-07-17 MED ORDER — FENTANYL CITRATE (PF) 100 MCG/2ML IJ SOLN
INTRAMUSCULAR | Status: DC | PRN
  Administered 2021-07-17 (×2): 50 ug via INTRAVENOUS

## 2021-07-17 MED ORDER — ONDANSETRON HCL 4 MG/2ML IJ SOLN
INTRAMUSCULAR | Status: DC | PRN
  Administered 2021-07-17: 4 mg via INTRAVENOUS

## 2021-07-17 MED ORDER — PHENYLEPHRINE HCL-NACL 20-0.9 MG/250ML-% IV SOLN
INTRAVENOUS | Status: DC | PRN
  Administered 2021-07-17: 40 ug/min via INTRAVENOUS

## 2021-07-17 MED ORDER — FENTANYL CITRATE (PF) 250 MCG/5ML IJ SOLN
INTRAMUSCULAR | Status: DC | PRN
  Administered 2021-07-17: 100 ug via INTRAVENOUS

## 2021-07-17 MED ORDER — PREDNISONE 5 MG PO TABS: 5.0000 mg | ORAL_TABLET | Freq: Every day | ORAL | Status: DC

## 2021-07-17 MED ORDER — HEPARIN (PORCINE) 25000 UT/250ML-% IV SOLN
1100.0000 [IU]/h | INTRAVENOUS | Status: DC
  Administered 2021-07-17: 20:00:00 1100 [IU]/h via INTRAVENOUS
  Filled 2021-07-17: qty 250

## 2021-07-17 MED ORDER — VASOPRESSIN 20 UNITS/100 ML INFUSION FOR SHOCK
0.0000 [IU]/min | INTRAVENOUS | Status: DC
  Filled 2021-07-17: qty 100

## 2021-07-17 MED ORDER — FENTANYL CITRATE (PF) 100 MCG/2ML IJ SOLN
INTRAMUSCULAR | Status: AC
  Filled 2021-07-17: qty 2

## 2021-07-17 MED ORDER — DEXAMETHASONE SODIUM PHOSPHATE 10 MG/ML IJ SOLN: INTRAMUSCULAR | Status: DC | PRN

## 2021-07-17 MED ORDER — FENTANYL CITRATE PF 50 MCG/ML IJ SOSY
25.0000 ug | PREFILLED_SYRINGE | INTRAMUSCULAR | Status: DC | PRN
  Administered 2021-07-18: 16:00:00 100 ug via INTRAVENOUS
  Filled 2021-07-17: qty 2
  Filled 2021-07-17: qty 1
  Filled 2021-07-17 (×2): qty 2

## 2021-07-17 MED ORDER — IOHEXOL 300 MG/ML  SOLN: 100.0000 mL | Freq: Once | INTRAMUSCULAR | Status: DC | PRN

## 2021-07-17 MED ORDER — ASPIRIN 300 MG RE SUPP: 300.0000 mg | Freq: Every day | RECTAL | Status: DC

## 2021-07-17 MED ORDER — PHENYLEPHRINE HCL-NACL 20-0.9 MG/250ML-% IV SOLN
25.0000 ug/min | INTRAVENOUS | Status: DC
  Administered 2021-07-17: 18:00:00 80 ug/min via INTRAVENOUS
  Administered 2021-07-17: 17:00:00 100 ug/min via INTRAVENOUS
  Filled 2021-07-17: qty 250

## 2021-07-17 MED ORDER — IOHEXOL 300 MG/ML  SOLN
100.0000 mL | Freq: Once | INTRAMUSCULAR | Status: AC | PRN
  Administered 2021-07-17: 15:00:00 66 mL via INTRA_ARTERIAL

## 2021-07-17 MED ORDER — DOCUSATE SODIUM 50 MG/5ML PO LIQD: 100.0000 mg | Freq: Two times a day (BID) | ORAL | Status: DC

## 2021-07-17 MED ORDER — CLEVIDIPINE BUTYRATE 0.5 MG/ML IV EMUL
INTRAVENOUS | Status: AC
  Filled 2021-07-17: qty 50

## 2021-07-17 MED ORDER — ESMOLOL HCL 100 MG/10ML IV SOLN
INTRAVENOUS | Status: DC | PRN
  Administered 2021-07-17: 20 mg via INTRAVENOUS

## 2021-07-17 MED ORDER — FENTANYL CITRATE PF 50 MCG/ML IJ SOSY: 25.0000 ug | PREFILLED_SYRINGE | INTRAMUSCULAR | Status: DC | PRN

## 2021-07-17 MED ORDER — PHENYLEPHRINE 40 MCG/ML (10ML) SYRINGE FOR IV PUSH (FOR BLOOD PRESSURE SUPPORT)
PREFILLED_SYRINGE | INTRAVENOUS | Status: DC | PRN
  Administered 2021-07-17 (×3): 120 ug via INTRAVENOUS

## 2021-07-17 MED ORDER — STROKE: EARLY STAGES OF RECOVERY BOOK
Freq: Once | Status: DC
  Filled 2021-07-17: qty 1

## 2021-07-17 MED ORDER — ACETAMINOPHEN 160 MG/5ML PO SOLN: 650.0000 mg | ORAL | Status: DC | PRN

## 2021-07-17 MED ORDER — ACETAMINOPHEN 160 MG/5ML PO SOLN
650.0000 mg | ORAL | Status: DC | PRN
  Administered 2021-07-18 – 2021-07-31 (×10): 650 mg
  Filled 2021-07-17 (×9): qty 20.3

## 2021-07-17 MED ORDER — PROPOFOL 1000 MG/100ML IV EMUL
0.0000 ug/kg/min | INTRAVENOUS | Status: DC
  Administered 2021-07-17: 21:00:00 50 ug/kg/min via INTRAVENOUS
  Administered 2021-07-17: 18:00:00 40 ug/kg/min via INTRAVENOUS
  Administered 2021-07-17: 17:00:00 50 ug/kg/min via INTRAVENOUS
  Filled 2021-07-17 (×2): qty 100

## 2021-07-17 MED ORDER — HEPARIN SODIUM (PORCINE) 1000 UNIT/ML IJ SOLN
INTRAMUSCULAR | Status: DC | PRN
  Administered 2021-07-17: 5000 [IU] via INTRAVENOUS

## 2021-07-17 MED ORDER — SODIUM CHLORIDE 0.9 % IV SOLN: 250.0000 mL | INTRAVENOUS | Status: DC

## 2021-07-17 MED ORDER — PREDNISONE 20 MG PO TABS
20.0000 mg | ORAL_TABLET | Freq: Every day | ORAL | Status: DC
Start: 2021-07-17 — End: 2021-07-18
  Administered 2021-07-17: 19:00:00 20 mg
  Filled 2021-07-17: qty 1

## 2021-07-17 MED ORDER — LIDOCAINE 2% (20 MG/ML) 5 ML SYRINGE
INTRAMUSCULAR | Status: DC | PRN
  Administered 2021-07-17: 100 mg via INTRAVENOUS

## 2021-07-17 MED ORDER — BRIMONIDINE TARTRATE 0.2 % OP SOLN
1.0000 [drp] | Freq: Every day | OPHTHALMIC | Status: DC
  Administered 2021-07-18 – 2021-08-01 (×15): 1 [drp] via OPHTHALMIC
  Filled 2021-07-17: qty 5

## 2021-07-17 MED ORDER — PROPOFOL 10 MG/ML IV BOLUS
INTRAVENOUS | Status: DC | PRN
  Administered 2021-07-17: 150 mg via INTRAVENOUS
  Administered 2021-07-17: 20 mg via INTRAVENOUS

## 2021-07-17 MED ORDER — IOHEXOL 350 MG/ML SOLN
100.0000 mL | Freq: Once | INTRAVENOUS | Status: AC | PRN
  Administered 2021-07-17: 12:00:00 100 mL via INTRAVENOUS

## 2021-07-17 MED ORDER — POLYETHYLENE GLYCOL 3350 17 G PO PACK: 17.0000 g | PACK | Freq: Every day | ORAL | Status: DC

## 2021-07-17 MED ORDER — SODIUM CHLORIDE 0.9 % IV SOLN: INTRAVENOUS | Status: DC

## 2021-07-17 MED ORDER — SUCCINYLCHOLINE CHLORIDE 200 MG/10ML IV SOSY
PREFILLED_SYRINGE | INTRAVENOUS | Status: DC | PRN
Start: 2021-07-17 — End: 2021-07-17
  Administered 2021-07-17: 140 mg via INTRAVENOUS

## 2021-07-17 MED ORDER — ASPIRIN 325 MG PO TABS: 325.0000 mg | ORAL_TABLET | Freq: Every day | ORAL | Status: DC

## 2021-07-17 MED ORDER — CLEVIDIPINE BUTYRATE 0.5 MG/ML IV EMUL
INTRAVENOUS | Status: DC | PRN
  Administered 2021-07-17: 2 mg/h via INTRAVENOUS

## 2021-07-17 MED ORDER — PROPOFOL 500 MG/50ML IV EMUL
INTRAVENOUS | Status: DC | PRN
  Administered 2021-07-17: 50 ug/kg/min via INTRAVENOUS

## 2021-07-17 MED ORDER — CLEVIDIPINE BUTYRATE 0.5 MG/ML IV EMUL: 0.0000 mg/h | INTRAVENOUS | Status: DC

## 2021-07-17 MED ORDER — SODIUM CHLORIDE 0.9 % IV SOLN: INTRAVENOUS | Status: DC | PRN

## 2021-07-17 MED ORDER — STROKE: EARLY STAGES OF RECOVERY BOOK
Freq: Once | Status: AC
  Filled 2021-07-17 (×2): qty 1

## 2021-07-17 MED ORDER — ROCURONIUM BROMIDE 10 MG/ML (PF) SYRINGE
PREFILLED_SYRINGE | INTRAVENOUS | Status: DC | PRN
  Administered 2021-07-17 (×2): 60 mg via INTRAVENOUS

## 2021-07-17 MED ORDER — ACETAMINOPHEN 650 MG RE SUPP: 650.0000 mg | RECTAL | Status: DC | PRN

## 2021-07-17 MED ORDER — CEFAZOLIN SODIUM-DEXTROSE 2-4 GM/100ML-% IV SOLN
2.0000 g | Freq: Once | INTRAVENOUS | Status: AC
  Administered 2021-07-17: 14:00:00 2 g via INTRAVENOUS
  Filled 2021-07-17: qty 100

## 2021-07-17 MED ORDER — SODIUM CHLORIDE 0.9 % IV BOLUS
500.0000 mL | Freq: Once | INTRAVENOUS | Status: AC
Start: 2021-07-17 — End: 2021-07-17
  Administered 2021-07-17: 13:00:00 500 mL via INTRAVENOUS

## 2021-07-17 MED ORDER — NOREPINEPHRINE 4 MG/250ML-% IV SOLN
2.0000 ug/min | INTRAVENOUS | Status: DC
  Administered 2021-07-17: 20:00:00 2 ug/min via INTRAVENOUS
  Filled 2021-07-17: qty 250

## 2021-07-17 MED ORDER — EPINEPHRINE HCL 5 MG/250ML IV SOLN IN NS
0.5000 ug/min | INTRAVENOUS | Status: DC
  Administered 2021-07-18: 4 ug/min via INTRAVENOUS
  Administered 2021-07-18: 11 ug/min via INTRAVENOUS
  Administered 2021-07-19: 12 ug/min via INTRAVENOUS
  Filled 2021-07-17 (×3): qty 250

## 2021-07-17 MED ORDER — LATANOPROST 0.005 % OP SOLN
1.0000 [drp] | Freq: Every day | OPHTHALMIC | Status: DC
  Administered 2021-07-18 – 2021-07-31 (×14): 1 [drp] via OPHTHALMIC
  Filled 2021-07-17: qty 2.5

## 2021-07-17 MED ORDER — IOHEXOL 350 MG/ML SOLN
70.0000 mL | Freq: Once | INTRAVENOUS | Status: AC | PRN
  Administered 2021-07-17: 18:00:00 70 mL via INTRAVENOUS

## 2021-07-17 MED ORDER — ROCURONIUM BROMIDE 100 MG/10ML IV SOLN
INTRAVENOUS | Status: DC | PRN
  Administered 2021-07-17: 20 mg via INTRAVENOUS
  Administered 2021-07-17: 100 mg via INTRAVENOUS
  Administered 2021-07-18: 50 mg via INTRAVENOUS

## 2021-07-17 NOTE — Progress Notes (Signed)
PCCM Brief Progress Note  # Massive but non-crashing PE: Some degree of hypotension is related to sedation but certainly concerned that his PE burden is contributory. - dc phenylephrine, switch to levo, wean for MAP 65 but ideally can keep SBP 120-140  - discussed with IR tentative plan for thrombectomy but will obtain CT Head first to eval for possibility of any significant hemorrhagic transformation - will discuss risks/benefits of starting heparin gtt following CTH - discussed CTA findings with family including patient's wife Darel Hong as well as tentative plan for thrombectomy pending CTH results  Jaime Whitaker Pulmonary/Critical Care

## 2021-07-17 NOTE — Anesthesia Procedure Notes (Signed)
Procedure Name: Intubation Date/Time: 07-20-21 2:46 PM Performed by: Arville Lime, CRNA Pre-anesthesia Checklist: Patient identified, Emergency Drugs available, Suction available and Patient being monitored Patient Re-evaluated:Patient Re-evaluated prior to induction Oxygen Delivery Method: Circle System Utilized Preoxygenation: Pre-oxygenation with 100% oxygen Induction Type: IV induction, Rapid sequence and Cricoid Pressure applied Tube type: Oral Tube size: 7.5 mm Number of attempts: 1 Airway Equipment and Method: Stylet Placement Confirmation: ETT inserted through vocal cords under direct vision, positive ETCO2 and breath sounds checked- equal and bilateral Secured at: 23 cm Tube secured with: Tape Dental Injury: Teeth and Oropharynx as per pre-operative assessment

## 2021-07-17 NOTE — ED Notes (Signed)
Lab staff to obtained labs at this time.
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
## 2021-07-17 NOTE — Progress Notes (Signed)
An USGPIV (ultrasound guided PIV) has been placed for short-term vasopressor infusion. A correctly placed ivWatch must be used when administering Vasopressors. Should this treatment be needed beyond 72 hours, central line access should be obtained.  It will be the responsibility of the bedside nurse to follow best practice to prevent extravasations.   ?

## 2021-07-17 NOTE — Anesthesia Preprocedure Evaluation (Addendum)
Anesthesia Evaluation  Patient identified by MRN, date of birth, ID band Patient unresponsive    Reviewed: Allergy & Precautions, Patient's Chart, lab work & pertinent test results, Unable to perform ROS - Chart review only  History of Anesthesia Complications Negative for: history of anesthetic complications  Airway Mallampati: Intubated       Dental   Pulmonary sleep apnea , COPD, former smoker, PE      + intubated    Cardiovascular hypertension, Pt. on medications and Pt. on home beta blockers  Rhythm:Regular Rate:Tachycardia     Neuro/Psych PSYCHIATRIC DISORDERS Depression CVA    GI/Hepatic   Endo/Other   Obesity   Renal/GU CRFRenal disease     Musculoskeletal  (+) Arthritis ,  PMR    Abdominal   Peds  Hematology  Plt 125k INR 1.4    Anesthesia Other Findings   Reproductive/Obstetrics                            Anesthesia Physical Anesthesia Plan  ASA: 4 and emergent  Anesthesia Plan: General   Post-op Pain Management:    Induction: Inhalational  PONV Risk Score and Plan: 2 and Treatment may vary due to age or medical condition  Airway Management Planned: Oral ETT  Additional Equipment: Arterial line  Intra-op Plan:   Post-operative Plan: Post-operative intubation/ventilation  Informed Consent:     History available from chart only and Only emergency history available  Plan Discussed with: CRNA and Anesthesiologist  Anesthesia Plan Comments:         Anesthesia Quick Evaluation

## 2021-07-17 NOTE — ED Notes (Signed)
Neuro RN states not to delay CT of head for IV/labs.

## 2021-07-17 NOTE — Sedation Documentation (Signed)
ACT = 293.

## 2021-07-17 NOTE — Anesthesia Preprocedure Evaluation (Signed)
Anesthesia Evaluation  Patient identified by MRN, date of birth, ID band Patient unresponsive    Reviewed: Allergy & Precautions, NPO status , Patient's Chart, lab work & pertinent test resultsPreop documentation limited or incomplete due to emergent nature of procedure.  Airway Mallampati: Unable to assess       Dental  (+) Poor Dentition   Pulmonary former smoker,    Pulmonary exam normal        Cardiovascular hypertension,  Rhythm:Regular Rate:Tachycardia     Neuro/Psych    GI/Hepatic   Endo/Other    Renal/GU      Musculoskeletal   Abdominal (+) + obese,   Peds  Hematology   Anesthesia Other Findings   Reproductive/Obstetrics                             Anesthesia Physical Anesthesia Plan  ASA: 4 and emergent  Anesthesia Plan: General   Post-op Pain Management: Minimal or no pain anticipated   Induction: Intravenous  PONV Risk Score and Plan: 2 and Treatment may vary due to age or medical condition  Airway Management Planned: Oral ETT  Additional Equipment: None  Intra-op Plan:   Post-operative Plan: Possible Post-op intubation/ventilation  Informed Consent: I have reviewed the patients History and Physical, chart, labs and discussed the procedure including the risks, benefits and alternatives for the proposed anesthesia with the patient or authorized representative who has indicated his/her understanding and acceptance.     Only emergency history available  Plan Discussed with: CRNA  Anesthesia Plan Comments:         Anesthesia Quick Evaluation

## 2021-07-17 NOTE — ED Notes (Signed)
Arrive to room 5 from CT at this time. Patient placed on monitoring equipment. Neurologist on screen to assess patient. Patient on NRB with sats in mid 80's. VS obtained and placed patient on cardiac monitor.

## 2021-07-17 NOTE — Code Documentation (Signed)
Patient arriving by [facility] from [facility] as a Code IR with history of HTN, Hypercholesteremia, Anemia, Sleep apnea, and Stroke. Per Report, Patient has been getting progressively weaker over the last couple days. Today, while with his wife, he had a sudden onset of unresponsiveness at 82.   Upon arrival to [facility], he was noted to have right sided weakness and global aphasia. Code Stroke activated and CT/CTA completed which confirmed Large Vessel Occlusion per MD Selina Cooley.   Patient transferred to [facility]. See Stroke Timeline for details. Initial NIHSS 18 for Right Arm and Leg Weakness, Right Visual Field Cut, Global Aphasia, and decreased sensation on the right.   Patient Arrived and taken directly to IR Suite. Handoff given to Tammy Sours, Charity fundraiser.

## 2021-07-17 NOTE — Sedation Documentation (Signed)
ACT 179 
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
## 2021-07-17 NOTE — ED Notes (Signed)
18 G IV to left AC started.

## 2021-07-17 NOTE — Sedation Documentation (Signed)
Information documented in Epic currently not appearing in IR Documentation tab.

## 2021-07-17 NOTE — ED Notes (Signed)
Patient taken to CT at this time for angio.

## 2021-07-17 NOTE — H&P (Signed)
Neurology H&P  CC: Right-sided weakness and aphasia  History is obtained from: Chart review  HPI: Jaime Whitaker is a 80 y.o. male with a history of hypertension, hyperlipidemia.  History is obtained from the consult note by Dr. Quinn Axe earlier.  He apparently has been having some problems for the past couple of days, but had an abrupt worsening at 10:40 AM this morning.  Given that he had some symptoms yesterday, TNKase was not administered.  He was on Eliquis for DVT, but was taken off of it for a GI procedure.  He was taken off of it on 12/18.    LKW: 12/20 tpa given?: No, outside window IR Thrombectomy?  Yes Modified Rankin Scale: 2-Slight disability-UNABLE to perform all activities but does not need assistance NIHSS: 18   ROS: A complete ROS was performed and is negative except as noted in the HPI.  Past Medical History:  Diagnosis Date   Atherosclerosis    Emphysema lung (HCC)    Hypercholesteremia    Hypertension    Iron deficiency anemia    Lumbar compression fracture ([place]) 10/2020   T1 T2 Lumbar fracture   Major depressive disorder    Polymyalgia rheumatica ([place])    Sleep apnea    Stroke ([place]) 1994     History reviewed. No pertinent family history.   Social History:  reports that he has quit smoking. His smoking use included cigarettes. He has never used smokeless tobacco. He reports that he does not drink alcohol and does not use drugs.   Prior to Admission medications   Medication Sig Start Date End Date Taking? Authorizing Provider  Bismuth 262 MG CHEW Chew 2 each by mouth every 6 (six) hours. Patient taking differently: Chew 2 each by mouth every 6 (six) hours as needed (heartburn). 06/17/21  Yes Harvel Quale, MD  brimonidine (ALPHAGAN) 0.2 % ophthalmic solution Place 1 drop into both eyes daily. 05/28/21  Yes [provider]  furosemide (LASIX) 40 MG tablet Take 40 mg by mouth daily.   Yes [provider]  ibuprofen (ADVIL) 200 MG  tablet Take 200-600 mg by mouth every 6 (six) hours as needed for moderate pain.   Yes [provider]  IFEREX 150 150 MG capsule Take 150 mg by mouth 2 (two) times daily. 06/23/21  Yes [provider]  latanoprost (XALATAN) 0.005 % ophthalmic solution Place 1 drop into both eyes at bedtime. 03/30/21  Yes [provider]  lovastatin (MEVACOR) 20 MG tablet Take 20 mg by mouth at bedtime.   Yes [provider]  metoprolol (LOPRESSOR) 50 MG tablet Take 50 mg by mouth daily.   Yes [provider]  Multiple Vitamins-Minerals (CENTRUM MEN PO) Take 1 tablet by mouth daily.   Yes [provider]  Multiple Vitamins-Minerals (ICAPS AREDS 2 PO) Take 1 capsule by mouth 2 (two) times daily.   Yes [provider]  omeprazole (PRILOSEC) 40 MG capsule Take 1 capsule (40 mg total) by mouth 2 (two) times daily for 14 days. 06/14/21 07/11/2021 Yes Harvel Quale, MD  predniSONE (DELTASONE) 5 MG tablet Take 5 mg by mouth daily. 07/15/21  Yes [provider]  promethazine (PHENERGAN) 25 MG tablet Take 25 mg by mouth every 6 (six) hours as needed for nausea or vomiting.   Yes [provider]  ferrous sulfate 325 (65 FE) MG EC tablet Take 1 tablet (325 mg total) by mouth 2 (two) times daily. Patient not taking: Reported on 07/06/2021  06/03/21 09/01/21  Raquel James, NP     Exam: Current vital signs: BP 108/79    Pulse (!) 105    Resp 16    Ht 5\' 9"  (1.753 m)    Wt 104.3 kg    SpO2 96%    BMI 33.96 kg/m    Physical Exam  Constitutional: Appears well-developed and well-nourished.  Psych: Affect appropriate to situation Eyes: No scleral injection HENT: No OP obstrucion Head: Normocephalic.  Cardiovascular: Normal rate and regular rhythm.  Respiratory: Effort normal and breath sounds normal to anterior ascultation GI: Soft.  No distension. There is no tenderness.  Skin: WDI Extremities: 2+ edema  Neuro: Mental  Status: He has a severe aphasia with no verbal output and no following commands Cranial Nerves: II: Visual Fields are full. Pupils are equal, round, and reactive to light.   III,IV, VI: Eyes are disconjugate with the left eye not crossing midline to the right, the right eye, however does cross midline to the right but has a leftward gaze preference.  VII: Facial movement with right facial weakness Motor: He has a significant right hemiparesis, though he is able to give some effort against gravity in both the arm and leg.  He has good strength on the left. Sensory: Sensation is symmetric to light touch and temperature in the arms and legs. Cerebellar: Does not perform, no clear ataxia out of proportion to weakness   I have reviewed labs in epic and the pertinent results are: Creatinine 1.65  I have reviewed the images obtained: CT perfusion-suggestive of large penumbra with no core.  Aspects of 10.  Primary Diagnosis:  Cerebral infarction due to embolism of  left middle cerebral artery.   Secondary Diagnosis: Essential (primary) hypertension and Acute Kidney Failure   Impression: 80 year old male with acute ischemic infarct in the setting of recently stopping Eliquis for GI procedure.  He does have a high oxygen requirement and I appreciate pulmonary's involvement.  With that combination of symptoms, I think paradoxical embolus certainly would be a consideration.  He is on chronic prednisone presumably for PMR, I will continue his home dose.  Plan: - HgbA1c, fasting lipid panel - MRI of the brain without contrast - Frequent neuro checks - Echocardiogram - Prophylactic therapy-Antiplatelet med: Aspirin - dose 325mg  daiily - Risk factor modification - Telemetry monitoring - PT consult, OT consult, Speech consult - Stroke team to follow  Polymyalgia rheumatica -Continue prednisone 5 mg daily  -Continue eyedrops  This patient is critically ill and at significant risk of  neurological worsening, death and care requires constant monitoring of vital signs, hemodynamics,respiratory and cardiac monitoring, neurological assessment, discussion with family, other specialists and medical decision making of high complexity. I spent 57 minutes of neurocritical care time  in the care of  this patient. This was time spent independent of any time provided by nurse practitioner or PA.  70, MD [facility] [phone]  If 7pm- 7am, please page neurology on call as listed in AMION.

## 2021-07-17 NOTE — ED Notes (Signed)
Patient returned to room two at this time. Report given to receiving nurse. Neurologist on screen speaking with family. Dr. Jeraldine Loots at bedside.
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
## 2021-07-17 NOTE — Transfer of Care (Signed)
Immediate Anesthesia Transfer of Care Note  Patient: Jaime Whitaker  Procedure(s) Performed: IR WITH ANESTHESIA  Patient Location: ICU  Anesthesia Type:General  Level of Consciousness: sedated and Patient remains intubated per anesthesia plan  Airway & Oxygen Therapy: Patient remains intubated per anesthesia plan and Patient placed on Ventilator (see vital sign flow sheet for setting)  Post-op Assessment: Report given to RN and Post -op Vital signs reviewed and stable  Post vital signs: Reviewed and stable  Last Vitals:  Vitals Value Taken Time  BP 133/91 07-23-2021 1540  Temp    Pulse 113 07-23-21 1552  Resp 19 07/23/2021 1552  SpO2 94 % 07/23/21 1552  Vitals shown include unvalidated device data.  Last Pain:  Vitals:   07/23/21 1246  PainSc: 0-No pain         Complications: No notable events documented.

## 2021-07-17 NOTE — ED Notes (Signed)
Patient in need of 18g IV for perfusion scan. Failed attempts by this Clinical research associate and other RN while patient on CT table. Awaiting RN with ultrasound at this time. Patient remains stable on cardiac monitor on CT table.

## 2021-07-17 NOTE — Progress Notes (Signed)
Patient ID: Jaime Whitaker, male   DOB: [birth year], 80 y.o.   MRN: [id number] INR. 41 Y RT H M MRS ?2  LSW ? New onset Lt gaze deviation ,RT hemiplegia and aphasia.  CT brain No ICH ASPECTS 10  CTA occluded Lt MCA M1. CTP no core with a penumbra of 118 ml. Option of endovascular treatment D/W daughter over the phone. Procedure,reasons,risks and alternatives reviewed. Risks of ICH of 10 %,worsening neuro deficit ,death and inability to revascularize discussed. Daughter expressed understanding,and provided consent to proceed. S.Talissa Apple MD

## 2021-07-17 NOTE — Sedation Documentation (Signed)
58/22 (35) PAP

## 2021-07-17 NOTE — Progress Notes (Signed)
ANTICOAGULATION CONSULT NOTE - Initial Consult  Pharmacy Consult:  Heparin Indication: Acute bilateral PEs and history of DVT  No Known Allergies  Patient Measurements: Height: 5\' 9"  (175.3 cm) Weight: 98.9 kg (218 lb 0.6 oz) IBW/kg (Calculated) : 70.7 Heparin Dosing Weight: 91 kg  Vital Signs: Temp: 98.1 F (36.7 C) (12/21 1550) Temp Source: Axillary (12/21 1550) BP: 110/85 (12/21 1900) Pulse Rate: 106 (12/21 1830)  Labs: Recent Labs    07/16/21 1145 07/16/21 1146 2021-08-03 1122  HGB 15.3  --  14.9  HCT 48.1  --  46.3  PLT 141*  --  125*  APTT  --   --  31  LABPROT  --   --  16.8*  INR  --   --  1.4*  CREATININE  --  1.46* 1.65*    Estimated Creatinine Clearance: 42.8 mL/min (A) (by C-G formula based on SCr of 1.65 mg/dL (H)).   Medical History: Past Medical History:  Diagnosis Date   Atherosclerosis    Emphysema lung (HCC)    Hypercholesteremia    Hypertension    Iron deficiency anemia    Lumbar compression fracture (HCC) 10/2020   T1 T2 Lumbar fracture   Major depressive disorder    Polymyalgia rheumatica (HCC)    Sleep apnea    Stroke (HCC) 1994     Assessment: 70 YOM presented with right-sided weakness and aphagia, underwent revascularization of L MCA.  Also found to have acute bilateral PEs in setting of recent DVT, so Pharmacy consulted to dose IV heparin.  He was on Eliquis PTA, but taken off on 12/18 for EGD.  Per discussion with CCM, OK to start IV heparin while awaiting CT result given burden of PE.  Baseline labs reviewed.  Goal of Therapy:  Heparin level 0.-0.5 units/ml Monitor platelets by anticoagulation protocol: Yes   Plan:  Heparin gtt at 1100 units/hr - no bolus with CVA Check 8 hr heparin level and aPTT Daily heparin level and CBC  Isak Sotomayor D. 1/19, PharmD, BCPS, BCCCP 08/03/2021, 7:44 PM

## 2021-07-17 NOTE — Procedures (Signed)
Lt common carotid arteriogram . RT CFA approach. Findings. Occluded inf division Lt MCA . S/P complete revascularization of occluded Lt MCA indf division with x 1 pass with 40 mm solitaireX retriever and contact aspiration achieving a TICI 3 revascularization  Post CT brain  83F angioseal  for hemostasis at RT CFA site with manualal compression with quick clot for 15 mins. Distal pulses all dopplerable in both feet. Left intubated as patient unable to maintain adequate O2 sats after reversal as per anesthesia. [name] S.Abbagale Goguen MD

## 2021-07-17 NOTE — ED Notes (Signed)
CT angio complete.
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
## 2021-07-17 NOTE — ED Triage Notes (Signed)
Pt to the ED [facility] reports of rt side facial droop and flaccidness. Wife is a poor historian but reports LKW at 1040 today  Pt has been weak for the last several days and was getting dressed and went unresponsive.  Ems reports some resistance on the left but total flaccidity on the right side of his body. CBG 120 on arrival.

## 2021-07-18 ENCOUNTER — Encounter (HOSPITAL_COMMUNITY): Payer: Self-pay | Admitting: Interventional Radiology

## 2021-07-18 ENCOUNTER — Inpatient Hospital Stay (HOSPITAL_COMMUNITY): Payer: Medicare HMO

## 2021-07-18 ENCOUNTER — Encounter (HOSPITAL_COMMUNITY): Payer: Self-pay | Admitting: Anesthesiology

## 2021-07-18 ENCOUNTER — Ambulatory Visit (HOSPITAL_COMMUNITY): Payer: Medicare HMO

## 2021-07-18 DIAGNOSIS — R578 Other shock: Secondary | ICD-10-CM

## 2021-07-18 DIAGNOSIS — I63312 Cerebral infarction due to thrombosis of left middle cerebral artery: Secondary | ICD-10-CM

## 2021-07-18 DIAGNOSIS — J9602 Acute respiratory failure with hypercapnia: Secondary | ICD-10-CM | POA: Diagnosis not present

## 2021-07-18 DIAGNOSIS — J9601 Acute respiratory failure with hypoxia: Secondary | ICD-10-CM | POA: Diagnosis not present

## 2021-07-18 DIAGNOSIS — I2699 Other pulmonary embolism without acute cor pulmonale: Secondary | ICD-10-CM

## 2021-07-18 DIAGNOSIS — I6389 Other cerebral infarction: Secondary | ICD-10-CM

## 2021-07-18 HISTORY — PX: IR THROMBECT PRIM MECH INIT (INCLU) MOD SED: IMG2297

## 2021-07-18 HISTORY — PX: IR RADIOLOGIST EVAL & MGMT: IMG5224

## 2021-07-18 HISTORY — PX: IR ANGIOGRAM PULMONARY BILATERAL SELECTIVE: IMG664

## 2021-07-18 HISTORY — PX: IR US GUIDE VASC ACCESS RIGHT: IMG2390

## 2021-07-18 LAB — BASIC METABOLIC PANEL
Anion gap: 17 — ABNORMAL HIGH (ref 5–15)
BUN: 20 mg/dL (ref 8–23)
CO2: 17 mmol/L — ABNORMAL LOW (ref 22–32)
Calcium: 7.6 mg/dL — ABNORMAL LOW (ref 8.9–10.3)
Chloride: 105 mmol/L (ref 98–111)
Creatinine, Ser: 2 mg/dL — ABNORMAL HIGH (ref 0.61–1.24)
GFR, Estimated: 34 mL/min — ABNORMAL LOW (ref >60)
Glucose, Bld: 313 mg/dL — ABNORMAL HIGH (ref 70–99)
Potassium: 4.5 mmol/L (ref 3.5–5.1)
Sodium: 139 mmol/L (ref 135–145)

## 2021-07-18 LAB — POCT I-STAT 7, (LYTES, BLD GAS, ICA,H+H)
Acid-base deficit: 7 mmol/L — ABNORMAL HIGH (ref 0.0–2.0)
Acid-base deficit: 7 mmol/L — ABNORMAL HIGH (ref 0.0–2.0)
Acid-base deficit: 8 mmol/L — ABNORMAL HIGH (ref 0.0–2.0)
Bicarbonate: 25.2 mmol/L (ref 20.0–28.0)
Bicarbonate: 26.3 mmol/L (ref 20.0–28.0)
Bicarbonate: 19.1 mmol/L — ABNORMAL LOW (ref 20.0–28.0)
Calcium, Ion: 0.99 mmol/L — ABNORMAL LOW (ref 1.15–1.40)
Calcium, Ion: 1.06 mmol/L — ABNORMAL LOW (ref 1.15–1.40)
Calcium, Ion: 1.03 mmol/L — ABNORMAL LOW (ref 1.15–1.40)
HCT: 35 % — ABNORMAL LOW (ref 39.0–52.0)
HCT: 38 % — ABNORMAL LOW (ref 39.0–52.0)
HCT: 38 % — ABNORMAL LOW (ref 39.0–52.0)
Hemoglobin: 11.9 g/dL — ABNORMAL LOW (ref 13.0–17.0)
Hemoglobin: 12.9 g/dL — ABNORMAL LOW (ref 13.0–17.0)
Hemoglobin: 12.9 g/dL — ABNORMAL LOW (ref 13.0–17.0)
O2 Saturation: 58 %
O2 Saturation: 61 %
O2 Saturation: 99 %
Patient temperature: 98.6
Patient temperature: 98.7
Patient temperature: 98.1
Potassium: 4.6 mmol/L (ref 3.5–5.1)
Potassium: 4.8 mmol/L (ref 3.5–5.1)
Potassium: 4 mmol/L (ref 3.5–5.1)
Sodium: 144 mmol/L (ref 135–145)
Sodium: 146 mmol/L — ABNORMAL HIGH (ref 135–145)
Sodium: 142 mmol/L (ref 135–145)
TCO2: 28 mmol/L (ref 22–32)
TCO2: 29 mmol/L (ref 22–32)
TCO2: 20 mmol/L — ABNORMAL LOW (ref 22–32)
pCO2 arterial: 96.2 mmHg (ref 32.0–48.0)
pCO2 arterial: 97.7 mmHg (ref 32.0–48.0)
pCO2 arterial: 43.8 mmHg (ref 32.0–48.0)
pH, Arterial: 7.027 — CL (ref 7.350–7.450)
pH, Arterial: 7.037 — CL (ref 7.350–7.450)
pH, Arterial: 7.247 — ABNORMAL LOW (ref 7.350–7.450)
pO2, Arterial: 46 mmHg — ABNORMAL LOW (ref 83.0–108.0)
pO2, Arterial: 47 mmHg — ABNORMAL LOW (ref 83.0–108.0)
pO2, Arterial: 188 mmHg — ABNORMAL HIGH (ref 83.0–108.0)

## 2021-07-18 LAB — PROTIME-INR
INR: 1.6 — ABNORMAL HIGH (ref 0.8–1.2)
Prothrombin Time: 19 seconds — ABNORMAL HIGH (ref 11.4–15.2)

## 2021-07-18 LAB — HEMOGLOBIN A1C
Hgb A1c MFr Bld: 7.4 % — ABNORMAL HIGH (ref 4.8–5.6)
Mean Plasma Glucose: 165.68 mg/dL

## 2021-07-18 LAB — ECHOCARDIOGRAM COMPLETE BUBBLE STUDY: S' Lateral: 2.6 cm

## 2021-07-18 LAB — MAGNESIUM
Magnesium: 2 mg/dL (ref 1.7–2.4)
Magnesium: 2.8 mg/dL — ABNORMAL HIGH (ref 1.7–2.4)

## 2021-07-18 LAB — PHOSPHORUS
Phosphorus: 6.3 mg/dL — ABNORMAL HIGH (ref 2.5–4.6)
Phosphorus: 3.8 mg/dL (ref 2.5–4.6)

## 2021-07-18 LAB — CBC WITH DIFFERENTIAL/PLATELET
Abs Immature Granulocytes: 0.14 10*3/uL — ABNORMAL HIGH (ref 0.00–0.07)
Basophils Absolute: 0 10*3/uL (ref 0.0–0.1)
Basophils Relative: 0 %
Eosinophils Absolute: 0 10*3/uL (ref 0.0–0.5)
Eosinophils Relative: 0 %
HCT: 40.4 % (ref 39.0–52.0)
Hemoglobin: 12.6 g/dL — ABNORMAL LOW (ref 13.0–17.0)
Immature Granulocytes: 1 %
Lymphocytes Relative: 4 %
Lymphs Abs: 0.6 10*3/uL — ABNORMAL LOW (ref 0.7–4.0)
MCH: 29.2 pg (ref 26.0–34.0)
MCHC: 31.2 g/dL (ref 30.0–36.0)
MCV: 93.7 fL (ref 80.0–100.0)
Monocytes Absolute: 1.7 10*3/uL — ABNORMAL HIGH (ref 0.1–1.0)
Monocytes Relative: 10 %
Neutro Abs: 14 10*3/uL — ABNORMAL HIGH (ref 1.7–7.7)
Neutrophils Relative %: 85 %
Platelets: 131 10*3/uL — ABNORMAL LOW (ref 150–400)
RBC: 4.31 MIL/uL (ref 4.22–5.81)
RDW: 17.9 % — ABNORMAL HIGH (ref 11.5–15.5)
WBC: 16.4 10*3/uL — ABNORMAL HIGH (ref 4.0–10.5)
nRBC: 0 % (ref 0.0–0.2)

## 2021-07-18 LAB — POCT ACTIVATED CLOTTING TIME
Activated Clotting Time: 179 seconds
Activated Clotting Time: 293 seconds

## 2021-07-18 LAB — HEPARIN LEVEL (UNFRACTIONATED): Heparin Unfractionated: 0.38 IU/mL (ref 0.30–0.70)

## 2021-07-18 LAB — APTT
aPTT: 40 seconds — ABNORMAL HIGH (ref 24–36)
aPTT: 82 seconds — ABNORMAL HIGH (ref 24–36)

## 2021-07-18 LAB — LIPID PANEL
Cholesterol: 131 mg/dL (ref 0–200)
HDL: 40 mg/dL — ABNORMAL LOW (ref >40)
LDL Cholesterol: 71 mg/dL (ref 0–99)
Total CHOL/HDL Ratio: 3.3 RATIO
Triglycerides: 98 mg/dL (ref <150)
VLDL: 20 mg/dL (ref 0–40)

## 2021-07-18 LAB — GLUCOSE, CAPILLARY
Glucose-Capillary: 175 mg/dL — ABNORMAL HIGH (ref 70–99)
Glucose-Capillary: 287 mg/dL — ABNORMAL HIGH (ref 70–99)
Glucose-Capillary: 245 mg/dL — ABNORMAL HIGH (ref 70–99)
Glucose-Capillary: 236 mg/dL — ABNORMAL HIGH (ref 70–99)

## 2021-07-18 MED ORDER — PROSOURCE TF PO LIQD
45.0000 mL | Freq: Two times a day (BID) | ORAL | Status: DC
  Filled 2021-07-18: qty 45

## 2021-07-18 MED ORDER — CALCIUM GLUCONATE-NACL 1-0.675 GM/50ML-% IV SOLN
1.0000 g | Freq: Once | INTRAVENOUS | Status: AC
  Administered 2021-07-18: 05:00:00 1000 mg via INTRAVENOUS
  Filled 2021-07-18: qty 50

## 2021-07-18 MED ORDER — POLYETHYLENE GLYCOL 3350 17 G PO PACK
17.0000 g | PACK | Freq: Every day | ORAL | Status: DC
  Administered 2021-07-19 – 2021-07-25 (×3): 17 g
  Filled 2021-07-18 (×3): qty 1

## 2021-07-18 MED ORDER — HEPARIN (PORCINE) 25000 UT/250ML-% IV SOLN
1600.0000 [IU]/h | INTRAVENOUS | Status: AC
  Administered 2021-07-18: 08:00:00 1100 [IU]/h via INTRAVENOUS
  Administered 2021-07-19 – 2021-07-20 (×2): 1300 [IU]/h via INTRAVENOUS
  Administered 2021-07-21: 01:00:00 1400 [IU]/h via INTRAVENOUS
  Administered 2021-07-21 – 2021-07-26 (×8): 1600 [IU]/h via INTRAVENOUS
  Filled 2021-07-18 (×12): qty 250

## 2021-07-18 MED ORDER — CHLORHEXIDINE GLUCONATE CLOTH 2 % EX PADS
6.0000 | MEDICATED_PAD | Freq: Every day | CUTANEOUS | Status: DC
  Administered 2021-07-19 – 2021-07-27 (×11): 6 via TOPICAL

## 2021-07-18 MED ORDER — ASPIRIN 81 MG PO CHEW
81.0000 mg | CHEWABLE_TABLET | Freq: Every day | ORAL | Status: DC
Start: 2021-07-19 — End: 2021-07-31
  Administered 2021-07-19 – 2021-07-31 (×13): 81 mg
  Filled 2021-07-18 (×13): qty 1

## 2021-07-18 MED ORDER — TENECTEPLASE 50 MG IV KIT
50.0000 mg | PACK | INTRAVENOUS | Status: AC
  Administered 2021-07-18: 02:00:00 50 mg via INTRAVENOUS
  Filled 2021-07-18: qty 10

## 2021-07-18 MED ORDER — IOHEXOL 300 MG/ML  SOLN
100.0000 mL | Freq: Once | INTRAMUSCULAR | Status: AC | PRN
  Administered 2021-07-18: 01:00:00 40 mL via INTRA_ARTERIAL

## 2021-07-18 MED ORDER — VITAL HIGH PROTEIN PO LIQD: 1000.0000 mL | ORAL | Status: DC

## 2021-07-18 MED ORDER — ORAL CARE MOUTH RINSE
15.0000 mL | OROMUCOSAL | Status: DC
  Administered 2021-07-18 – 2021-08-01 (×141): 15 mL via OROMUCOSAL

## 2021-07-18 MED ORDER — ATROPINE SULFATE 0.4 MG/ML IV SOLN
INTRAVENOUS | Status: DC | PRN
  Administered 2021-07-18: .2 mg via INTRAVENOUS

## 2021-07-18 MED ORDER — MIDAZOLAM HCL 2 MG/2ML IJ SOLN
2.0000 mg | Freq: Once | INTRAMUSCULAR | Status: AC
  Administered 2021-07-18: 11:00:00 2 mg via INTRAVENOUS

## 2021-07-18 MED ORDER — PANTOPRAZOLE SODIUM 40 MG IV SOLR
40.0000 mg | Freq: Every day | INTRAVENOUS | Status: DC
  Administered 2021-07-18: 22:00:00 40 mg via INTRAVENOUS
  Filled 2021-07-18: qty 40

## 2021-07-18 MED ORDER — POLYETHYLENE GLYCOL 3350 17 G PO PACK: 17.0000 g | PACK | Freq: Every day | ORAL | Status: DC

## 2021-07-18 MED ORDER — FENTANYL CITRATE PF 50 MCG/ML IJ SOSY: 100.0000 ug | PREFILLED_SYRINGE | Freq: Once | INTRAMUSCULAR | Status: DC

## 2021-07-18 MED ORDER — ESMOLOL HCL 100 MG/10ML IV SOLN
INTRAVENOUS | Status: DC | PRN
  Administered 2021-07-18: 20 mg via INTRAVENOUS

## 2021-07-18 MED ORDER — EPHEDRINE SULFATE 50 MG/ML IJ SOLN
INTRAMUSCULAR | Status: DC | PRN
  Administered 2021-07-18: 2.5 mg via INTRAVENOUS
  Administered 2021-07-18: 5 mg via INTRAVENOUS

## 2021-07-18 MED ORDER — FENTANYL CITRATE PF 50 MCG/ML IJ SOSY
PREFILLED_SYRINGE | INTRAMUSCULAR | Status: AC
  Administered 2021-07-18: 15:00:00 50 ug
  Filled 2021-07-18: qty 2

## 2021-07-18 MED ORDER — EPINEPHRINE 1 MG/10ML IJ SOSY
PREFILLED_SYRINGE | INTRAMUSCULAR | Status: DC | PRN
  Administered 2021-07-18 (×3): .3 mg via INTRAVENOUS

## 2021-07-18 MED ORDER — MIDAZOLAM HCL 2 MG/2ML IJ SOLN: 4.0000 mg | Freq: Once | INTRAMUSCULAR | Status: DC

## 2021-07-18 MED ORDER — FENTANYL CITRATE (PF) 100 MCG/2ML IJ SOLN
INTRAMUSCULAR | Status: AC
  Administered 2021-07-18: 15:00:00 100 ug
  Filled 2021-07-18: qty 2

## 2021-07-18 MED ORDER — ONDANSETRON HCL 4 MG/2ML IJ SOLN: 4.0000 mg | Freq: Four times a day (QID) | INTRAMUSCULAR | Status: DC | PRN

## 2021-07-18 MED ORDER — PERFLUTREN LIPID MICROSPHERE
1.0000 mL | INTRAVENOUS | Status: AC | PRN
  Administered 2021-07-18: 10:00:00 2 mL via INTRAVENOUS
  Filled 2021-07-18: qty 10

## 2021-07-18 MED ORDER — BUSPIRONE HCL 15 MG PO TABS
30.0000 mg | ORAL_TABLET | Freq: Three times a day (TID) | ORAL | Status: DC
  Filled 2021-07-18: qty 2

## 2021-07-18 MED ORDER — DOCUSATE SODIUM 50 MG/5ML PO LIQD: 100.0000 mg | Freq: Two times a day (BID) | ORAL | Status: DC

## 2021-07-18 MED ORDER — ROCURONIUM BROMIDE 10 MG/ML (PF) SYRINGE
PREFILLED_SYRINGE | INTRAVENOUS | Status: AC
  Administered 2021-07-18: 11:00:00 80 mg
  Filled 2021-07-18: qty 10

## 2021-07-18 MED ORDER — ASPIRIN 300 MG RE SUPP: 300.0000 mg | RECTAL | Status: DC

## 2021-07-18 MED ORDER — FENTANYL BOLUS VIA INFUSION
25.0000 ug | INTRAVENOUS | Status: DC | PRN
  Administered 2021-07-18 (×4): 50 ug via INTRAVENOUS
  Filled 2021-07-18: qty 100

## 2021-07-18 MED ORDER — ACETAMINOPHEN 650 MG RE SUPP: 650.0000 mg | RECTAL | Status: DC

## 2021-07-18 MED ORDER — IOHEXOL 300 MG/ML  SOLN
100.0000 mL | Freq: Once | INTRAMUSCULAR | Status: AC | PRN
  Administered 2021-07-18: 01:00:00 60 mL via INTRA_ARTERIAL

## 2021-07-18 MED ORDER — PROSOURCE TF PO LIQD
45.0000 mL | Freq: Three times a day (TID) | ORAL | Status: DC
  Administered 2021-07-18 – 2021-07-31 (×41): 45 mL
  Filled 2021-07-18 (×37): qty 45

## 2021-07-18 MED ORDER — ACETAMINOPHEN 160 MG/5ML PO SOLN
650.0000 mg | ORAL | Status: DC
  Administered 2021-07-18 – 2021-07-24 (×30): 650 mg
  Filled 2021-07-18 (×32): qty 20.3

## 2021-07-18 MED ORDER — ASPIRIN 325 MG PO TABS: 325.0000 mg | ORAL_TABLET | ORAL | Status: DC

## 2021-07-18 MED ORDER — ROCURONIUM BROMIDE 10 MG/ML (PF) SYRINGE: 80.0000 mg | PREFILLED_SYRINGE | Freq: Once | INTRAVENOUS | Status: DC

## 2021-07-18 MED ORDER — BUSPIRONE HCL 15 MG PO TABS
30.0000 mg | ORAL_TABLET | Freq: Three times a day (TID) | ORAL | Status: DC
  Administered 2021-07-18 – 2021-07-24 (×19): 30 mg
  Filled 2021-07-18 (×18): qty 2

## 2021-07-18 MED ORDER — VITAL 1.5 CAL PO LIQD
1000.0000 mL | ORAL | Status: DC
  Administered 2021-07-18 – 2021-07-30 (×14): 1000 mL

## 2021-07-18 MED ORDER — PROPOFOL 10 MG/ML IV BOLUS
INTRAVENOUS | Status: AC
  Filled 2021-07-18: qty 20

## 2021-07-18 MED ORDER — LIDOCAINE HCL 1 % IJ SOLN
INTRAMUSCULAR | Status: AC
  Filled 2021-07-18: qty 20

## 2021-07-18 MED ORDER — ACETAMINOPHEN 325 MG PO TABS: 650.0000 mg | ORAL_TABLET | ORAL | Status: DC

## 2021-07-18 MED ORDER — FENTANYL 2500MCG IN NS 250ML (10MCG/ML) PREMIX INFUSION
25.0000 ug/h | INTRAVENOUS | Status: DC
  Administered 2021-07-18: 17:00:00 50 ug/h via INTRAVENOUS
  Administered 2021-07-19: 07:00:00 150 ug/h via INTRAVENOUS
  Administered 2021-07-20 – 2021-07-21 (×2): 100 ug/h via INTRAVENOUS
  Filled 2021-07-18 (×5): qty 250

## 2021-07-18 MED ORDER — MAGNESIUM SULFATE 2 GM/50ML IV SOLN
2.0000 g | Freq: Once | INTRAVENOUS | Status: AC
  Administered 2021-07-18: 12:00:00 2 g via INTRAVENOUS
  Filled 2021-07-18: qty 50

## 2021-07-18 MED ORDER — SODIUM CHLORIDE 0.9 % IV SOLN: INTRAVENOUS | Status: DC

## 2021-07-18 MED ORDER — CHLORHEXIDINE GLUCONATE 4 % EX LIQD
CUTANEOUS | Status: AC
  Filled 2021-07-18: qty 15

## 2021-07-18 MED ORDER — PROPOFOL 1000 MG/100ML IV EMUL
5.0000 ug/kg/min | INTRAVENOUS | Status: DC
Start: 2021-07-18 — End: 2021-07-21
  Administered 2021-07-18: 23:00:00 5 ug/kg/min via INTRAVENOUS
  Administered 2021-07-19 – 2021-07-20 (×3): 10 ug/kg/min via INTRAVENOUS
  Filled 2021-07-18 (×4): qty 100

## 2021-07-18 MED ORDER — FENTANYL CITRATE PF 50 MCG/ML IJ SOSY
100.0000 ug | PREFILLED_SYRINGE | Freq: Once | INTRAMUSCULAR | Status: AC
  Administered 2021-07-18: 11:00:00 100 ug via INTRAVENOUS

## 2021-07-18 MED ORDER — DOCUSATE SODIUM 50 MG/5ML PO LIQD
100.0000 mg | Freq: Two times a day (BID) | ORAL | Status: DC
  Administered 2021-07-18 – 2021-07-25 (×8): 100 mg
  Filled 2021-07-18 (×8): qty 10

## 2021-07-18 MED ORDER — INSULIN ASPART 100 UNIT/ML IJ SOLN
0.0000 [IU] | INTRAMUSCULAR | Status: DC
Start: 2021-07-18 — End: 2021-07-29
  Administered 2021-07-18: 21:00:00 3 [IU] via SUBCUTANEOUS
  Administered 2021-07-18 (×2): 5 [IU] via SUBCUTANEOUS
  Administered 2021-07-18: 08:00:00 8 [IU] via SUBCUTANEOUS
  Administered 2021-07-19 (×3): 3 [IU] via SUBCUTANEOUS
  Administered 2021-07-19 (×2): 8 [IU] via SUBCUTANEOUS
  Administered 2021-07-19 (×2): 5 [IU] via SUBCUTANEOUS
  Administered 2021-07-20 (×2): 3 [IU] via SUBCUTANEOUS
  Administered 2021-07-20: 16:00:00 5 [IU] via SUBCUTANEOUS
  Administered 2021-07-20: 3 [IU] via SUBCUTANEOUS
  Administered 2021-07-20: 20:00:00 5 [IU] via SUBCUTANEOUS
  Administered 2021-07-20 – 2021-07-21 (×2): 3 [IU] via SUBCUTANEOUS
  Administered 2021-07-21: 17:00:00 2 [IU] via SUBCUTANEOUS
  Administered 2021-07-21: 09:00:00 5 [IU] via SUBCUTANEOUS
  Administered 2021-07-21 – 2021-07-22 (×4): 3 [IU] via SUBCUTANEOUS
  Administered 2021-07-22 (×2): 5 [IU] via SUBCUTANEOUS
  Administered 2021-07-22 – 2021-07-23 (×3): 3 [IU] via SUBCUTANEOUS
  Administered 2021-07-23: 11:00:00 5 [IU] via SUBCUTANEOUS
  Administered 2021-07-23 (×2): 3 [IU] via SUBCUTANEOUS
  Administered 2021-07-23: 06:00:00 2 [IU] via SUBCUTANEOUS
  Administered 2021-07-23: 20:00:00 8 [IU] via SUBCUTANEOUS
  Administered 2021-07-24 (×2): 5 [IU] via SUBCUTANEOUS
  Administered 2021-07-24: 08:00:00 3 [IU] via SUBCUTANEOUS
  Administered 2021-07-24 (×2): 5 [IU] via SUBCUTANEOUS
  Administered 2021-07-24: 04:00:00 2 [IU] via SUBCUTANEOUS
  Administered 2021-07-25 (×2): 5 [IU] via SUBCUTANEOUS
  Administered 2021-07-25: 05:00:00 3 [IU] via SUBCUTANEOUS
  Administered 2021-07-25 (×2): 5 [IU] via SUBCUTANEOUS
  Administered 2021-07-25 – 2021-07-26 (×2): 3 [IU] via SUBCUTANEOUS
  Administered 2021-07-26 (×2): 5 [IU] via SUBCUTANEOUS
  Administered 2021-07-26: 23:00:00 3 [IU] via SUBCUTANEOUS
  Administered 2021-07-26: 09:00:00 8 [IU] via SUBCUTANEOUS
  Administered 2021-07-26 – 2021-07-27 (×6): 5 [IU] via SUBCUTANEOUS
  Administered 2021-07-27: 3 [IU] via SUBCUTANEOUS
  Administered 2021-07-27 – 2021-07-28 (×2): 5 [IU] via SUBCUTANEOUS
  Administered 2021-07-28 (×2): 3 [IU] via SUBCUTANEOUS
  Administered 2021-07-28: 5 [IU] via SUBCUTANEOUS
  Administered 2021-07-28: 3 [IU] via SUBCUTANEOUS
  Administered 2021-07-28 – 2021-07-29 (×2): 5 [IU] via SUBCUTANEOUS

## 2021-07-18 MED ORDER — EPINEPHRINE PF 1 MG/ML IJ SOLN
INTRAMUSCULAR | Status: DC | PRN
  Administered 2021-07-18: 01:00:00 4 ug/min via INTRAVENOUS

## 2021-07-18 MED ORDER — PREDNISONE 5 MG PO TABS
5.0000 mg | ORAL_TABLET | Freq: Every day | ORAL | Status: DC
  Administered 2021-07-18 – 2021-07-31 (×14): 5 mg
  Filled 2021-07-18 (×14): qty 1

## 2021-07-18 MED ORDER — CHLORHEXIDINE GLUCONATE 0.12% ORAL RINSE (MEDLINE KIT)
15.0000 mL | Freq: Two times a day (BID) | OROMUCOSAL | Status: DC
  Administered 2021-07-18 – 2021-08-01 (×30): 15 mL via OROMUCOSAL

## 2021-07-18 MED ORDER — MIDAZOLAM HCL 2 MG/2ML IJ SOLN
INTRAMUSCULAR | Status: AC
  Filled 2021-07-18: qty 4

## 2021-07-18 MED FILL — Medication: Qty: 1 | Status: AC

## 2021-07-18 NOTE — Progress Notes (Signed)
Patient unavailable for EEG hookup.  Going to IR.  We will re-attempt.

## 2021-07-18 NOTE — Sedation Documentation (Signed)
Patient transported to 4 N ICU. RN staff at bedside. Bedside report given. Venous access left in right groin.

## 2021-07-18 NOTE — Progress Notes (Signed)
OT Cancellation Note  Patient Details Name: Jaime Whitaker MRN: [id number] DOB: 1940/09/18   Cancelled Treatment:    Reason Eval/Treat Not Completed: Patient not medically ready.  Active bedrest order Patient still has sheath from IR and received TNK ~0200 during CPR. Continue efforts as appropriate.    Kamera Dubas D Quincie Haroon 07/18/2021, 8:07 AM 07/18/2021  RP, OTR/L  Acute Rehabilitation Services  Office:  [phone]

## 2021-07-18 NOTE — Anesthesia Postprocedure Evaluation (Signed)
Anesthesia Post Note  Patient: Jaime Whitaker  Procedure(s) Performed: IR WITH ANESTHESIA (canceled)     Patient location during evaluation: ICU Anesthesia Type: General Level of consciousness: patient remains intubated per anesthesia plan Pain management: pain level controlled Vital Signs Assessment: post-procedure vital signs reviewed and stable Respiratory status: patient remains intubated per anesthesia plan Cardiovascular status: stable Postop Assessment: no apparent nausea or vomiting Anesthetic complications: no   No notable events documented.  Last Vitals:  Vitals:   07/18/21 1745 07/18/21 1800  BP: 125/72 (!) 137/98  Pulse: 75 (!) 102  Resp: (!) 28 14  Temp:    SpO2: 100% 100%    Last Pain:  Vitals:   07/18/21 0800  TempSrc: Axillary  PainSc:                  Caren Macadam

## 2021-07-18 NOTE — Progress Notes (Signed)
EEG complete - results pending 

## 2021-07-18 NOTE — Progress Notes (Signed)
PT Cancellation Note  Patient Details Name: Jaime Whitaker MRN: [id number] DOB: 11/08/1940   Cancelled Treatment:    Reason Eval/Treat Not Completed: Active bedrest order Patient still has sheath from IR and received TNK ~0200 during CPR. PT will evaluate once activity orders are updated.   Mohamad Bruso A. Dan Humphreys PT, DPT Acute Rehabilitation Services Pager [phone] Office [phone]    Viviann Spare 07/18/2021, 8:04 AM

## 2021-07-18 NOTE — Progress Notes (Signed)
Patient transported to 4N15 from IR via the ventilator with no complications.

## 2021-07-18 NOTE — Sedation Documentation (Signed)
CPR in progress. 

## 2021-07-18 NOTE — Progress Notes (Signed)
Referring Physician(s): Stroke and Critical care team   Supervising Physician: Jaime Whitaker, Jaime Whitaker  Patient Status:  [name]  Chief Complaint:  Code stroke, s/p revascularization of occluded Lt MCA achieving TICI 3 with Dr. Orpah Clintoneveshwar  Hospital course complicated by bilateral PE R>L, s/p thrombectomy from right PA. Patient became hemodynamically unstable, the procedure was aborted, patient was put on tPA. A venous and arterial sheaths were left in place.    Subjective:  Pt laying in bed, intubated and sedated.  Family members and RN at the bedside.  RN states the patient has not shown  much improvement in his neurological symptoms.   Allergies: Patient has no known allergies.  Medications: Prior to Admission medications   Medication Sig Start Date End Date Taking? Authorizing Provider  Bismuth 262 MG CHEW Chew 2 each by mouth every 6 (six) hours. Patient taking differently: Chew 2 each by mouth every 6 (six) hours as needed (heartburn). 06/17/21  Yes Jaime Whitaker, Daniel, MD  brimonidine (ALPHAGAN) 0.2 % ophthalmic solution Place 1 drop into both eyes daily. 05/28/21  Yes [provider]  furosemide (LASIX) 40 MG tablet Take 40 mg by mouth daily.   Yes [provider]  ibuprofen (ADVIL) 200 MG tablet Take 200-600 mg by mouth every 6 (six) hours as needed for moderate pain.   Yes [provider]  IFEREX 150 150 MG capsule Take 150 mg by mouth 2 (two) times daily. 06/23/21  Yes [provider]  latanoprost (XALATAN) 0.005 % ophthalmic solution Place 1 drop into both eyes at bedtime. 03/30/21  Yes [provider]  lovastatin (MEVACOR) 20 MG tablet Take 20 mg by mouth at bedtime.   Yes [provider]  metoprolol (LOPRESSOR) 50 MG tablet Take 50 mg by mouth daily.   Yes [provider]  Multiple Vitamins-Minerals (CENTRUM MEN PO) Take 1 tablet by mouth daily.   Yes [provider]  Multiple  Vitamins-Minerals (ICAPS AREDS 2 PO) Take 1 capsule by mouth 2 (two) times daily.   Yes [provider]  omeprazole (PRILOSEC) 40 MG capsule Take 1 capsule (40 mg total) by mouth 2 (two) times daily for 14 days. 06/14/21 November 13, 2020 Yes Jaime Whitaker, Daniel, MD  predniSONE (DELTASONE) 5 MG tablet Take 5 mg by mouth daily. 07/15/21  Yes [provider]  promethazine (PHENERGAN) 25 MG tablet Take 25 mg by mouth every 6 (six) hours as needed for nausea or vomiting.   Yes [provider]  ferrous sulfate 325 (65 FE) MG EC tablet Take 1 tablet (325 mg total) by mouth 2 (two) times daily. Patient not taking: Reported on 03-May-2021 06/03/21 09/01/21  Raquel Jamesarlan, Chelsea L, NP     Vital Signs: BP 125/78    Pulse (!) 129    Temp 99.6 F (37.6 C) (Axillary)    Resp (!) 28    Ht 5\' 9"  (1.753 m)    Wt 218 lb 0.6 oz (98.9 kg)    SpO2 100%    BMI 32.20 kg/m   Physical Exam Vitals reviewed.  Constitutional:      General: He is not in acute distress.    Appearance: He is ill-appearing.  HENT:     Head: Normocephalic and atraumatic.  Eyes:     Pupils: Pupils are equal, round, and reactive to light.     Comments: Pupils 2 mm, symmetric   Cardiovascular:     Comments: DP 1+ R>L  Pulmonary:     Comments: Intubated  Skin:    General: Skin is warm and dry.     Coloration: Skin is not jaundiced or pale.     Comments: Two sheaths on right groin. Old blood noted around the 12 fr CFV sheath. Skin around the sheath soft and clean.   Neurological:     Comments: Sedated  No movement in extremities     Imaging: CT HEAD WO CONTRAST (5MM)  Result Date: 07/18/2021 CLINICAL DATA:  80 year old male status post left MCA M1 occlusion, revascularization. EXAM: CT HEAD WITHOUT CONTRAST TECHNIQUE: Contiguous axial images were obtained from the base of the skull through the vertex without intravenous contrast. COMPARISON:  07/01/2021 and earlier. FINDINGS: Brain: No midline shift, mass effect,  or evidence of intracranial mass lesion. No ventriculomegaly. No acute intracranial hemorrhage identified. However, there is loss of gray-white matter differentiation in the posterior left hemisphere, most apparent on series 5, images 13 and 18. Posterior left MCA territory most affected. Deep gray matter nuclei relatively spared. Gray-white matter differentiation elsewhere appears within normal limits. Vascular: Calcified atherosclerosis at the skull base. Some residual intravascular contrast is suspected. Skull: No acute osseous abnormality identified. Sinuses/Orbits: Stable scattered paranasal sinus fluid and mucosal thickening in the setting of intubation. Tympanic cavities and mastoids remain clear. Other: No acute orbit or scalp soft tissue finding. Intubated. Oral enteric tube visible. IMPRESSION: 1. Early/evolving posterior Left MCA infarct. Probably moderate-sized infarct with subtle cytotoxic edema at this time. No associated hemorrhage or mass effect. 2. Elsewhere stable non contrast CT appearance of the brain. Electronically Signed   By: Genevie Ann M.D.   On: 07/18/2021 06:54   CT HEAD WO CONTRAST (5MM)  Result Date: 07/21/2021 CLINICAL DATA:  Stroke, follow up recent CVA s/p revascularization assess for hemorrhagic conversion. EXAM: CT HEAD WITHOUT CONTRAST TECHNIQUE: Contiguous axial images were obtained from the base of the skull through the vertex without intravenous contrast. COMPARISON:  None. FINDINGS: Brain: Normal anatomic configuration. Parenchymal volume loss is commensurate with the patient's age. Mild periventricular white matter changes are present likely reflecting the sequela of small vessel ischemia. Remote lacunar infarcts are noted within the thalami bilaterally. No abnormal intra or extra-axial mass lesion or fluid collection. No abnormal mass effect or midline shift. No evidence of acute intracranial hemorrhage or infarct. Ventricular size is normal. Cerebellum unremarkable.  Vascular: No asymmetric hyperdense vasculature at the skull base. Skull: Intact Sinuses/Orbits: Mucous retention cyst or polyp within the left maxillary sinus noted. Remaining paranasal sinuses are clear. Ocular lenses have been removed. Orbits are otherwise unremarkable. Other: Mastoid air cells and middle ear cavities are clear. IMPRESSION: No acute intracranial hemorrhage or infarct. Stable remote lacunar infarcts within the thalami bilaterally. Stable senescent change. Electronically Signed   By: Fidela Salisbury M.D.   On: 07/14/2021 22:04   CT Angio Chest Pulmonary Embolism (PE) W or WO Contrast  Result Date: 07/13/2021 CLINICAL DATA:  Hypoxia, recent deep vein thrombosis EXAM: CT ANGIOGRAPHY CHEST WITH CONTRAST TECHNIQUE: Multidetector CT imaging of the chest was performed using the standard protocol during bolus administration of intravenous contrast. Multiplanar CT image reconstructions and MIPs were obtained to evaluate the vascular anatomy. CONTRAST:  63mL OMNIPAQUE IOHEXOL 350 MG/ML SOLN COMPARISON:  11/10/2020 CT chest FINDINGS: Cardiovascular: Large right pulmonary artery saddle embolus extending substantially into the right upper lobe, right middle lobe, and right lower lobe pulmonary arteries, high level of occlusion. Left pulmonary artery saddle embolus extending into the upper lobe and lower lobe pulmonary arteries. Clot burden is heavy.  Dilated right ventricle with right ventricular to left ventricular ratio of 1.47. Mild generalized cardiomegaly is also present. Coronary, aortic arch, and branch vessel atherosclerotic vascular disease. Ascending thoracic aorta 4.0 cm in diameter compatible with mild aneurysmal dilatation. Mediastinum/Nodes: Left thyroid nodules including a solid-appearing 2.4 cm left thyroid nodule on image 9 of series 6, this was previously biopsied on 08/12/2019. This has been evaluated on previous imaging. (ref: J Am Coll Radiol. 2015 Feb;12(2): 143-50).A nasogastric tube  extends into the stomach. An endotracheal tube is present with tip 6.3 cm above the carina. Lungs/Pleura: Mild peribronchovascular atelectasis in the right lower lobe. Atelectasis posteriorly in the left upper lobe with complete atelectasis of the left lower lobe. There is some bandlike atelectasis peripherally in the left upper lobe as well. No significant pleural effusion. Upper Abdomen: The nasogastric tube terminates in the antrum. Possible gallbladder wall thickening although this could be from gallbladder contraction. Musculoskeletal: Prior lower thoracic vertebral augmentations. Thoracic spondylosis. Review of the MIP images confirms the above findings. IMPRESSION: 1. Very large acute bilateral pulmonary emboli, with near complete occlusion of the right pulmonary artery and left pulmonary artery saddle emboli extending substantially in the left upper lobe and left lower lobe. Clot burden is heavy. Cardiomegaly especially the right heart indicating right heart strain. 2. Ascending thoracic aorta measures 4.0 cm in diameter, compatible with aneurysm. Recommend annual imaging followup by CTA or MRA. This recommendation follows 2010 ACCF/AHA/AATS/ACR/ASA/SCA/SCAI/SIR/STS/SVM Guidelines for the Diagnosis and Management of Patients with Thoracic Aortic Disease. Circulation. 2010; 121ML:4928372. Aortic aneurysm NOS (ICD10-I71.9) 3. Complete atelectasis of the left lower lobe. Mild scattered atelectasis in the left upper lobe and right lower lobe. 4. Gallbladder wall thickening although this could be from gallbladder contraction rather than necessarily indicating inflammation. Critical Value/emergent results were called by telephone at the time of interpretation on 07/16/2021 at 6:35 pm to provider [facility] , who verbally acknowledged these results. Electronically Signed   By: Van Clines M.D.   On: 06/27/2021 18:42   IR Angiogram Pulmonary Bilateral Selective  Result Date: 07/18/2021 INDICATION:  80 year old male with at least intermediate high risk pulmonary embolism in the setting of known lower extremity DVT and cessation correlation. Status post left middle cerebral artery embolization earlier the same day. EXAM: 1. Ultrasound-guided vascular access of the right common femoral vein. 2. Pulmonary angiography. 3. Pulmonary manometry. 4. Aspiration thrombectomy of the bilateral pulmonary arteries. 5. Ultrasound-guided vascular access of the right common femoral artery. COMPARISON:  CTAnmot  chest from 07/09/2021 MEDICATIONS: None. ANESTHESIA/SEDATION: General anesthesia. FLUOROSCOPY TIME:  Fluoroscopy Time: 40 minutes 0 seconds (381.7 mGy). COMPLICATIONS: SIR LEVEL D - Requires major therapy, prolonged hospitalization (>48 hours). After bilateral aspiration, the patient experienced bradycardia and profound hypoxia lead cardiac arrest. ACLS was run in code was called with ROSC x2. Systemic tPA was administered. The patient is stabilized well enough to return to the ICU. TECHNIQUE: Informed written consent was obtained from the patient after a thorough discussion of the procedural risks, benefits and alternatives. All questions were addressed. Maximal Sterile Barrier Technique was utilized including caps, mask, sterile gowns, sterile gloves, sterile drape, hand hygiene and skin antiseptic. A timeout was performed prior to the initiation of the procedure. The right groin was prepped and draped in standard fashion. Preprocedure ultrasound evaluation demonstrated patency of the right common femoral vein. The procedure was planned. A small skin nick was made. Under direct ultrasound visualization, the right common femoral vein was accessed with a 21 gauge micropuncture needle.  A permanent image was captured and stored in the record. A micropuncture sheath was introduced through which a Wholey wire was inserted into the inferior vena cava. An 8 [place] vascular sheath was then placed in preparation for Perclose  suture deployment x2. Serial dilation was then performed and a 12 French, 45 cm dry seal sheath was placed and the tip was positioned in the perihepatic inferior vena cava. Through the indwelling sheath, an angled 5 French pigtail catheter was inserted and directed into the main pulmonary artery. Pulmonary manometry was performed in pressures were measured as 58/22 (mean 35) mm Hg compatible with pulmonary hypertension. Pulmonary angiogram was then performed which demonstrated distal right main pulmonary artery large pulmonary embolism resulting in severely diminished flow to the right upper lobe and small left distal main pulmonary pulmonary embolism resulting in diminished flow to the left lower lobe. The pigtail catheter was exchanged over a short taper stiff Amplatz wire for a 5 [place] Select catheter which was then directed to the left main pulmonary artery, the catheter was then removed and inserted coaxially through a Lightning 12 Penumbra aspiration catheter which was then directed into the left main pulmonary artery. The 5 French catheter was removed and aspiration thrombectomy was then performed over the Amplatz wire into the proximal left main pulmonary artery. There is minimal aspirate in the aspiration canister. The catheter was retracted into the proximal main left pulmonary artery and left pulmonary angiogram was then performed which demonstrated improved patency and perfusion throughout the left lung with probable persistent small thrombus burden in the left inferior lobar branch, nonocclusive. The right main pulmonary artery was then attempted to be selected. The 12 French aspiration catheter was unable to select the right pulmonary artery independently over the Amplatz wire. Therefore the aspiration catheter was removed and with multiple attempts, access to the right pulmonary artery was lost with several combinations of catheters and wires including short taper stiff Amplatz, Wholey wire, 5 French  pigtail catheter common 5 [place] select catheter. The right inferior main pulmonary artery was then selected with the short taper stiff Amplatz Glidewire and again in coaxial fashion the 5 [place] select catheter was placed through the 12 L and successfully positioned in the right main pulmonary artery. Aspiration thrombectomy was performed yielded small clot burden. Minimal manipulation of the aspiration catheter resulted in loss of access of the right main pulmonary artery due to significant tortuosity of the right heart and pulmonary artery vasculature. The patient then experience bradycardia and worsening hypoxia. The pigtail catheter was repositioned in the main pulmonary artery and pulmonary angiogram was repeated. There is persistent small volume thrombus burden in the distal left main pulmonary artery and persistent moderate but improved thrombus burden in the distal right main pulmonary artery extending into the upper and middle lobar branches. Pulmonary manometry was again performed which with a mean main pulmonary artery pressure of 40 mm Hg. At this point, the patient continued to decompensate from hemodynamic standpoint leading to cardiac arrest. The catheters were removed from the right heart. Chest compressions were were initiated immediately and ACLS algorithm was run in concert with Anesthesia and the hospital code team. A right common femoral artery sheath was then placed under ultrasound guidance for arterial blood pressure monitoring in this acute setting as per request by anesthesia. After return of spontaneous circulation, discussion with the ICU and pharmacy teams was had and decision to administer systemic tPA as the etiology of decompensation was highly suspected to be related to  thrombus burden/pulmonary embolism. The patient stabilized well enough to be transferred back to the ICU. Sterile bandages were applied over the right groin site. The 70 French vascular sheath remain in the right  groin. The patient's status and updates were delivered directly to the family who was then ulcer to the bedside to spend time with the patient. FINDINGS: Acute bilateral pulmonary embolism, right greater than left. Marginally successful aspiration thrombectomy without large volume thrombus retrieval. No significant prove mint in pulmonary artery pressure after aspiration thrombectomy. The patient unexpectedly experienced cardiopulmonary decompensation requiring cardiopulmonary resuscitation. IMPRESSION: 1. Acute bilateral pulmonary emboli with pulmonary artery hypertension. 2. Marginally successful aspiration thrombectomy of the bilateral pulmonary arteries small volume thrombus yield. 3. The procedure was terminated due to acute, life-threatening cardiopulmonary decompensation in the patient was transferred back to the ICU in critical condition. PLAN: Pending clinical outcome, the right groin sheaths may be removed by Interventional Radiology upon request. Ruthann Cancer, MD [facility] Radiology Electronically Signed   By: Ruthann Cancer M.D.   On: 07/18/2021 10:00   IR THROMBECT PRIM MECH INIT (INCLU) MOD SED  Result Date: 07/18/2021 INDICATION: 80 year old male with at least intermediate high risk pulmonary embolism in the setting of known lower extremity DVT and cessation correlation. Status post left middle cerebral artery embolization earlier the same day. EXAM: 1. Ultrasound-guided vascular access of the right common femoral vein. 2. Pulmonary angiography. 3. Pulmonary manometry. 4. Aspiration thrombectomy of the bilateral pulmonary arteries. 5. Ultrasound-guided vascular access of the right common femoral artery. COMPARISON:  CTAnmot  chest from 07/01/2021 MEDICATIONS: None. ANESTHESIA/SEDATION: General anesthesia. FLUOROSCOPY TIME:  Fluoroscopy Time: 40 minutes 0 seconds (381.7 mGy). COMPLICATIONS: SIR LEVEL D - Requires major therapy, prolonged  hospitalization (>48 hours). After bilateral aspiration, the patient experienced bradycardia and profound hypoxia lead cardiac arrest. ACLS was run in code was called with ROSC x2. Systemic tPA was administered. The patient is stabilized well enough to return to the ICU. TECHNIQUE: Informed written consent was obtained from the patient after a thorough discussion of the procedural risks, benefits and alternatives. All questions were addressed. Maximal Sterile Barrier Technique was utilized including caps, mask, sterile gowns, sterile gloves, sterile drape, hand hygiene and skin antiseptic. A timeout was performed prior to the initiation of the procedure. The right groin was prepped and draped in standard fashion. Preprocedure ultrasound evaluation demonstrated patency of the right common femoral vein. The procedure was planned. A small skin nick was made. Under direct ultrasound visualization, the right common femoral vein was accessed with a 21 gauge micropuncture needle. A permanent image was captured and stored in the record. A micropuncture sheath was introduced through which a Wholey wire was inserted into the inferior vena cava. An 8 [place] vascular sheath was then placed in preparation for Perclose suture deployment x2. Serial dilation was then performed and a 12 French, 45 cm dry seal sheath was placed and the tip was positioned in the perihepatic inferior vena cava. Through the indwelling sheath, an angled 5 French pigtail catheter was inserted and directed into the main pulmonary artery. Pulmonary manometry was performed in pressures were measured as 58/22 (mean 35) mm Hg compatible with pulmonary hypertension. Pulmonary angiogram was then performed which demonstrated distal right main pulmonary artery large pulmonary embolism resulting in severely diminished flow to the right upper lobe and small left distal main pulmonary pulmonary embolism resulting in diminished flow to the left lower lobe. The pigtail  catheter was exchanged over a short  taper stiff Amplatz wire for a 5 [place] Select catheter which was then directed to the left main pulmonary artery, the catheter was then removed and inserted coaxially through a Lightning 12 Penumbra aspiration catheter which was then directed into the left main pulmonary artery. The 5 French catheter was removed and aspiration thrombectomy was then performed over the Amplatz wire into the proximal left main pulmonary artery. There is minimal aspirate in the aspiration canister. The catheter was retracted into the proximal main left pulmonary artery and left pulmonary angiogram was then performed which demonstrated improved patency and perfusion throughout the left lung with probable persistent small thrombus burden in the left inferior lobar branch, nonocclusive. The right main pulmonary artery was then attempted to be selected. The 12 French aspiration catheter was unable to select the right pulmonary artery independently over the Amplatz wire. Therefore the aspiration catheter was removed and with multiple attempts, access to the right pulmonary artery was lost with several combinations of catheters and wires including short taper stiff Amplatz, Wholey wire, 5 French pigtail catheter common 5 [place] select catheter. The right inferior main pulmonary artery was then selected with the short taper stiff Amplatz Glidewire and again in coaxial fashion the 5 [place] select catheter was placed through the 12 L and successfully positioned in the right main pulmonary artery. Aspiration thrombectomy was performed yielded small clot burden. Minimal manipulation of the aspiration catheter resulted in loss of access of the right main pulmonary artery due to significant tortuosity of the right heart and pulmonary artery vasculature. The patient then experience bradycardia and worsening hypoxia. The pigtail catheter was repositioned in the main pulmonary artery and pulmonary angiogram was  repeated. There is persistent small volume thrombus burden in the distal left main pulmonary artery and persistent moderate but improved thrombus burden in the distal right main pulmonary artery extending into the upper and middle lobar branches. Pulmonary manometry was again performed which with a mean main pulmonary artery pressure of 40 mm Hg. At this point, the patient continued to decompensate from hemodynamic standpoint leading to cardiac arrest. The catheters were removed from the right heart. Chest compressions were were initiated immediately and ACLS algorithm was run in concert with Anesthesia and the hospital code team. A right common femoral artery sheath was then placed under ultrasound guidance for arterial blood pressure monitoring in this acute setting as per request by anesthesia. After return of spontaneous circulation, discussion with the ICU and pharmacy teams was had and decision to administer systemic tPA as the etiology of decompensation was highly suspected to be related to thrombus burden/pulmonary embolism. The patient stabilized well enough to be transferred back to the ICU. Sterile bandages were applied over the right groin site. The 2 French vascular sheath remain in the right groin. The patient's status and updates were delivered directly to the family who was then ulcer to the bedside to spend time with the patient. FINDINGS: Acute bilateral pulmonary embolism, right greater than left. Marginally successful aspiration thrombectomy without large volume thrombus retrieval. No significant prove mint in pulmonary artery pressure after aspiration thrombectomy. The patient unexpectedly experienced cardiopulmonary decompensation requiring cardiopulmonary resuscitation. IMPRESSION: 1. Acute bilateral pulmonary emboli with pulmonary artery hypertension. 2. Marginally successful aspiration thrombectomy of the bilateral pulmonary arteries small volume thrombus yield. 3. The procedure was  terminated due to acute, life-threatening cardiopulmonary decompensation in the patient was transferred back to the ICU in critical condition. PLAN: Pending clinical outcome, the right groin sheaths may be removed by Interventional  Radiology upon request. Marliss Coots, MD [facility] Radiology Electronically Signed   By: Marliss Coots M.D.   On: 07/18/2021 10:00   IR US Guide Vasc Access Right  Result Date: 07/18/2021 INDICATION: 80 year old male with at least intermediate high risk pulmonary embolism in the setting of known lower extremity DVT and cessation correlation. Status post left middle cerebral artery embolization earlier the same day. EXAM: 1. Ultrasound-guided vascular access of the right common femoral vein. 2. Pulmonary angiography. 3. Pulmonary manometry. 4. Aspiration thrombectomy of the bilateral pulmonary arteries. 5. Ultrasound-guided vascular access of the right common femoral artery. COMPARISON:  CTAnmot  chest from 07/02/2021 MEDICATIONS: None. ANESTHESIA/SEDATION: General anesthesia. FLUOROSCOPY TIME:  Fluoroscopy Time: 40 minutes 0 seconds (381.7 mGy). COMPLICATIONS: SIR LEVEL D - Requires major therapy, prolonged hospitalization (>48 hours). After bilateral aspiration, the patient experienced bradycardia and profound hypoxia lead cardiac arrest. ACLS was run in code was called with ROSC x2. Systemic tPA was administered. The patient is stabilized well enough to return to the ICU. TECHNIQUE: Informed written consent was obtained from the patient after a thorough discussion of the procedural risks, benefits and alternatives. All questions were addressed. Maximal Sterile Barrier Technique was utilized including caps, mask, sterile gowns, sterile gloves, sterile drape, hand hygiene and skin antiseptic. A timeout was performed prior to the initiation of the procedure. The right groin was prepped and draped in standard fashion. Preprocedure  ultrasound evaluation demonstrated patency of the right common femoral vein. The procedure was planned. A small skin nick was made. Under direct ultrasound visualization, the right common femoral vein was accessed with a 21 gauge micropuncture needle. A permanent image was captured and stored in the record. A micropuncture sheath was introduced through which a Wholey wire was inserted into the inferior vena cava. An 8 [place] vascular sheath was then placed in preparation for Perclose suture deployment x2. Serial dilation was then performed and a 12 French, 45 cm dry seal sheath was placed and the tip was positioned in the perihepatic inferior vena cava. Through the indwelling sheath, an angled 5 French pigtail catheter was inserted and directed into the main pulmonary artery. Pulmonary manometry was performed in pressures were measured as 58/22 (mean 35) mm Hg compatible with pulmonary hypertension. Pulmonary angiogram was then performed which demonstrated distal right main pulmonary artery large pulmonary embolism resulting in severely diminished flow to the right upper lobe and small left distal main pulmonary pulmonary embolism resulting in diminished flow to the left lower lobe. The pigtail catheter was exchanged over a short taper stiff Amplatz wire for a 5 [place] Select catheter which was then directed to the left main pulmonary artery, the catheter was then removed and inserted coaxially through a Lightning 12 Penumbra aspiration catheter which was then directed into the left main pulmonary artery. The 5 French catheter was removed and aspiration thrombectomy was then performed over the Amplatz wire into the proximal left main pulmonary artery. There is minimal aspirate in the aspiration canister. The catheter was retracted into the proximal main left pulmonary artery and left pulmonary angiogram was then performed which demonstrated improved patency and perfusion throughout the left lung with probable  persistent small thrombus burden in the left inferior lobar branch, nonocclusive. The right main pulmonary artery was then attempted to be selected. The 12 French aspiration catheter was unable to select the right pulmonary artery independently over the Amplatz wire. Therefore the aspiration catheter was removed and with multiple attempts, access to the  right pulmonary artery was lost with several combinations of catheters and wires including short taper stiff Amplatz, Wholey wire, 5 French pigtail catheter common 5 [place] select catheter. The right inferior main pulmonary artery was then selected with the short taper stiff Amplatz Glidewire and again in coaxial fashion the 5 [place] select catheter was placed through the 12 L and successfully positioned in the right main pulmonary artery. Aspiration thrombectomy was performed yielded small clot burden. Minimal manipulation of the aspiration catheter resulted in loss of access of the right main pulmonary artery due to significant tortuosity of the right heart and pulmonary artery vasculature. The patient then experience bradycardia and worsening hypoxia. The pigtail catheter was repositioned in the main pulmonary artery and pulmonary angiogram was repeated. There is persistent small volume thrombus burden in the distal left main pulmonary artery and persistent moderate but improved thrombus burden in the distal right main pulmonary artery extending into the upper and middle lobar branches. Pulmonary manometry was again performed which with a mean main pulmonary artery pressure of 40 mm Hg. At this point, the patient continued to decompensate from hemodynamic standpoint leading to cardiac arrest. The catheters were removed from the right heart. Chest compressions were were initiated immediately and ACLS algorithm was run in concert with Anesthesia and the hospital code team. A right common femoral artery sheath was then placed under ultrasound guidance for arterial  blood pressure monitoring in this acute setting as per request by anesthesia. After return of spontaneous circulation, discussion with the ICU and pharmacy teams was had and decision to administer systemic tPA as the etiology of decompensation was highly suspected to be related to thrombus burden/pulmonary embolism. The patient stabilized well enough to be transferred back to the ICU. Sterile bandages were applied over the right groin site. The 35 French vascular sheath remain in the right groin. The patient's status and updates were delivered directly to the family who was then ulcer to the bedside to spend time with the patient. FINDINGS: Acute bilateral pulmonary embolism, right greater than left. Marginally successful aspiration thrombectomy without large volume thrombus retrieval. No significant prove mint in pulmonary artery pressure after aspiration thrombectomy. The patient unexpectedly experienced cardiopulmonary decompensation requiring cardiopulmonary resuscitation. IMPRESSION: 1. Acute bilateral pulmonary emboli with pulmonary artery hypertension. 2. Marginally successful aspiration thrombectomy of the bilateral pulmonary arteries small volume thrombus yield. 3. The procedure was terminated due to acute, life-threatening cardiopulmonary decompensation in the patient was transferred back to the ICU in critical condition. PLAN: Pending clinical outcome, the right groin sheaths may be removed by Interventional Radiology upon request. Ruthann Cancer, MD [facility] Radiology Electronically Signed   By: Ruthann Cancer M.D.   On: 07/18/2021 10:00   IR US Guide Vasc Access Right  Result Date: 07/18/2021 INDICATION: 80 year old male with at least intermediate high risk pulmonary embolism in the setting of known lower extremity DVT and cessation correlation. Status post left middle cerebral artery embolization earlier the same day. EXAM: 1. Ultrasound-guided  vascular access of the right common femoral vein. 2. Pulmonary angiography. 3. Pulmonary manometry. 4. Aspiration thrombectomy of the bilateral pulmonary arteries. 5. Ultrasound-guided vascular access of the right common femoral artery. COMPARISON:  CTAnmot  chest from 07/12/2021 MEDICATIONS: None. ANESTHESIA/SEDATION: General anesthesia. FLUOROSCOPY TIME:  Fluoroscopy Time: 40 minutes 0 seconds (381.7 mGy). COMPLICATIONS: SIR LEVEL D - Requires major therapy, prolonged hospitalization (>48 hours). After bilateral aspiration, the patient experienced bradycardia and profound hypoxia lead cardiac arrest. ACLS was run in  code was called with ROSC x2. Systemic tPA was administered. The patient is stabilized well enough to return to the ICU. TECHNIQUE: Informed written consent was obtained from the patient after a thorough discussion of the procedural risks, benefits and alternatives. All questions were addressed. Maximal Sterile Barrier Technique was utilized including caps, mask, sterile gowns, sterile gloves, sterile drape, hand hygiene and skin antiseptic. A timeout was performed prior to the initiation of the procedure. The right groin was prepped and draped in standard fashion. Preprocedure ultrasound evaluation demonstrated patency of the right common femoral vein. The procedure was planned. A small skin nick was made. Under direct ultrasound visualization, the right common femoral vein was accessed with a 21 gauge micropuncture needle. A permanent image was captured and stored in the record. A micropuncture sheath was introduced through which a Wholey wire was inserted into the inferior vena cava. An 8 [place] vascular sheath was then placed in preparation for Perclose suture deployment x2. Serial dilation was then performed and a 12 French, 45 cm dry seal sheath was placed and the tip was positioned in the perihepatic inferior vena cava. Through the indwelling sheath, an angled 5 French pigtail catheter was  inserted and directed into the main pulmonary artery. Pulmonary manometry was performed in pressures were measured as 58/22 (mean 35) mm Hg compatible with pulmonary hypertension. Pulmonary angiogram was then performed which demonstrated distal right main pulmonary artery large pulmonary embolism resulting in severely diminished flow to the right upper lobe and small left distal main pulmonary pulmonary embolism resulting in diminished flow to the left lower lobe. The pigtail catheter was exchanged over a short taper stiff Amplatz wire for a 5 [place] Select catheter which was then directed to the left main pulmonary artery, the catheter was then removed and inserted coaxially through a Lightning 12 Penumbra aspiration catheter which was then directed into the left main pulmonary artery. The 5 French catheter was removed and aspiration thrombectomy was then performed over the Amplatz wire into the proximal left main pulmonary artery. There is minimal aspirate in the aspiration canister. The catheter was retracted into the proximal main left pulmonary artery and left pulmonary angiogram was then performed which demonstrated improved patency and perfusion throughout the left lung with probable persistent small thrombus burden in the left inferior lobar branch, nonocclusive. The right main pulmonary artery was then attempted to be selected. The 12 French aspiration catheter was unable to select the right pulmonary artery independently over the Amplatz wire. Therefore the aspiration catheter was removed and with multiple attempts, access to the right pulmonary artery was lost with several combinations of catheters and wires including short taper stiff Amplatz, Wholey wire, 5 French pigtail catheter common 5 [place] select catheter. The right inferior main pulmonary artery was then selected with the short taper stiff Amplatz Glidewire and again in coaxial fashion the 5 [place] select catheter was placed through the 12 L and  successfully positioned in the right main pulmonary artery. Aspiration thrombectomy was performed yielded small clot burden. Minimal manipulation of the aspiration catheter resulted in loss of access of the right main pulmonary artery due to significant tortuosity of the right heart and pulmonary artery vasculature. The patient then experience bradycardia and worsening hypoxia. The pigtail catheter was repositioned in the main pulmonary artery and pulmonary angiogram was repeated. There is persistent small volume thrombus burden in the distal left main pulmonary artery and persistent moderate but improved thrombus burden in the distal right main pulmonary artery extending into  the upper and middle lobar branches. Pulmonary manometry was again performed which with a mean main pulmonary artery pressure of 40 mm Hg. At this point, the patient continued to decompensate from hemodynamic standpoint leading to cardiac arrest. The catheters were removed from the right heart. Chest compressions were were initiated immediately and ACLS algorithm was run in concert with Anesthesia and the hospital code team. A right common femoral artery sheath was then placed under ultrasound guidance for arterial blood pressure monitoring in this acute setting as per request by anesthesia. After return of spontaneous circulation, discussion with the ICU and pharmacy teams was had and decision to administer systemic tPA as the etiology of decompensation was highly suspected to be related to thrombus burden/pulmonary embolism. The patient stabilized well enough to be transferred back to the ICU. Sterile bandages were applied over the right groin site. The 48 French vascular sheath remain in the right groin. The patient's status and updates were delivered directly to the family who was then ulcer to the bedside to spend time with the patient. FINDINGS: Acute bilateral pulmonary embolism, right greater than left. Marginally successful aspiration  thrombectomy without large volume thrombus retrieval. No significant prove mint in pulmonary artery pressure after aspiration thrombectomy. The patient unexpectedly experienced cardiopulmonary decompensation requiring cardiopulmonary resuscitation. IMPRESSION: 1. Acute bilateral pulmonary emboli with pulmonary artery hypertension. 2. Marginally successful aspiration thrombectomy of the bilateral pulmonary arteries small volume thrombus yield. 3. The procedure was terminated due to acute, life-threatening cardiopulmonary decompensation in the patient was transferred back to the ICU in critical condition. PLAN: Pending clinical outcome, the right groin sheaths may be removed by Interventional Radiology upon request. Marliss Coots, MD [facility] Radiology Electronically Signed   By: Marliss Coots M.D.   On: 07/18/2021 10:00   DG CHEST PORT 1 VIEW  Result Date: 07/18/2021 CLINICAL DATA:  Encounter for intubation EXAM: PORTABLE CHEST 1 VIEW COMPARISON:  Radiograph performed earlier on the same date FINDINGS: Interval adjustment of the endotracheal tube now distal tip approximately 5 cm above the carina. Feeding tube coursing below the diaphragm with distal tip not included. Cardiomediastinal silhouette is unchanged. Low lung volumes with left basilar atelectasis, unchanged. IMPRESSION: Interval repositioning of the endotracheal tube now in satisfactory position. No other significant interval change. Electronically Signed   By: Larose Hires D.O.   On: 07/18/2021 11:06   DG CHEST PORT 1 VIEW  Result Date: 07/18/2021 CLINICAL DATA:  Endotracheal tube present EXAM: PORTABLE CHEST 1 VIEW COMPARISON:  CT chest dated July 31, 2021. FINDINGS: The heart is normal in size. Low lung volumes with left basilar atelectasis. There is also atelectasis of the medial aspect of the right lower lobe. Endotracheal tube with distal tip approximately 11.7 cm above the carina.  IMPRESSION: 1. Endotracheal tube with distal tip approximately 11 cm above the carina, it need to be advanced about 5 cm. 2. Low lung volumes with bibasilar atelectasis, left greater than the right. Electronically Signed   By: Larose Hires D.O.   On: 07/18/2021 08:51   DG CHEST PORT 1 VIEW  Result Date: 07-31-2021 CLINICAL DATA:  Intubation EXAM: PORTABLE CHEST 1 VIEW COMPARISON:  Portable exam 1620 hours compared to 06/26/2021 FINDINGS: Tip of endotracheal tube projects 4.6 cm above carina. Nasogastric tube extends into stomach. Normal heart size, mediastinal contours, and pulmonary vascularity. Atelectasis of LEFT lower lobe. Subsegmental atelectasis RIGHT base. Upper lungs clear. No definite pleural effusion or pneumothorax. IMPRESSION: Bibasilar atelectasis greater on LEFT.  Electronically Signed   By: Lavonia Dana M.D.   On: 07/18/2021 16:32   ECHOCARDIOGRAM COMPLETE BUBBLE STUDY  Result Date: 07/18/2021    ECHOCARDIOGRAM REPORT   Patient Name:   Jaime Whitaker Date of Exam: 07/18/2021 Medical Rec #:  [id number]      Height:       69.0 in Accession #:    [id number]     Weight:       218.0 lb Date of Birth:  [birth year]     BSA:          2.143 m Patient Age:    80 years       BP:           138/65 mmHg Patient Gender: M              HR:           120 bpm. Exam Location:  Inpatient Procedure: 2D Echo, Cardiac Doppler, Color Doppler and Intracardiac            Opacification Agent Indications:    Stroke  History:        Patient has no prior history of Echocardiogram examinations.                 Arrythmias:Tachycardia. DVT with PE. Respiratory failure                 requiring mechanical ventilation. Cardiac arrest.  Sonographer:    Merrie Roof RDCS Referring Phys: [id number] [facility] MEIER  Sonographer Comments: Technically difficult study due to poor echo windows, suboptimal parasternal window, suboptimal apical window, suboptimal subcostal window and echo performed with patient supine and on artificial  respirator. The bubble was not done due to poor image quality that would lead to nondiagnostic bubble study. IMPRESSIONS  1. Left ventricular ejection fraction, by estimation, is 60 to 65%. The left ventricle has normal function. The left ventricle has no regional wall motion abnormalities. There is moderate concentric left ventricular hypertrophy. Left ventricular diastolic function could not be evaluated.  2. Right ventricular systolic function is moderately reduced. The right ventricular size is severely enlarged.  3. The mitral valve is grossly normal. Trivial mitral valve regurgitation. No evidence of mitral stenosis.  4. The aortic valve is grossly normal. There is mild calcification of the aortic valve. There is mild thickening of the aortic valve. Aortic valve regurgitation is trivial.  5. Aortic dilatation noted. There is mild dilatation of the ascending aorta, measuring 40 mm. Comparison(s): No prior Echocardiogram. Conclusion(s)/Recommendation(s): RV is dilated with reduced function, consistent with history of bilateral PE. FINDINGS  Left Ventricle: Left ventricular ejection fraction, by estimation, is 60 to 65%. The left ventricle has normal function. The left ventricle has no regional wall motion abnormalities. Definity contrast agent was given IV to delineate the left ventricular  endocardial borders. The left ventricular internal cavity size was small. There is moderate concentric left ventricular hypertrophy. Left ventricular diastolic function could not be evaluated. Right Ventricle: The right ventricular size is severely enlarged. Right vetricular wall thickness was not well visualized. Right ventricular systolic function is moderately reduced. Left Atrium: Left atrial size was not well visualized. Right Atrium: Right atrial size was not well visualized. Pericardium: There is no evidence of pericardial effusion. Presence of epicardial fat layer. Mitral Valve: The mitral valve is grossly normal.  Trivial mitral valve regurgitation. No evidence of mitral valve stenosis. Tricuspid Valve: The tricuspid valve is grossly normal. Tricuspid valve regurgitation is trivial.  No evidence of tricuspid stenosis. Aortic Valve: The aortic valve is grossly normal. There is mild calcification of the aortic valve. There is mild thickening of the aortic valve. Aortic valve regurgitation is trivial. Pulmonic Valve: The pulmonic valve was not well visualized. Pulmonic valve regurgitation is mild. No evidence of pulmonic stenosis. Aorta: Aortic dilatation noted. There is mild dilatation of the ascending aorta, measuring 40 mm. Venous: IVC assessment for right atrial pressure unable to be performed due to mechanical ventilation. IAS/Shunts: The interatrial septum was not well visualized.  LEFT VENTRICLE PLAX 2D LVIDd:         3.80 cm LVIDs:         2.60 cm LV PW:         1.40 cm LV IVS:        1.50 cm LVOT diam:     2.00 cm LVOT Area:     3.14 cm  RIGHT VENTRICLE RV Basal diam:  4.80 cm RV Mid diam:    5.10 cm LEFT ATRIUM         Index LA diam:    3.60 cm 1.68 cm/m   AORTA Ao Root diam: 3.80 cm Ao Asc diam:  4.00 cm TRICUSPID VALVE TR Peak grad:   19.4 mmHg TR Vmax:        220.00 cm/s  SHUNTS Systemic Diam: 2.00 cm Buford Dresser MD Electronically signed by Buford Dresser MD Signature Date/Time: 07/18/2021/1:12:06 PM    Final    CT HEAD CODE STROKE WO CONTRAST  Result Date: 06/29/2021 CLINICAL DATA:  Code stroke. Neuro deficit, acute, stroke suspected. Right facial droop. EXAM: CT HEAD WITHOUT CONTRAST TECHNIQUE: Contiguous axial images were obtained from the base of the skull through the vertex without intravenous contrast. COMPARISON:  06/26/2021 FINDINGS: Brain: Atrophy. Chronic small-vessel ischemic changes of the white matter. 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 No parenchymal finding of acute infarction, mass lesion, hemorrhage, hydrocephalus or extra-axial collection. Vascular: Hyperdense left middle cerebral  artery. Skull: Normal Sinuses/Orbits: Clear/normal Other: None ASPECTS (Dale Stroke Program Early CT Score) - Ganglionic level infarction (caudate, lentiform nuclei, internal capsule, insula, M1-M3 cortex): 7 - Supraganglionic infarction (M4-M6 cortex): 3 Total score (0-10 with 10 being normal): 10 IMPRESSION: 1. Hyperdense left middle cerebral artery worrisome for acute embolic occlusion. No parenchymal finding of stroke at this time. 2. Chronic small-vessel ischemic changes of the white matter. 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 3. ASPECTS is 10. 4. These results were communicated to Dr. Quinn Axe at 11:36 am on 07/09/2021 by text page via the [facility] messaging system. Electronically Signed   By: Nelson Chimes M.D.   On: 07/20/2021 11:37   CT ANGIO HEAD NECK W WO CM W PERF (CODE STROKE)  Result Date: 07/27/2021 CLINICAL DATA:  Neuro deficit, acute, stroke suspected. Additional history provided: Right-sided facial droop, right-sided flaccid. EXAM: CT ANGIOGRAPHY HEAD AND NECK CT PERFUSION BRAIN TECHNIQUE: Multidetector CT imaging of the head and neck was performed using the standard protocol during bolus administration of intravenous contrast. Multiplanar CT image reconstructions and MIPs were obtained to evaluate the vascular anatomy. Carotid stenosis measurements (when applicable) are obtained utilizing NASCET criteria, using the distal internal carotid diameter as the denominator. Multiphase CT imaging of the brain was performed following IV bolus contrast injection. Subsequent parametric perfusion maps were calculated using RAPID software. CONTRAST:  149mL OMNIPAQUE IOHEXOL 350 MG/ML SOLN COMPARISON:  Noncontrast head CT performed earlier today 07/09/2021. Thyroid ultrasound 07/11/2019. Report from ultrasound-guided thyroid FNA 08/12/2019. FINDINGS: CTA NECK FINDINGS Aortic arch: Standard aortic branching.  Atherosclerotic plaque within the visualized aortic arch and proximal major branch vessels of the neck. No  hemodynamically significant innominate or proximal subclavian artery stenosis. Right carotid system: CCA and ICA patent within the neck without stenosis. Minimal soft plaque within the CCA and about the carotid bifurcation. Left carotid system: CCA and ICA patent within the neck without stenosis. Minimal soft plaque within the CCA and about the carotid bifurcation. Vertebral arteries: Vertebral arteries patent within the neck. The right vertebral artery is dominant. Nonstenotic atherosclerotic plaque at the origin of the right vertebral artery. Stenosis at the origin of the left vertebral artery. Sites of up to moderate stenosis within the distal V2 left vertebral artery. Skeleton: Cervical spondylosis. No acute bony abnormality or aggressive osseous lesion. Other neck: Multiple nodules within the left thyroid lobe, the largest measuring 2.8 cm. Also of note, a nodule within the inferior left thyroid lobe measures 2.2 cm (series 6, image 284). No cervical lymphadenopathy. Mild Upper chest: No consolidation within the imaged lung apices. Review of the MIP images confirms the above findings CTA HEAD FINDINGS Anterior circulation: The intracranial internal carotid arteries are patent. Atherosclerotic plaque within both vessels abnormal with mild stenosis. There is occlusion of the left middle cerebral artery at the mid M1 segment. There is some enhancement of M2 and more distal left MCA vessels, although asymmetrically diminished as compared to the right. The M1 right middle cerebral artery is patent. No right M2 proximal branch occlusion or high-grade proximal stenosis is identified. The anterior cerebral arteries are patent. No intracranial aneurysm is identified. Posterior circulation: The intracranial vertebral arteries are patent. The basilar artery is patent. The posterior cerebral arteries are patent. Posterior communicating arteries are present bilaterally. Venous sinuses: Within the limitations of contrast  timing, no convincing thrombus. Anatomic variants: None significant. Review of the MIP images confirms the above findings CT Brain Perfusion Findings: Poor source data quality/bolus timing may limit the reliability of the provided perfusion values. CBF (<30%) Volume: 74mL Perfusion (Tmax>6.0s) volume: 115mL Mismatch Volume: 168mL Infarction Location:None identified Emergent results were called by telephone at the time of interpretation on 07/25/2021 at 12:17 pm to provider [facility] , who verbally acknowledged these results. IMPRESSION: CTA neck: 1. The common carotid and cervical internal carotid arteries are patent within the neck without stenosis. Minimal soft plaque within these vessels, as described. 2. Vertebral arteries patent within the neck. Atherosclerotic narrowing at the left vertebral artery origin (mild) and V2 segment (moderate). 3. Multiple left thyroid lobe nodules, measuring up to 2.8 cm. By report, ultrasound-guided FNA was performed on the largest nodule on 08/12/2019. However, there is a 2.2 cm nodule within the inferior left thyroid lobe which was not described on the prior thyroid ultrasound reports and may be new. A nonemergent repeat thyroid ultrasound is recommended for further evaluation. CTA head: 1. Occlusion of the left middle cerebral artery at the mid M1 segment. 2. Mild atherosclerotic plaque within the intracranial internal carotid arteries. CT perfusion head: 1. Poor source data quality/bolus timing may limit the reliability of the provided perfusion values. 2. The perfusion software identifies a region of critically hypoperfused parenchyma within the left MCA vascular territory totaling 118 mL (utilizing the Tmax>6 seconds threshold). No core infarct is identified. Reported mismatch volume: 118 mL Electronically Signed   By: Kellie Simmering D.O.   On: 06/28/2021 12:40    Labs:  CBC: Recent Labs    05/28/21 1501 07/16/21 1145 07/16/2021 1122 07/16/2021 2014 07/18/21 0051  07/18/21 0114  07/18/21 0352 07/18/21 0523  WBC 7.9 6.6 9.5  --   --   --   --  16.4*  HGB 12.9* 15.3 14.9   < > 12.9* 11.9* 12.9* 12.6*  HCT 40.0 48.1 46.3   < > 38.0* 35.0* 38.0* 40.4  PLT 262 141* 125*  --   --   --   --  131*   < > = values in this interval not displayed.    COAGS: Recent Labs    07/04/2021 1122 07/18/21 0523  INR 1.4* 1.6*  APTT 31 40*    BMP: Recent Labs    06/06/21 1348 07/16/21 1146 07/24/2021 1122 07/16/2021 2014 07/18/21 0051 07/18/21 0114 07/18/21 0352 07/18/21 0523  NA 139 134* 138   < > 144 146* 142 139  K 4.2 4.1 4.2   < > 4.6 4.8 4.0 4.5  CL 101 99 98  --   --   --   --  105  CO2 29 24 26   --   --   --   --  17*  GLUCOSE 114* 192* 139*  --   --   --   --  313*  BUN 19 21 23   --   --   --   --  20  CALCIUM 9.3 9.5 9.3  --   --   --   --  7.6*  CREATININE 1.16 1.46* 1.65*  --   --   --   --  2.00*  GFRNONAA >60 49* 42*  --   --   --   --  34*   < > = values in this interval not displayed.    LIVER FUNCTION TESTS: Recent Labs    06/27/2021 1122  BILITOT 1.5*  AST 22  ALT 22  ALKPHOS 50  PROT 7.4  ALBUMIN 3.7    Assessment and Plan:  80 y.o. male with acute CVA s/p revascularization of occluded Lt MCA achieving TICI 3 with Dr. Estanislado Pandy; hospital course complicated by bilateral PE R>L, s/p thrombectomy from right PA. Patient became hemodynamically unstable, the procedure was aborted, patient was put on tPA. A venous and arterial sheaths were left in place.   Patient critically ill, remains intubated and sedated and on pressors.  Right groin sheaths to be removed in IR today.   Further treatment plan per [facility]  Appreciate and agree with the plan.  NIR/ IR to follow.    Electronically Signed: Tera Mater, PA-C 07/18/2021, 2:34 PM   I spent a total of 25 Minutes at the the patient's bedside AND on the patient's hospital floor or unit, greater than 50% of which was counseling/coordinating care for Codes stroke and PE  thrombectomy.   This chart was dictated using voice recognition software.  Despite best efforts to proofread,  errors can occur which can change the documentation meaning.

## 2021-07-18 NOTE — Progress Notes (Addendum)
ANTICOAGULATION CONSULT NOTE  Pharmacy Consult:  Heparin Indication: Acute bilateral PEs and history of DVT  No Known Allergies  Patient Measurements: Height: 5\' 9"  (175.3 cm) Weight: 98.9 kg (218 lb 0.6 oz) IBW/kg (Calculated) : 70.7 Heparin Dosing Weight: 91 kg  Vital Signs: Temp: 99.6 F (37.6 C) (12/22 0800) Temp Source: Axillary (12/22 0800) BP: 89/56 (12/22 1722) Pulse Rate: 99 (12/22 1720)  Labs: Recent Labs    07/16/21 1145 07/16/21 1146 08-04-2021 1122 August 04, 2021 2014 07/18/21 0114 07/18/21 0352 07/18/21 0523 07/18/21 1549  HGB 15.3  --  14.9   < > 11.9* 12.9* 12.6*  --   HCT 48.1  --  46.3   < > 35.0* 38.0* 40.4  --   PLT 141*  --  125*  --   --   --  131*  --   APTT  --   --  31  --   --   --  40*  --   LABPROT  --   --  16.8*  --   --   --  19.0*  --   INR  --   --  1.4*  --   --   --  1.6*  --   HEPARINUNFRC  --   --   --   --   --   --   --  0.38  CREATININE  --  1.46* 1.65*  --   --   --  2.00*  --    < > = values in this interval not displayed.     Estimated Creatinine Clearance: 35.3 mL/min (A) (by C-G formula based on SCr of 2 mg/dL (H)).   Assessment: 35 YOM presented with right-sided weakness and aphagia, underwent revascularization of L MCA.  Also found to have acute bilateral PEs in setting of recent DVT, so Pharmacy consulted to dose IV heparin.  He was on Eliquis PTA, but taken off on 12/18 for EGD.  Patient taken to IR for PE thrombectomy and unable to aspirate significant clot. Patient with loss of pulses and coded in IR. Pt with multiple relative contraindications to lytics but decision made to give tenecteplase around ~0100.  Heparin resumed around 0830.  Heparin level and aPTT are both therapeutic.  RN reports that patient is bleeding minimally from the sheath site that was recently removed.  Goal of Therapy:  Heparin level 0.3-0.5 units/ml Monitor platelets by anticoagulation protocol: Yes   Plan:  Continue heparin gtt at 1100  units/hr Check 8 hr confirmatory heparin level  Monitor for bleeding resolution D/C aPTT checks  Desa Rech D. 09-03-1982, PharmD, BCPS, BCCCP 07/18/2021, 5:31 PM

## 2021-07-18 NOTE — Progress Notes (Signed)
eLink Physician-Brief Progress Note Patient Name: Jaime Whitaker DOB: 1941-01-28 MRN: [id number]   Date of Service  07/18/2021  HPI/Events of Note  Pt vent alarming high pressure Tachy 150's (New) Only on Fent (bedside has increased and bolused) On artic sun but his shivering is intermittent  After increase in Fentanyl BP 112/71  HR 102  O2 95% off pressor since start of shift Peak pressure 29 on 28/560/50%/5 PEEP Shivering resolved  eICU Interventions  Add propofol prn if Fentanyl not suffice to control shivering     Intervention Category Intermediate Interventions: Other:  [name] 07/18/2021, 9:31 PM

## 2021-07-18 NOTE — Progress Notes (Signed)
Initial Nutrition Assessment  DOCUMENTATION CODES:   Not applicable  INTERVENTION:   Initiate tube feeding via OG tube: Vital 1.5 at 20 ml/h and increase by 10 ml every 8 hours to goal rate of 60 ml/h (1440 ml per day) Prosource TF 45 ml TID  Provides 2280 kcal, 130 gm protein, 1094 ml free water daily    NUTRITION DIAGNOSIS:   Inadequate oral intake related to inability to eat as evidenced by NPO status.  GOAL:   Patient will meet greater than or equal to 90% of their needs  MONITOR:   TF tolerance, Labs  REASON FOR ASSESSMENT:   Consult, Ventilator Enteral/tube feeding initiation and management  ASSESSMENT:   Pt with PMH of OSA, CVA, on eliquis for recent discovered DVT, IDA, PMR, OSA. Eliquis held 12/18 for planned EGD for AVM however admitted with R sided weakness, aphasia and new L M1 occlusion.   Spoke with daughter who is at bedside. Per daughter pt fell in April and had a kyphoplasty. He has struggled with pain and immobility. Per daughter pt was not moving a lot for fear of further injury. Family noticed a decline starting in June/July of this year. She mentions that he was dx with a DVT at that time and was somewhat depressed that he could not do what he usually does.  Pt had a poor appetite but was recently started on prednisone and was eating really well.  Pt cannot read at baseline and family assists with all doctor visits.   12/21 s/p IR for mechanical thrombectomy L M1 with TICI 3 revascularization. Pt with PEA arrest in IR due to massive PE 12/22 started on arctic sun/normothermia ; s/p bronch   Pt discussed during ICU rounds and with RN. Pt on no sedation. Pt receiving nursing care during visit. Being placed on arctic sun.   Patient is currently intubated on ventilator support Temp (24hrs), Avg:98.6 F (37 C), Min:98.1 F (36.7 C), Max:99.6 F (37.6 C)   Medications reviewed and include: colace, SSI, protonix, miralax, prednisone Labs reviewed:  PO4: 6.3 A1C: 7.4   81F OG tube: per xray feeding tube coursing below the diaphragm   NUTRITION - FOCUSED PHYSICAL EXAM:  Flowsheet Row Most Recent Value  Orbital Region Unable to assess  Upper Arm Region Moderate depletion  Thoracic and Lumbar Region Unable to assess  [arctic sun]  Buccal Region Unable to assess  Temple Region Unable to assess  Clavicle Bone Region No depletion  Clavicle and Acromion Bone Region No depletion  Scapular Bone Region Unable to assess  Dorsal Hand No depletion  Patellar Region Unable to assess  [arctic sun]  Anterior Thigh Region Unable to assess  [arctic sun]  Posterior Calf Region Unable to assess  [edema]  Edema (RD Assessment) Mild  Hair Unable to assess  Eyes Unable to assess  Mouth Unable to assess  Skin Reviewed  Nails Reviewed       Diet Order:   Diet Order             Diet NPO time specified  Diet effective now                   EDUCATION NEEDS:   Not appropriate for education at this time  Skin:  Skin Assessment: Reviewed RN Assessment  Last BM:  unknown  Height:   Ht Readings from Last 1 Encounters:  08-01-21 5\' 9"  (1.753 m)    Weight:   Wt Readings from Last 1 Encounters:  07/06/2021 98.9 kg    BMI:  Body mass index is 32.2 kg/m.  Estimated Nutritional Needs:   Kcal:  2200  Protein:  120-140 grams  Fluid:  >2 L/day  Cammy Copa., RD, LDN, CNSC See AMiON for contact information

## 2021-07-18 NOTE — Procedures (Signed)
Bronchoscopy Procedure Note  BENJAMINE STROUT  [id number]  1941-02-25  Date:07/18/21  Time:12:03 PM   Provider Performing:Ronelle Michie M Verlee Monte   Procedure(s):  Flexible Bronchoscopy [id number])  Indication(s) Airway advancement for high-riding endotracheal tube  Consent Consent is verbal from daughter and wife.  Anesthesia Versed 11m Fentanyl 100 mcg Rocuronium 80 mg   Time Out Verified patient identification, verified procedure, site/side was marked, verified correct patient position, special equipment/implants available, medications/allergies/relevant history reviewed, required imaging and test results available.   Sterile Technique Usual hand hygiene, masks, gowns, and gloves were used   Procedure Description Bronchoscope advanced through endotracheal tube and into airway. ETT cuff likely at or just above level of vocal cords with tip of tube at level of cricoid. Cuff deflated and tube advanced over bronchoscope. No desaturation. Procedure complicated by hypotension which briefly required uptitration of epinephrine gtt. Post procedure CXR shows tube tip 5cm from carina.  Findings:  - lower trachea with blood tinged secretions but otherwise normal central airway exam - ETT advanced to satisfactory position under direct visualization   Complications/Tolerance As above hypotension which briefly required uptitration of epinephrine gtt    EBL Minimal   Specimen(s) None

## 2021-07-18 NOTE — Progress Notes (Signed)
Started cEEG study.  Notified [facility] monitoring.  Tested patient event button. 

## 2021-07-18 NOTE — Progress Notes (Addendum)
NAME:  Jaime Whitaker, MRN:  [id number], DOB:  26-Apr-1941, LOS: 1 ADMISSION DATE:  07/08/2021, CONSULTATION DATE:  07/06/2021 REFERRING MD:  Corliss Skains, CHIEF COMPLAINT:  weakness   History of Present Illness:  80yM on eliquis for ?recently discovered DVT, IDA, PMR, OSA, CVA in 1994. History is obtained through chart review and discussion with the patient's wife. He was planning to undergo EGD for intervention on AVM felt to be potential culprit for IDA today and had held eliquis since 12/18. Today had R sided weakness, aphasia and gaze deviation.  He was also noted to be hypoxic in ED requiring NRB. Found to have L M1 occlusion now s/p aspiration with TICI 3 revascularization. Remained hypoxic after reversal of NMB, left intubated and arrived to neuro ICU on 100 of neo, 50 of propofol, saline infusion at 75 mL/hr  Pertinent  Medical History  DVT IDA PMR OSA CVA  Significant Hospital Events: Including procedures, antibiotic start and stop dates in addition to other pertinent events   07/09/2021 intubated, mechanical thrombectomy L M1 with TICI 3 revascularization  Interim History / Subjective:  Massive PE with BP well below baseline even accounting for sedation. Challenging thrombectomy, in and out of bradycardic/PEA arrest for abou 30 minutes. Brought from IR with R femoral venous, arterial sheaths on epi gtt which was weaned to 4-5 mcg/min over course of night.   Objective   Blood pressure (!) 112/92, pulse (!) 119, temperature 99.6 F (37.6 C), temperature source Axillary, resp. rate (!) 24, height 5\' 9"  (1.753 m), weight 98.9 kg, SpO2 100 %.    Vent Mode: PRVC FiO2 (%):  [60 %-100 %] 60 % Set Rate:  [18 bmp-28 bmp] 28 bmp Vt Set:  [560 mL] 560 mL PEEP:  [5 cmH20] 5 cmH20 Plateau Pressure:  [13 cmH20-21 cmH20] 19 cmH20   Intake/Output Summary (Last 24 hours) at 07/18/2021 0955 Last data filed at 07/18/2021 0800 Gross per 24 hour  Intake 3184.32 ml  Output 1845 ml  Net 1339.32 ml    Filed Weights   07/19/2021 1126 07/25/2021 1550  Weight: 104.3 kg 98.9 kg    Examination: General appearance: 80 y.o., male, intubated Eyes: pupils equal very sluggishly reactive if at all HENT: NCAT; MMM Neck: Trachea midline; no lymphadenopathy, no JVD Lungs: rhonchorous, equal chest rise CV: RRR, no murmur  Abdomen: Soft, non-tender; non-distended, BS present  Extremities: 1+ peripheral edema, warm Skin: Normal turgor and texture; no rash Neuro: does not withdraw or posture to noxious stimuli  CTA Chest with PE with large clot burden, right heart strain, LLL occlusion/atelectasis  TTE pending   Resolved Hospital Problem list     Assessment & Plan:   # Left M1 occlusion s/p thrombectomy: - f/u HgbA1c, lipids - MR brain wo at 72h as below  - frequent neuro checks - SBP 120-140 - ASA 81 daily - PT/OT/speech eventually - stroke team following  # Acute hypoxic respiratory failure: # History of OSA PE and LLL occlusion/secretions. - position left lung up  - full vent support with lung protective settings, can ideally keep peep < 10 in setting acute neurologic injury - RASS -2 to -3 but not requiring sedation currently - s/p thrombectomy, systemic TNK and now on heparin gtt  # PEA arrest Due to PE, s/p systemic TNK - f/u TTE - normothermia protocol - MRI at 72h - watch for seizure activity  # Hypotension: May be combination of post arrest vasoplegia, PE. - wean epi for SBP 120-140  #  PMR - prednisone 5 mg daily  # Non-oliguric AKI vs CKD: - limit nephrotoxic medications - CTM  # DVT - heparin gtt as above   Best Practice (right click and "Reselect all SmartList Selections" daily)   Diet/type: NPO w/ meds via tube DVT prophylaxis: systemic heparin GI prophylaxis: PPI Lines: Central line and Arterial Line Foley:  Yes, and it is still needed Code Status:  full code Last date of multidisciplinary goals of care discussion [Family updated again today at  bedside. DNR after discussion with Dr. Peggye Pitt last night. Tentatively plan to reassess goals of care at 3 days to see if making progress toward recovery and assess extent of anoxic injury with MRI]  Critical care time: 35 minutes

## 2021-07-18 NOTE — Progress Notes (Signed)
Orthopedic Tech Progress Note Patient Details:  Jaime Whitaker 1940/09/27 [id number]  Patient ID: Jaime Whitaker, male   DOB: 1941/04/16, 80 y.o.   MRN: [id number]  Jaime Whitaker 07/18/2021, 6:04 PM Delivered 5 pound sand bag. Could not locate an order.

## 2021-07-18 NOTE — Progress Notes (Addendum)
PCCM INTERVAL PROGRESS NOTE  Called to bedside in IR for cardiac arrest. Patient suffered PEA arrest towards the end of the thrombectomy procedure. I arrived with CPR in progress. Thrombectomy procedure was technically difficult due to torturous vessels. Only minimal clot able to be removed.  See code sheet for ACLS details. Coded off and on for about 30 minutes.There was extended discussion between myself, Dr. Peggye Pitt (PCCM attending) and Dr Elby Showers (IR) and our pharmacsit colleagues regarding the administration of systemic thrombolytics. After full discussion of risks and benefits including intracranial hemorrhagic conversion of stroke (admitting diagnosis) and a discussion with the patients family the descision was made to administer TNK IV push durine CPR. Pulses were regained. Patient remains peri-arrest secondary to obstructive and cardiogenic shock as well as mixed respiratory failure secondary to PE.   BP 115/90 Comment: titrating levo   Pulse 98    Temp 98.7 F (37.1 C) (Axillary)    Resp 20    Ht 5\' 9"  (1.753 m)    Wt 98.9 kg    SpO2 95%    BMI 32.20 kg/m   Middle aged male on vent.  Clear breath sounds Tachycardic, regular, rates 150s.  Soft abdomen, non distended Neurologically he is sedated. Pupils pinpoint bilaterally.  No edema.    Assesment  Massive PE Obstructive shock Cardiogenic shock  - ICU hemodynamic monitoring - TNK given during arrest as above - Epinephrine infusion to keep SBP 120-140 mmHg per neuro IR protocols - echo pending - Continue heparin - Patient is DNR should he arrest again based on discussion between Dr. (PCCM attending) and multiple family members including the patients wife.   CVA - Continue present management - Neurology, IR following - If stabilizes he will need follow up CT to rule out hemorrhagic conversion s/p lytics.  Acute hypoxemic/hypercarbic respiratory failure: - Full vent support - ABG reviewed and settings adjusted -  Repeat ABG   Critical care time 60 minutes.   Peggye Pitt, AGACNP-BC [facility]  See Amion for personal pager PCCM on call pager [phone] until 7pm. Please call Elink 7p-7a. [phone]  07/18/2021 1:06 AM

## 2021-07-18 NOTE — Progress Notes (Signed)
Bedside report received from IR nurse. Patient has venous access in place. Nurse said we did not need to chart anything for the 2nd IR visit since it was venous access. Will continue to chart q2 vascular checks from 1st IR procedure.

## 2021-07-18 NOTE — Progress Notes (Signed)
°  Transition of Care [facility]) Screening Note   Patient Details  Name: Jaime Whitaker Date of Birth: [birth year]   Transition of Care [facility]) CM/SW Contact:    Mearl Latin, LCSW Phone Number: 07/18/2021, 4:47 PM    Transition of Care Department [facility]) has reviewed patient and no TOC needs have been identified at this time. We will continue to monitor patient advancement through interdisciplinary progression rounds. If new patient transition needs arise, please place a TOC consult.

## 2021-07-18 NOTE — Progress Notes (Signed)
Patient unavailable for EEG hookup due to echo in room and now tube exchange procedure.  Per RN, re-attempt at 1050.

## 2021-07-18 NOTE — Progress Notes (Signed)
°  Echocardiogram 2D Echocardiogram has been performed.  Jaime Whitaker F 07/18/2021, 9:53 AM

## 2021-07-18 NOTE — Progress Notes (Signed)
STROKE TEAM PROGRESS NOTE   INTERVAL HISTORY Patient presented with aphasia and right hemiplegia and yesterday underwent emergent mechanical thrombectomy for left M1 occlusion with excellent  TICI 3 revascularization but unfortunately last night developed massive pulmonary embolism with low blood pressure developed bradycardic PEA arrest during pulmonary thrombectomy procedure requiring 30 minutes of CPR before  ROSC.  He has remained unresponsive and on full ventilatory support and vasopressor support. Patient seen at the bedside with daughter. Developed massive PE and went to IR overnight for a pulmonary arterial thrombectomy with a subsequent PEA arrest in IR.  He was treated with IV TNKase for systemic thrombolysis.  CCM plan to initiate arctic sun for possible hypoxic and an overnight EEG. Plan for MRI after arctic sun is removed. Poor neurological exam, on epienphrine drip to maintain blood pressure. CT scan of the head this morning post pulmonary IR procedure and TNK shows no hemorrhage but evolving posterior division left MCA infarct.  His neurological exam remains quite poor.  His daughter is at the bedside Vitals:   07/18/21 0500 07/18/21 0600 07/18/21 0630 07/18/21 0700  BP: 124/83 (!) 124/92 119/84 117/81  Pulse:      Resp: (!) 28 19 (!) 24 18  Temp:      TempSrc:      SpO2:      Weight:      Height:       CBC:  Recent Labs  Lab 06/29/2021 1122 07/01/2021 2014 07/18/21 0352 07/18/21 0523  WBC 9.5  --   --  16.4*  NEUTROABS 5.4  --   --  14.0*  HGB 14.9   < > 12.9* 12.6*  HCT 46.3   < > 38.0* 40.4  MCV 94.3  --   --  93.7  PLT 125*  --   --  131*   < > = values in this interval not displayed.   Basic Metabolic Panel:  Recent Labs  Lab 07/12/2021 1122 07/19/2021 2014 07/18/21 0352 07/18/21 0523  NA 138   < > 142 139  K 4.2   < > 4.0 4.5  CL 98  --   --  105  CO2 26  --   --  17*  GLUCOSE 139*  --   --  313*  BUN 23  --   --  20  CREATININE 1.65*  --   --  2.00*   CALCIUM 9.3  --   --  7.6*   < > = values in this interval not displayed.   Lipid Panel:  Recent Labs  Lab 07/18/21 0523  CHOL 131  TRIG 98  HDL 40*  CHOLHDL 3.3  VLDL 20  LDLCALC 71   HgbA1c:  Recent Labs  Lab 07/18/21 0523  HGBA1C 7.4*   Urine Drug Screen: No results for input(s): LABOPIA, COCAINSCRNUR, LABBENZ, AMPHETMU, THCU, LABBARB in the last 168 hours.  Alcohol Level  Recent Labs  Lab 07/07/2021 1126  ETH <10    IMAGING past 24 hours CT HEAD WO CONTRAST (5MM)  Result Date: 07/18/2021 CLINICAL DATA:  80 year old male status post left MCA M1 occlusion, revascularization. EXAM: CT HEAD WITHOUT CONTRAST TECHNIQUE: Contiguous axial images were obtained from the base of the skull through the vertex without intravenous contrast. COMPARISON:  06/29/2021 and earlier. FINDINGS: Brain: No midline shift, mass effect, or evidence of intracranial mass lesion. No ventriculomegaly. No acute intracranial hemorrhage identified. However, there is loss of gray-white matter differentiation in the posterior left hemisphere, most apparent on  series 5, images 13 and 18. Posterior left MCA territory most affected. Deep gray matter nuclei relatively spared. Gray-white matter differentiation elsewhere appears within normal limits. Vascular: Calcified atherosclerosis at the skull base. Some residual intravascular contrast is suspected. Skull: No acute osseous abnormality identified. Sinuses/Orbits: Stable scattered paranasal sinus fluid and mucosal thickening in the setting of intubation. Tympanic cavities and mastoids remain clear. Other: No acute orbit or scalp soft tissue finding. Intubated. Oral enteric tube visible. IMPRESSION: 1. Early/evolving posterior Left MCA infarct. Probably moderate-sized infarct with subtle cytotoxic edema at this time. No associated hemorrhage or mass effect. 2. Elsewhere stable non contrast CT appearance of the brain. Electronically Signed   By: Odessa FlemingH  Hall M.D.   On:  07/18/2021 06:54   CT HEAD WO CONTRAST (5MM)  Result Date: Sep 01, 2020 CLINICAL DATA:  Stroke, follow up recent CVA s/p revascularization assess for hemorrhagic conversion. EXAM: CT HEAD WITHOUT CONTRAST TECHNIQUE: Contiguous axial images were obtained from the base of the skull through the vertex without intravenous contrast. COMPARISON:  None. FINDINGS: Brain: Normal anatomic configuration. Parenchymal volume loss is commensurate with the patient's age. Mild periventricular white matter changes are present likely reflecting the sequela of small vessel ischemia. Remote lacunar infarcts are noted within the thalami bilaterally. No abnormal intra or extra-axial mass lesion or fluid collection. No abnormal mass effect or midline shift. No evidence of acute intracranial hemorrhage or infarct. Ventricular size is normal. Cerebellum unremarkable. Vascular: No asymmetric hyperdense vasculature at the skull base. Skull: Intact Sinuses/Orbits: Mucous retention cyst or polyp within the left maxillary sinus noted. Remaining paranasal sinuses are clear. Ocular lenses have been removed. Orbits are otherwise unremarkable. Other: Mastoid air cells and middle ear cavities are clear. IMPRESSION: No acute intracranial hemorrhage or infarct. Stable remote lacunar infarcts within the thalami bilaterally. Stable senescent change. Electronically Signed   By: Helyn NumbersAshesh  Parikh M.D.   On: Sep 01, 2020 22:04   CT Angio Chest Pulmonary Embolism (PE) W or WO Contrast  Result Date: Sep 01, 2020 CLINICAL DATA:  Hypoxia, recent deep vein thrombosis EXAM: CT ANGIOGRAPHY CHEST WITH CONTRAST TECHNIQUE: Multidetector CT imaging of the chest was performed using the standard protocol during bolus administration of intravenous contrast. Multiplanar CT image reconstructions and MIPs were obtained to evaluate the vascular anatomy. CONTRAST:  70mL OMNIPAQUE IOHEXOL 350 MG/ML SOLN COMPARISON:  11/10/2020 CT chest FINDINGS: Cardiovascular: Large right  pulmonary artery saddle embolus extending substantially into the right upper lobe, right middle lobe, and right lower lobe pulmonary arteries, high level of occlusion. Left pulmonary artery saddle embolus extending into the upper lobe and lower lobe pulmonary arteries. Clot burden is heavy. Dilated right ventricle with right ventricular to left ventricular ratio of 1.47. Mild generalized cardiomegaly is also present. Coronary, aortic arch, and branch vessel atherosclerotic vascular disease. Ascending thoracic aorta 4.0 cm in diameter compatible with mild aneurysmal dilatation. Mediastinum/Nodes: Left thyroid nodules including a solid-appearing 2.4 cm left thyroid nodule on image 9 of series 6, this was previously biopsied on 08/12/2019. This has been evaluated on previous imaging. (ref: J Am Coll Radiol. 2015 Feb;12(2): 143-50).A nasogastric tube extends into the stomach. An endotracheal tube is present with tip 6.3 cm above the carina. Lungs/Pleura: Mild peribronchovascular atelectasis in the right lower lobe. Atelectasis posteriorly in the left upper lobe with complete atelectasis of the left lower lobe. There is some bandlike atelectasis peripherally in the left upper lobe as well. No significant pleural effusion. Upper Abdomen: The nasogastric tube terminates in the antrum. Possible gallbladder wall thickening although  this could be from gallbladder contraction. Musculoskeletal: Prior lower thoracic vertebral augmentations. Thoracic spondylosis. Review of the MIP images confirms the above findings. IMPRESSION: 1. Very large acute bilateral pulmonary emboli, with near complete occlusion of the right pulmonary artery and left pulmonary artery saddle emboli extending substantially in the left upper lobe and left lower lobe. Clot burden is heavy. Cardiomegaly especially the right heart indicating right heart strain. 2. Ascending thoracic aorta measures 4.0 cm in diameter, compatible with aneurysm. Recommend annual  imaging followup by CTA or MRA. This recommendation follows 2010 ACCF/AHA/AATS/ACR/ASA/SCA/SCAI/SIR/STS/SVM Guidelines for the Diagnosis and Management of Patients with Thoracic Aortic Disease. Circulation. 2010; 121: X735-H299. Aortic aneurysm NOS (ICD10-I71.9) 3. Complete atelectasis of the left lower lobe. Mild scattered atelectasis in the left upper lobe and right lower lobe. 4. Gallbladder wall thickening although this could be from gallbladder contraction rather than necessarily indicating inflammation. Critical Value/emergent results were called by telephone at the time of interpretation on 07/03/2021 at 6:35 pm to provider [facility] , who verbally acknowledged these results. Electronically Signed   By: Gaylyn Rong M.D.   On: 07/07/2021 18:42   DG CHEST PORT 1 VIEW  Result Date: 07/06/2021 CLINICAL DATA:  Intubation EXAM: PORTABLE CHEST 1 VIEW COMPARISON:  Portable exam 1620 hours compared to 06/26/2021 FINDINGS: Tip of endotracheal tube projects 4.6 cm above carina. Nasogastric tube extends into stomach. Normal heart size, mediastinal contours, and pulmonary vascularity. Atelectasis of LEFT lower lobe. Subsegmental atelectasis RIGHT base. Upper lungs clear. No definite pleural effusion or pneumothorax. IMPRESSION: Bibasilar atelectasis greater on LEFT. Electronically Signed   By: Ulyses Southward M.D.   On: 07/18/2021 16:32   CT HEAD CODE STROKE WO CONTRAST  Result Date: 07/03/2021 CLINICAL DATA:  Code stroke. Neuro deficit, acute, stroke suspected. Right facial droop. EXAM: CT HEAD WITHOUT CONTRAST TECHNIQUE: Contiguous axial images were obtained from the base of the skull through the vertex without intravenous contrast. COMPARISON:  06/26/2021 FINDINGS: Brain: Atrophy. Chronic small-vessel ischemic changes of the white matter. 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 No parenchymal finding of acute infarction, mass lesion, hemorrhage, hydrocephalus or extra-axial collection. Vascular: Hyperdense left  middle cerebral artery. Skull: Normal Sinuses/Orbits: Clear/normal Other: None ASPECTS ([place]berta Stroke Program Early CT Score) - Ganglionic level infarction (caudate, lentiform nuclei, internal capsule, insula, M1-M3 cortex): 7 - Supraganglionic infarction (M4-M6 cortex): 3 Total score (0-10 with 10 being normal): 10 IMPRESSION: 1. Hyperdense left middle cerebral artery worrisome for acute embolic occlusion. No parenchymal finding of stroke at this time. 2. Chronic small-vessel ischemic changes of the white matter. 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 3. ASPECTS is 10. 4. These results were communicated to Dr. Selina Cooley at 11:36 am on 07/26/2021 by text page via the [facility] messaging system. Electronically Signed   By: Paulina Fusi M.D.   On: 07/14/2021 11:37   CT ANGIO HEAD NECK W WO CM W PERF (CODE STROKE)  Result Date: 07/22/2021 CLINICAL DATA:  Neuro deficit, acute, stroke suspected. Additional history provided: Right-sided facial droop, right-sided flaccid. EXAM: CT ANGIOGRAPHY HEAD AND NECK CT PERFUSION BRAIN TECHNIQUE: Multidetector CT imaging of the head and neck was performed using the standard protocol during bolus administration of intravenous contrast. Multiplanar CT image reconstructions and MIPs were obtained to evaluate the vascular anatomy. Carotid stenosis measurements (when applicable) are obtained utilizing NASCET criteria, using the distal internal carotid diameter as the denominator. Multiphase CT imaging of the brain was performed following IV bolus contrast injection. Subsequent parametric perfusion maps were calculated using RAPID software. CONTRAST:  OMNIPAQUE  IOHEXOL 350 MG/ML SOLN COMPARISON:  Noncontrast head CT performed earlier today 06/27/2021. Thyroid ultrasound 07/11/2019. Report from ultrasound-guided thyroid FNA 08/12/2019. FINDINGS: CTA NECK FINDINGS Aortic arch: Standard aortic branching. Atherosclerotic plaque within the visualized aortic arch and proximal major branch vessels of the  neck. No hemodynamically significant innominate or proximal subclavian artery stenosis. Right carotid system: CCA and ICA patent within the neck without stenosis. Minimal soft plaque within the CCA and about the carotid bifurcation. Left carotid system: CCA and ICA patent within the neck without stenosis. Minimal soft plaque within the CCA and about the carotid bifurcation. Vertebral arteries: Vertebral arteries patent within the neck. The right vertebral artery is dominant. Nonstenotic atherosclerotic plaque at the origin of the right vertebral artery. Stenosis at the origin of the left vertebral artery. Sites of up to moderate stenosis within the distal V2 left vertebral artery. Skeleton: Cervical spondylosis. No acute bony abnormality or aggressive osseous lesion. Other neck: Multiple nodules within the left thyroid lobe, the largest measuring 2.8 cm. Also of note, a nodule within the inferior left thyroid lobe measures 2.2 cm (series 6, image 284). No cervical lymphadenopathy. Mild Upper chest: No consolidation within the imaged lung apices. Review of the MIP images confirms the above findings CTA HEAD FINDINGS Anterior circulation: The intracranial internal carotid arteries are patent. Atherosclerotic plaque within both vessels abnormal with mild stenosis. There is occlusion of the left middle cerebral artery at the mid M1 segment. There is some enhancement of M2 and more distal left MCA vessels, although asymmetrically diminished as compared to the right. The M1 right middle cerebral artery is patent. No right M2 proximal branch occlusion or high-grade proximal stenosis is identified. The anterior cerebral arteries are patent. No intracranial aneurysm is identified. Posterior circulation: The intracranial vertebral arteries are patent. The basilar artery is patent. The posterior cerebral arteries are patent. Posterior communicating arteries are present bilaterally. Venous sinuses: Within the limitations of  contrast timing, no convincing thrombus. Anatomic variants: None significant. Review of the MIP images confirms the above findings CT Brain Perfusion Findings: Poor source data quality/bolus timing may limit the reliability of the provided perfusion values. CBF (<30%) Volume: 40mL Perfusion (Tmax>6.0s) volume: 143mL Mismatch Volume: 162mL Infarction Location:None identified Emergent results were called by telephone at the time of interpretation on 07/11/2021 at 12:17 pm to provider [facility] , who verbally acknowledged these results. IMPRESSION: CTA neck: 1. The common carotid and cervical internal carotid arteries are patent within the neck without stenosis. Minimal soft plaque within these vessels, as described. 2. Vertebral arteries patent within the neck. Atherosclerotic narrowing at the left vertebral artery origin (mild) and V2 segment (moderate). 3. Multiple left thyroid lobe nodules, measuring up to 2.8 cm. By report, ultrasound-guided FNA was performed on the largest nodule on 08/12/2019. However, there is a 2.2 cm nodule within the inferior left thyroid lobe which was not described on the prior thyroid ultrasound reports and may be new. A nonemergent repeat thyroid ultrasound is recommended for further evaluation. CTA head: 1. Occlusion of the left middle cerebral artery at the mid M1 segment. 2. Mild atherosclerotic plaque within the intracranial internal carotid arteries. CT perfusion head: 1. Poor source data quality/bolus timing may limit the reliability of the provided perfusion values. 2. The perfusion software identifies a region of critically hypoperfused parenchyma within the left MCA vascular territory totaling 118 mL (utilizing the Tmax>6 seconds threshold). No core infarct is identified. Reported mismatch volume: 118 mL Electronically Signed   By: Marylyn Ishihara  Armandina Gemma D.O.   On: 07/05/2021 12:40    PHYSICAL EXAM Elderly African-American male who is intubated.  Not sedated. . Afebrile. Head is  nontraumatic. Neck is supple without bruit.    Cardiac exam no murmur or gallop. Lungs are clear to auscultation. Distal pulses are well felt.  Neurological Exam: Intubated not sedated. . Afebrile. Head is nontraumatic. Neck is supple without bruit.    Cardiac exam no murmur or gallop. Lungs are clear to auscultation. Distal pulses are well felt.  Neurological Exam ; patient is intubated and on ventilatory and vasopressor support Eyes are closed.  Right pupil is 2 mm not reactive left is 3 mm sluggishly reactive.  Eyes are disconjugate with left eye hypotrophic and exotropic.  Right corneal is absent left corneal is sluggish.  Fundi not visualized.  Also movements sluggish.  Weak cough and gag suctioning endotracheal tube.  Face is symmetric.  Tongue midline.  No spontaneous extremity movements.  No response to sternal rub.  No response to nailbed pressure in all 4 extremities. ASSESSMENT/PLAN Jaime Whitaker is a 80 y.o. male with history of HTN, anemia, sleep apnea, stroke in 1998, HLD, eliquis use for DVT in RLE up until 12/18 presenting with right sided weakness and aphasia. Arrived from [facility] as a code IR. Successful thrombectomy MCA, bilateral PE, pulmonary artery thrombectomy with PEA arrest in IR.   Stroke: L MCA infarct at M1 likely secondary source s/p Left M1 thrombectomy with TICI3 revascularization complicated by bilateral pulmonary emboli and PEA arrest Code Stroke- Hyperdense L MCA, chronic small-vessel ischemic changes of white matter. Old thalamic infarction  CTA head & neck-Occlusion of L MCA at mid M1. Mild atherosclerotic plaque.  CT perfusion- Left MCA vascular territory totally 138ml, no core infarct  MRI  pending 2D Echo pending LDL 71 HgbA1c 7.4 VTE prophylaxis - SCDs Eliquis (apixaban) daily prior to admission, now on No antithrombotic. IV TNK given  Therapy recommendations:  pending Disposition:  pending  Left MCA Thrombectomy TICI3 revascularizion of left MCA   Left intubated, unable to maintain O2 sats  Hypotension Requiring vasopressors Epinephrine Home BP medication- metoprolol 50mg   Hyperlipidemia Home meds:  Lovastatin 20mg , resumed in hospital LDL 71, goal < 70 Continue statin at discharge  Diabetes type II Controlled HgbA1c 7.4, goal < 7.0 SSI Tube feeding Vital HP   Other Stroke Risk Factors Advanced Age >/= 3  Cigarette smoker,advised to stop smoking ETOH use, alcohol level <10, advised to drink no more than 2 drink(s) a day Substance abuse - UDS:  THC No results found for requested labs within last 26280 hours., Cocaine No results found for requested labs within last 26280 hours.. Patient advised to stop using due to stroke risk. Obesity, Body mass index is 32.2 kg/m., BMI >/= 30 associated with increased stroke risk, recommend weight loss, diet and exercise as appropriate  Hx stroke/TIA Coronary artery disease Migraines Obstructive sleep apnea, on CPAP at home Congestive heart failure  Other Active Problems PEA Arrest  During bilateral pulmonary artery thrombectomy Initiation of [place] Sun by CCM Hypoxic injury?  Bilateral Pulmonary Embolism Bilateral pulmonary artery aspiration  TNK IVP during thrombectomy Respiratory Failure Mechanical ventilation Sedation with Propofol, fentanyl Management by CCM- appreciate assistance  Hospital day # 1  Patient seen and examined by NP/APP with MD. MD to update note as needed.   Janine Ores, DNP, FNP-BC [facility] Pager: [phone]   STROKE MD NOTE :  I have personally obtained history,examined this patient,  reviewed notes, independently viewed imaging studies, participated in medical decision making and plan of care.ROS completed by me personally and pertinent positives fully documented  I have made any additions or clarifications directly to the above note. Agree with note above.  Patient presented with left M1 occlusion and underwent emergent  successful thrombectomy with excellent revascularization but unfortunately last night developed massive pulmonary embolism with hypotension requiring pulmonary arterial thrombectomy which was not successful.  Systemic IV TN K but developed PEA arrest requiring 30 minutes of CPR before ROSC.  He remains unresponsive with poor neurological exam requiring full ventilatory support and vasopressor support.  Neurological exam is quite poor and may be complicated by significant component of hypoxic ischemic injury from his cardiac arrest superimposed upon left hemispheric infarct damage from his stroke.  Patient will need IV heparin anticoagulation for his massive PE but may be at increased risk for hemorrhagic transformation in his stroke but unfortunately we are left with little choice.  I had a long discussion with his daughter at the bedside and also spoke to his son over the phone his guarded prognosis and recommend continuing full support and treatment for his hypoxic risks with hemodynamic support for 48 to 72 hours and reassess the situation.  MRI scan of the brain at that time.  Bedside EEG today and  long-term monitoring if seizure activity is noted.  Discussed with Dr.Meier critical care medicine. This patient is critically ill and at significant risk of neurological worsening, death and care requires constant monitoring of vital signs, hemodynamics,respiratory and cardiac monitoring, extensive review of multiple databases, frequent neurological assessment, discussion with family, other specialists and medical decision making of high complexity.I have made any additions or clarifications directly to the above note.This critical care time does not reflect procedure time, or teaching time or supervisory time of PA/NP/Med Resident etc but could involve care discussion time.  I spent 60 minutes of neurocritical care time  in the care of  this patient.     Antony Contras, MD Medical Director [facility] Pager: [phone] 07/18/2021 4:16 PM   To contact Stroke Continuity provider, please refer to [email or web address] After hours, contact General Neurology

## 2021-07-18 NOTE — Progress Notes (Signed)
SLP Cancellation Note  Patient Details Name: RODEL GLASPY MRN: [id number] DOB: 05/18/1941   Cancelled treatment:       Reason Eval/Treat Not Completed: Patient not medically ready (Pt currently on the vent.)  Kynsli Haapala I. Vear Clock, MS, CCC-SLP Acute Rehabilitation Services Office number [phone] Pager [phone]  Scheryl Marten 07/18/2021, 8:56 AM

## 2021-07-18 NOTE — Progress Notes (Signed)
Chaplain responded to Code Blue and went directly to be with family.  Chaplain offered ministry of presence, care and concern.  Chaplain helped facilitate conversations with providers. Chaplain affirmed pt's wife in the difficult decisions she had to make. Chaplain attended family until they were ready to leave and escorted them out.  Vernell Morgans Chaplain

## 2021-07-18 NOTE — Progress Notes (Signed)
Groin site began bleeding slightly. Dressing shadowed. Pressure applied. Dr. Archer Asa notified. Orders to hold pressure for 20 min, reinforce dressing and apply sandbag.

## 2021-07-18 NOTE — Transfer of Care (Signed)
Immediate Anesthesia Transfer of Care Note  Patient: Jaime Whitaker  Procedure(s) Performed: IR WITH ANESTHESIA  Patient Location: ICU  Anesthesia Type:General  Level of Consciousness: Patient remains intubated per anesthesia plan  Airway & Oxygen Therapy: Patient remains intubated per anesthesia plan and Patient placed on Ventilator (see vital sign flow sheet for setting)  Post-op Assessment: Report given to RN and Post -op Vital signs reviewed and stable  Post vital signs: Reviewed and stable  Last Vitals:  Vitals Value Taken Time  BP 126/84 07/18/21 0215  Temp    Pulse 139 07/18/21 0208  Resp 28 07/18/21 0215  SpO2 92 % 07/18/21 0208  Vitals shown include unvalidated device data.  Last Pain:  Vitals:   07/03/2021 1940  TempSrc: Axillary  PainSc:          Complications: No notable events documented.

## 2021-07-18 NOTE — Progress Notes (Signed)
Inpatient Diabetes Program Recommendations  AACE/ADA: New Consensus Statement on Inpatient Glycemic Control (2015)  Target Ranges:  Prepandial:   less than 140 mg/dL      Peak postprandial:   less than 180 mg/dL (1-2 hours)      Critically ill patients:  140 - 180 mg/dL   Lab Results  Component Value Date   GLUCAP 287 (H) 07/18/2021   HGBA1C 7.4 (H) 07/18/2021    Review of Glycemic Control  Latest Reference Range & Units 07/18/21 03:28 07/18/21 08:10  Glucose-Capillary 70 - 99 mg/dL 711 (H) 657 (H)  (H): Data is abnormally high Diabetes history: Type 2 DM Outpatient Diabetes medications: none Current orders for Inpatient glycemic control: Novolog 0-15 units Q4H Prednisone 50 mg QA  Inpatient Diabetes Program Recommendations:    Consider also adding Novolog 4 units Q4H for tube feed coverage (to be stopped or held in the event tube feeds are stopped).   Thanks, Lujean Rave, MSN, RNC-OB Diabetes Coordinator [phone] (8a-5p)

## 2021-07-18 NOTE — Procedures (Signed)
Interventional Radiology Procedure Note  Procedure:  1) Pulmonary angiogram 2) Bilateral pulmonary artery aspiration thrombectomy 3) Ultrasound guided right common femoral arterial line placement   Findings: Please refer to procedural dictation for full description.  Large bilateral distal main pulmonary artery clot burden, R>L.  Initial main pulmonary artery pressure 58/22 (mean 35) mmHg.  Aspiration thrombectomy performed with 12L [place] yielding small volume of acute thrombus from right  pulmonary artery.    After multiple passes bilaterally which required re-accessing of the main pulmonary artery due to supremely tortuous right heart anatomy, there was abrupt change in hemodynamics and the patient experienced bradycardia with loss of pulse.  Anesthesia and the Code Team resuscitated with ROSC. I placed a right common femoral artery 5 Fr sheath for arterial pressure monitoring. Systemic t-PA was then administered due to inability to aspirate significant clot burden as progressive thrombosis was suspected etiology.  The patient was then stable enough to return to the ICU.  The 12 Fr, 45 cm right common femoral vein sheath was left in place, tip in the IVC.  2 perclose sutures were placed prior to sheath placement.  Additionally the right common femoral artery 5 Fr, 10 cm sheath was left in place.  Both were covered with sterile bandage.  The family was updated in person and ushered to bedside.  Complications:  Shock, cardiac arrest, as above.  Estimated Blood Loss: 100 mL  Recommendations: IR available as needed. Please contact IR for removal of indwelling right sheaths as clinically indicated.   Marliss Coots, MD Pager: [phone]

## 2021-07-18 NOTE — Progress Notes (Signed)
Pt transported to IR and back without complications. RT will continue to monitor.

## 2021-07-18 NOTE — TOC CAGE-AID Note (Signed)
Transition of Care [facility]) - CAGE-AID Screening   Patient Details  Name: Jaime Whitaker MRN: [id number] Date of Birth: 05/31/1941  Transition of Care [facility]) CM/SW Contact:    Sugey Trevathan C Tarpley-Carter, LCSWA Phone Number: 07/18/2021, 9:23 AM   Clinical Narrative: Pt is unable to participate in Cage Aid.  Azucena Dart Tarpley-Carter, MSW, LCSW-A Pronouns:  She/Her/Hers [place] Transitions of Care Clinical Social Worker Direct Number:  [phone] [email or web address]  CAGE-AID Screening: Substance Abuse Screening unable to be completed due to: : Patient unable to participate             Substance Abuse Education Offered: No

## 2021-07-18 NOTE — Progress Notes (Signed)
ETT advanced to 27 at the lip during bedside bronch. RT will continue to monitor.

## 2021-07-18 NOTE — Sedation Documentation (Signed)
Code complete. See documentation on code sheet

## 2021-07-18 NOTE — Anesthesia Postprocedure Evaluation (Signed)
Anesthesia Post Note  Patient: Jaime Whitaker  Procedure(s) Performed: IR WITH ANESTHESIA     Patient location during evaluation: ICU Anesthesia Type: General Level of consciousness: sedated and patient remains intubated per anesthesia plan Pain management: pain level controlled Vital Signs Assessment: vitals unstable Respiratory status: patient remains intubated per anesthesia plan Cardiovascular status: unstable Postop Assessment: no apparent nausea or vomiting Anesthetic complications: no Comments: Patient underwent multiple rounds of CPR during the procedure, see intraop record for details. Transferred to ICU on epinephrine infusion.   No notable events documented.  Last Vitals:  Vitals:   07/18/21 0215 07/18/21 0230  BP: 126/84 127/85  Pulse:  (!) 134  Resp: (!) 28 (!) 23  Temp:    SpO2:  (!) 86%    Last Pain:  Vitals:   07/20/2021 1940  TempSrc: Axillary  PainSc:                  Beryle Lathe

## 2021-07-18 NOTE — Progress Notes (Signed)
ANTICOAGULATION CONSULT NOTE  Pharmacy Consult:  Heparin Indication: Acute bilateral PEs and history of DVT  No Known Allergies  Patient Measurements: Height: 5\' 9"  (175.3 cm) Weight: 98.9 kg (218 lb 0.6 oz) IBW/kg (Calculated) : 70.7 Heparin Dosing Weight: 91 kg  Vital Signs: Temp: 98.1 F (36.7 C) (12/22 0330) Temp Source: Axillary (12/22 0330) BP: 119/84 (12/22 0630) Pulse Rate: 121 (12/22 0345)  Labs: Recent Labs    07/16/21 1145 07/16/21 1146 07/10/2021 1122 07/16/2021 2014 07/18/21 0114 07/18/21 0352 07/18/21 0523  HGB 15.3  --  14.9   < > 11.9* 12.9* 12.6*  HCT 48.1  --  46.3   < > 35.0* 38.0* 40.4  PLT 141*  --  125*  --   --   --  131*  APTT  --   --  31  --   --   --  40*  LABPROT  --   --  16.8*  --   --   --  19.0*  INR  --   --  1.4*  --   --   --  1.6*  CREATININE  --  1.46* 1.65*  --   --   --  2.00*   < > = values in this interval not displayed.     Estimated Creatinine Clearance: 35.3 mL/min (A) (by C-G formula based on SCr of 2 mg/dL (H)).   Medical History: Past Medical History:  Diagnosis Date   Atherosclerosis    Emphysema lung (HCC)    Hypercholesteremia    Hypertension    Iron deficiency anemia    Lumbar compression fracture (HCC) 10/2020   T1 T2 Lumbar fracture   Major depressive disorder    Polymyalgia rheumatica (HCC)    Sleep apnea    Stroke (HCC) 1994    Assessment: 49 YOM presented with right-sided weakness and aphagia, underwent revascularization of L MCA.  Also found to have acute bilateral PEs in setting of recent DVT, so Pharmacy consulted to dose IV heparin.  He was on Eliquis PTA, but taken off on 12/18 for EGD.  Per discussion with CCM, OK to start IV heparin while awaiting CT result given burden of PE.  CT negative for bleed.  Pt taken to IR for PE thrombectomy  unable to aspirate significant clot. Pt with loss of pulses and coded in IR. Pt with multiple relative contraindications to lytics but decision made to give  tenecteplase. Given ~0100.   Post tenecteplase labs: INR 1.6, Hgb 12.6, Plt 131, aPTT 40 sec. Okay to start heparin with aPTT < 80 sec.  Goal of Therapy:  Heparin level 0.3-0.5 units/ml Monitor platelets by anticoagulation protocol: Yes   Plan:  Restart heparin gtt at 1100 units/hr. No bolus Check 8 hr heparin level   1/19, PharmD, BCPS Please see amion for complete clinical pharmacist phone list 07/18/2021, 6:57 AM

## 2021-07-19 ENCOUNTER — Inpatient Hospital Stay (HOSPITAL_COMMUNITY): Payer: Medicare HMO

## 2021-07-19 DIAGNOSIS — Z515 Encounter for palliative care: Secondary | ICD-10-CM

## 2021-07-19 DIAGNOSIS — I469 Cardiac arrest, cause unspecified: Secondary | ICD-10-CM

## 2021-07-19 DIAGNOSIS — J9602 Acute respiratory failure with hypercapnia: Secondary | ICD-10-CM

## 2021-07-19 DIAGNOSIS — G9340 Encephalopathy, unspecified: Secondary | ICD-10-CM

## 2021-07-19 DIAGNOSIS — J9601 Acute respiratory failure with hypoxia: Secondary | ICD-10-CM | POA: Diagnosis not present

## 2021-07-19 DIAGNOSIS — I63512 Cerebral infarction due to unspecified occlusion or stenosis of left middle cerebral artery: Secondary | ICD-10-CM | POA: Diagnosis not present

## 2021-07-19 DIAGNOSIS — G9389 Other specified disorders of brain: Secondary | ICD-10-CM

## 2021-07-19 DIAGNOSIS — Z7189 Other specified counseling: Secondary | ICD-10-CM

## 2021-07-19 DIAGNOSIS — G931 Anoxic brain damage, not elsewhere classified: Secondary | ICD-10-CM

## 2021-07-19 DIAGNOSIS — I63312 Cerebral infarction due to thrombosis of left middle cerebral artery: Secondary | ICD-10-CM | POA: Diagnosis not present

## 2021-07-19 DIAGNOSIS — R569 Unspecified convulsions: Secondary | ICD-10-CM

## 2021-07-19 LAB — PHOSPHORUS
Phosphorus: 2.8 mg/dL (ref 2.5–4.6)
Phosphorus: 1.8 mg/dL — ABNORMAL LOW (ref 2.5–4.6)

## 2021-07-19 LAB — CBC
HCT: 34.1 % — ABNORMAL LOW (ref 39.0–52.0)
Hemoglobin: 10.8 g/dL — ABNORMAL LOW (ref 13.0–17.0)
MCH: 29.7 pg (ref 26.0–34.0)
MCHC: 31.7 g/dL (ref 30.0–36.0)
MCV: 93.7 fL (ref 80.0–100.0)
Platelets: 126 10*3/uL — ABNORMAL LOW (ref 150–400)
RBC: 3.64 MIL/uL — ABNORMAL LOW (ref 4.22–5.81)
RDW: 18.4 % — ABNORMAL HIGH (ref 11.5–15.5)
WBC: 15.7 10*3/uL — ABNORMAL HIGH (ref 4.0–10.5)
nRBC: 0.3 % — ABNORMAL HIGH (ref 0.0–0.2)

## 2021-07-19 LAB — GLUCOSE, CAPILLARY
Glucose-Capillary: 172 mg/dL — ABNORMAL HIGH (ref 70–99)
Glucose-Capillary: 180 mg/dL — ABNORMAL HIGH (ref 70–99)
Glucose-Capillary: 233 mg/dL — ABNORMAL HIGH (ref 70–99)
Glucose-Capillary: 296 mg/dL — ABNORMAL HIGH (ref 70–99)
Glucose-Capillary: 254 mg/dL — ABNORMAL HIGH (ref 70–99)

## 2021-07-19 LAB — HEPARIN LEVEL (UNFRACTIONATED)
Heparin Unfractionated: 0.32 IU/mL (ref 0.30–0.70)
Heparin Unfractionated: 0.33 IU/mL (ref 0.30–0.70)
Heparin Unfractionated: <0.1 IU/mL — ABNORMAL LOW (ref 0.30–0.70)

## 2021-07-19 LAB — COMPREHENSIVE METABOLIC PANEL
ALT: 101 U/L — ABNORMAL HIGH (ref 0–44)
AST: 78 U/L — ABNORMAL HIGH (ref 15–41)
Albumin: 2.5 g/dL — ABNORMAL LOW (ref 3.5–5.0)
Alkaline Phosphatase: 41 U/L (ref 38–126)
Anion gap: 15 (ref 5–15)
BUN: 32 mg/dL — ABNORMAL HIGH (ref 8–23)
CO2: 22 mmol/L (ref 22–32)
Calcium: 8.2 mg/dL — ABNORMAL LOW (ref 8.9–10.3)
Chloride: 105 mmol/L (ref 98–111)
Creatinine, Ser: 1.87 mg/dL — ABNORMAL HIGH (ref 0.61–1.24)
GFR, Estimated: 36 mL/min — ABNORMAL LOW (ref >60)
Glucose, Bld: 279 mg/dL — ABNORMAL HIGH (ref 70–99)
Potassium: 4 mmol/L (ref 3.5–5.1)
Sodium: 142 mmol/L (ref 135–145)
Total Bilirubin: 0.9 mg/dL (ref 0.3–1.2)
Total Protein: 5.6 g/dL — ABNORMAL LOW (ref 6.5–8.1)

## 2021-07-19 LAB — TRIGLYCERIDES: Triglycerides: 136 mg/dL (ref <150)

## 2021-07-19 LAB — MAGNESIUM
Magnesium: 2.2 mg/dL (ref 1.7–2.4)
Magnesium: 2.1 mg/dL (ref 1.7–2.4)

## 2021-07-19 MED ORDER — NOREPINEPHRINE 4 MG/250ML-% IV SOLN
2.0000 ug/min | INTRAVENOUS | Status: DC
  Administered 2021-07-19: 10:00:00 2 ug/min via INTRAVENOUS
  Administered 2021-07-19: 17:00:00 8 ug/min via INTRAVENOUS
  Administered 2021-07-20 (×3): 9 ug/min via INTRAVENOUS
  Filled 2021-07-19 (×6): qty 250

## 2021-07-19 MED ORDER — SODIUM CHLORIDE 0.9 % IV SOLN: 250.0000 mL | INTRAVENOUS | Status: DC

## 2021-07-19 MED ORDER — PRAVASTATIN SODIUM 10 MG PO TABS
20.0000 mg | ORAL_TABLET | Freq: Every day | ORAL | Status: DC
Start: 2021-07-19 — End: 2021-07-19

## 2021-07-19 MED ORDER — INSULIN ASPART 100 UNIT/ML IJ SOLN
4.0000 [IU] | INTRAMUSCULAR | Status: DC
  Administered 2021-07-19 – 2021-07-24 (×31): 4 [IU] via SUBCUTANEOUS

## 2021-07-19 MED ORDER — PANTOPRAZOLE 2 MG/ML SUSPENSION
40.0000 mg | Freq: Every day | ORAL | Status: DC
  Administered 2021-07-19 – 2021-07-21 (×3): 40 mg
  Filled 2021-07-19 (×3): qty 20

## 2021-07-19 MED ORDER — PRAVASTATIN SODIUM 10 MG PO TABS
20.0000 mg | ORAL_TABLET | Freq: Every day | ORAL | Status: DC
  Administered 2021-07-19 – 2021-07-31 (×13): 20 mg
  Filled 2021-07-19 (×13): qty 2

## 2021-07-19 NOTE — Progress Notes (Signed)
Referring Physician(s): Stroke and Critical care team   Supervising Physician: Ruel Favors  Patient Status:  [facility] - In-pt  Chief Complaint:  Code stroke, s/p revascularization of occluded Lt MCA achieving TICI 3 with Dr. Orpah Clinton course complicated by bilateral PE R>L, s/p thrombectomy from right PA. Patient became hemodynamically unstable, the procedure was aborted, patient was put on tPA and transitioned to heparin infusion.  Venous and arterial sheaths were left in place on 12/21, sheaths were removed in IR on 12/22.    Subjective:  Pt laying in bed, intubated and sedated.  Family member and RN at bedside, patient showed some movement on one of his extremity last night, unclear laterality.  No movement seen this morning.    Allergies: Patient has no known allergies.  Medications: Prior to Admission medications   Medication Sig Start Date End Date Taking? Authorizing Provider  Bismuth 262 MG CHEW Chew 2 each by mouth every 6 (six) hours. Patient taking differently: Chew 2 each by mouth every 6 (six) hours as needed (heartburn). 06/17/21  Yes Dolores Frame, MD  brimonidine (ALPHAGAN) 0.2 % ophthalmic solution Place 1 drop into both eyes daily. 05/28/21  Yes [provider]  furosemide (LASIX) 40 MG tablet Take 40 mg by mouth daily.   Yes [provider]  ibuprofen (ADVIL) 200 MG tablet Take 200-600 mg by mouth every 6 (six) hours as needed for moderate pain.   Yes [provider]  IFEREX 150 150 MG capsule Take 150 mg by mouth 2 (two) times daily. 06/23/21  Yes [provider]  latanoprost (XALATAN) 0.005 % ophthalmic solution Place 1 drop into both eyes at bedtime. 03/30/21  Yes [provider]  lovastatin (MEVACOR) 20 MG tablet Take 20 mg by mouth at bedtime.   Yes [provider]  metoprolol (LOPRESSOR) 50 MG tablet Take 50 mg by mouth daily.   Yes [provider]  Multiple  Vitamins-Minerals (CENTRUM MEN PO) Take 1 tablet by mouth daily.   Yes [provider]  Multiple Vitamins-Minerals (ICAPS AREDS 2 PO) Take 1 capsule by mouth 2 (two) times daily.   Yes [provider]  omeprazole (PRILOSEC) 40 MG capsule Take 1 capsule (40 mg total) by mouth 2 (two) times daily for 14 days. 06/14/21 07/18/2021 Yes Dolores Frame, MD  predniSONE (DELTASONE) 5 MG tablet Take 5 mg by mouth daily. 07/15/21  Yes [provider]  promethazine (PHENERGAN) 25 MG tablet Take 25 mg by mouth every 6 (six) hours as needed for nausea or vomiting.   Yes [provider]  ferrous sulfate 325 (65 FE) MG EC tablet Take 1 tablet (325 mg total) by mouth 2 (two) times daily. Patient not taking: Reported on 07/08/2021 06/03/21 09/01/21  Raquel James, NP     Vital Signs: BP 98/61    Pulse 96    Temp 98.6 F (37 C)    Resp (!) 28    Ht 5\' 9"  (1.753 m)    Wt 218 lb 0.6 oz (98.9 kg)    SpO2 100%    BMI 32.20 kg/m   Physical Exam Vitals reviewed.  Constitutional:      General: He is not in acute distress.    Appearance: He is ill-appearing.  HENT:     Head: Normocephalic and atraumatic.  Eyes:     Pupils: Pupils are equal, round, and reactive to light.     Comments: Pupils 2 mm, symmetric   Cardiovascular:  Comments: DP 1+ R>L  Pulmonary:     Comments: Intubated  Skin:    General: Skin is warm and dry.     Coloration: Skin is not jaundiced or pale.     Comments: Two puncture sites in right groin area. Old blood noted on the quick clot.  CFV: Site clean and soft, no active bleeding noted.  CFA: Site with minimal blood in the tract, no active bleeding noted. Minimal ecchymosis noted at 7, 8, 9 o'clock. Site is hard to touch, no pulse or bruit noted.  DP 2+ R>L   Neurological:     Comments: Sedated      Imaging: CT HEAD WO CONTRAST (5MM)  Result Date: 07/19/2021 CLINICAL DATA:  Follow-up examination for acute stroke, status post  mechanical thrombectomy of left M1 occlusion followed by pulmonary embolism and PE a arrest. EXAM: CT HEAD WITHOUT CONTRAST TECHNIQUE: Contiguous axial images were obtained from the base of the skull through the vertex without intravenous contrast. COMPARISON:  Prior CT from 07/18/2021 FINDINGS: Brain: Continued interval evolution of posterior left MCA distribution infarct involving the left parieto-occipital region, now more apparent as compared to previous exam. Furthermore, now evident are additional multifocal areas of evolving hypodensity involving the left frontal lobe anteriorly (series 3, images 24, 26). Additional scattered hypodensities also noted at the contralateral right frontoparietal region (series 3, image 27, 25). Additional cytotoxic edema now evident at the right occipital lobe as well (series 3, image 18). Patchy hypodensities now seen at the bilateral cerebellar hemispheres as well (series 3, image 10). Findings consistent with additional multifocal ischemic infarcts. Given the various vascular distributions involved, a central thromboembolic etiology could be the causative process. Alternatively, the additional newly seen ischemic changes involving the bilateral cerebral hemispheres are somewhat watershed in distribution, and these findings could reflect a hypoperfusion insult, particularly given patient history. In addition, the deep gray nuclei are somewhat poorly seen and well demarcated since previous exam, most pronounced about the caudate nuclei. Given the history of recent PA arrest, findings are concerning for a superimposed anoxic injury. Multiple scattered superimposed remote lacunar infarcts noted about the deep gray nuclei as well. No acute intracranial hemorrhage. No significant mass effect or midline shift at this time. No visible mass lesion or extra-axial fluid collection. Vascular: No hyperdense vessel. Scattered vascular calcifications about the carotid siphons. Skull: EEG  leads overlie the scalp. Scalp soft tissues demonstrate no other acute finding. Calvarium intact. Sinuses/Orbits: Globes orbital soft tissues demonstrate no acute finding. Scattered mucosal thickening noted within the sphenoid and maxillary sinuses bilaterally. Endotracheal tube partially visualized. Nasogastric 2 partially seen coiled within the oral cavity. Repositioning recommended. Mastoid air cells remain largely clear. Other: None. IMPRESSION: 1. Continued interval evolution of moderate to large posterior left MCA distribution infarct, now more apparent as compared to previous exam. No significant regional mass effect at this time. No intracranial hemorrhage. 2. Interval development of multiple new scattered hypodensities involving the bilateral cerebral and cerebellar hemispheres, consistent with additional multifocal ischemic infarcts. Given the various vascular distributions involved, a central thromboembolic etiology could be the causative process. Alternatively, these newly seen ischemic changes are somewhat watershed in distribution, and could reflect a hypoperfusion injury (or a combination thereof). 3. Subtle hypodensity and blurring of the deep gray nuclei as compared to previous exam, suspicious for superimposed anoxic injury, particularly given the patient history of recent PEA arrest. 4. Nasogastric tube partially seen coiled within the oral cavity. Repositioning recommended. Electronically Signed   By: Sharlet SalinaBenjamin  Jeannine Boga M.D.   On: 07/19/2021 03:31   CT HEAD WO CONTRAST (5MM)  Result Date: 07/18/2021 CLINICAL DATA:  80 year old male status post left MCA M1 occlusion, revascularization. EXAM: CT HEAD WITHOUT CONTRAST TECHNIQUE: Contiguous axial images were obtained from the base of the skull through the vertex without intravenous contrast. COMPARISON:  07/11/2021 and earlier. FINDINGS: Brain: No midline shift, mass effect, or evidence of intracranial mass lesion. No ventriculomegaly. No  acute intracranial hemorrhage identified. However, there is loss of gray-white matter differentiation in the posterior left hemisphere, most apparent on series 5, images 13 and 18. Posterior left MCA territory most affected. Deep gray matter nuclei relatively spared. Gray-white matter differentiation elsewhere appears within normal limits. Vascular: Calcified atherosclerosis at the skull base. Some residual intravascular contrast is suspected. Skull: No acute osseous abnormality identified. Sinuses/Orbits: Stable scattered paranasal sinus fluid and mucosal thickening in the setting of intubation. Tympanic cavities and mastoids remain clear. Other: No acute orbit or scalp soft tissue finding. Intubated. Oral enteric tube visible. IMPRESSION: 1. Early/evolving posterior Left MCA infarct. Probably moderate-sized infarct with subtle cytotoxic edema at this time. No associated hemorrhage or mass effect. 2. Elsewhere stable non contrast CT appearance of the brain. Electronically Signed   By: Genevie Ann M.D.   On: 07/18/2021 06:54   CT HEAD WO CONTRAST (5MM)  Result Date: 07/09/2021 CLINICAL DATA:  Stroke, follow up recent CVA s/p revascularization assess for hemorrhagic conversion. EXAM: CT HEAD WITHOUT CONTRAST TECHNIQUE: Contiguous axial images were obtained from the base of the skull through the vertex without intravenous contrast. COMPARISON:  None. FINDINGS: Brain: Normal anatomic configuration. Parenchymal volume loss is commensurate with the patient's age. Mild periventricular white matter changes are present likely reflecting the sequela of small vessel ischemia. Remote lacunar infarcts are noted within the thalami bilaterally. No abnormal intra or extra-axial mass lesion or fluid collection. No abnormal mass effect or midline shift. No evidence of acute intracranial hemorrhage or infarct. Ventricular size is normal. Cerebellum unremarkable. Vascular: No asymmetric hyperdense vasculature at the skull base.  Skull: Intact Sinuses/Orbits: Mucous retention cyst or polyp within the left maxillary sinus noted. Remaining paranasal sinuses are clear. Ocular lenses have been removed. Orbits are otherwise unremarkable. Other: Mastoid air cells and middle ear cavities are clear. IMPRESSION: No acute intracranial hemorrhage or infarct. Stable remote lacunar infarcts within the thalami bilaterally. Stable senescent change. Electronically Signed   By: Fidela Salisbury M.D.   On: 07/18/2021 22:04   CT Angio Chest Pulmonary Embolism (PE) W or WO Contrast  Result Date: 07/01/2021 CLINICAL DATA:  Hypoxia, recent deep vein thrombosis EXAM: CT ANGIOGRAPHY CHEST WITH CONTRAST TECHNIQUE: Multidetector CT imaging of the chest was performed using the standard protocol during bolus administration of intravenous contrast. Multiplanar CT image reconstructions and MIPs were obtained to evaluate the vascular anatomy. CONTRAST:  13mL OMNIPAQUE IOHEXOL 350 MG/ML SOLN COMPARISON:  11/10/2020 CT chest FINDINGS: Cardiovascular: Large right pulmonary artery saddle embolus extending substantially into the right upper lobe, right middle lobe, and right lower lobe pulmonary arteries, high level of occlusion. Left pulmonary artery saddle embolus extending into the upper lobe and lower lobe pulmonary arteries. Clot burden is heavy. Dilated right ventricle with right ventricular to left ventricular ratio of 1.47. Mild generalized cardiomegaly is also present. Coronary, aortic arch, and branch vessel atherosclerotic vascular disease. Ascending thoracic aorta 4.0 cm in diameter compatible with mild aneurysmal dilatation. Mediastinum/Nodes: Left thyroid nodules including a solid-appearing 2.4 cm left thyroid nodule on image 9 of series 6,  this was previously biopsied on 08/12/2019. This has been evaluated on previous imaging. (ref: J Am Coll Radiol. 2015 Feb;12(2): 143-50).A nasogastric tube extends into the stomach. An endotracheal tube is present with tip  6.3 cm above the carina. Lungs/Pleura: Mild peribronchovascular atelectasis in the right lower lobe. Atelectasis posteriorly in the left upper lobe with complete atelectasis of the left lower lobe. There is some bandlike atelectasis peripherally in the left upper lobe as well. No significant pleural effusion. Upper Abdomen: The nasogastric tube terminates in the antrum. Possible gallbladder wall thickening although this could be from gallbladder contraction. Musculoskeletal: Prior lower thoracic vertebral augmentations. Thoracic spondylosis. Review of the MIP images confirms the above findings. IMPRESSION: 1. Very large acute bilateral pulmonary emboli, with near complete occlusion of the right pulmonary artery and left pulmonary artery saddle emboli extending substantially in the left upper lobe and left lower lobe. Clot burden is heavy. Cardiomegaly especially the right heart indicating right heart strain. 2. Ascending thoracic aorta measures 4.0 cm in diameter, compatible with aneurysm. Recommend annual imaging followup by CTA or MRA. This recommendation follows 2010 ACCF/AHA/AATS/ACR/ASA/SCA/SCAI/SIR/STS/SVM Guidelines for the Diagnosis and Management of Patients with Thoracic Aortic Disease. Circulation. 2010; [id number]. Aortic aneurysm NOS (ICD10-I71.9) 3. Complete atelectasis of the left lower lobe. Mild scattered atelectasis in the left upper lobe and right lower lobe. 4. Gallbladder wall thickening although this could be from gallbladder contraction rather than necessarily indicating inflammation. Critical Value/emergent results were called by telephone at the time of interpretation on 07/25/2021 at 6:35 pm to provider [facility] , who verbally acknowledged these results. Electronically Signed   By: Van Clines M.D.   On: 07/26/2021 18:42   IR Angiogram Pulmonary Bilateral Selective  Result Date: 07/18/2021 INDICATION: 80 year old male with at least intermediate high risk pulmonary  embolism in the setting of known lower extremity DVT and cessation correlation. Status post left middle cerebral artery embolization earlier the same day. EXAM: 1. Ultrasound-guided vascular access of the right common femoral vein. 2. Pulmonary angiography. 3. Pulmonary manometry. 4. Aspiration thrombectomy of the bilateral pulmonary arteries. 5. Ultrasound-guided vascular access of the right common femoral artery. COMPARISON:  CTAnmot  chest from 06/29/2021 MEDICATIONS: None. ANESTHESIA/SEDATION: General anesthesia. FLUOROSCOPY TIME:  Fluoroscopy Time: 40 minutes 0 seconds (381.7 mGy). COMPLICATIONS: SIR LEVEL D - Requires major therapy, prolonged hospitalization (>48 hours). After bilateral aspiration, the patient experienced bradycardia and profound hypoxia lead cardiac arrest. ACLS was run in code was called with ROSC x2. Systemic tPA was administered. The patient is stabilized well enough to return to the ICU. TECHNIQUE: Informed written consent was obtained from the patient after a thorough discussion of the procedural risks, benefits and alternatives. All questions were addressed. Maximal Sterile Barrier Technique was utilized including caps, mask, sterile gowns, sterile gloves, sterile drape, hand hygiene and skin antiseptic. A timeout was performed prior to the initiation of the procedure. The right groin was prepped and draped in standard fashion. Preprocedure ultrasound evaluation demonstrated patency of the right common femoral vein. The procedure was planned. A small skin nick was made. Under direct ultrasound visualization, the right common femoral vein was accessed with a 21 gauge micropuncture needle. A permanent image was captured and stored in the record. A micropuncture sheath was introduced through which a Wholey wire was inserted into the inferior vena cava. An 8 [place] vascular sheath was then placed in preparation for Perclose suture deployment x2. Serial dilation was then performed and a 12  French, 45 cm dry seal sheath  was placed and the tip was positioned in the perihepatic inferior vena cava. Through the indwelling sheath, an angled 5 French pigtail catheter was inserted and directed into the main pulmonary artery. Pulmonary manometry was performed in pressures were measured as 58/22 (mean 35) mm Hg compatible with pulmonary hypertension. Pulmonary angiogram was then performed which demonstrated distal right main pulmonary artery large pulmonary embolism resulting in severely diminished flow to the right upper lobe and small left distal main pulmonary pulmonary embolism resulting in diminished flow to the left lower lobe. The pigtail catheter was exchanged over a short taper stiff Amplatz wire for a 5 [place] Select catheter which was then directed to the left main pulmonary artery, the catheter was then removed and inserted coaxially through a Lightning 12 Penumbra aspiration catheter which was then directed into the left main pulmonary artery. The 5 French catheter was removed and aspiration thrombectomy was then performed over the Amplatz wire into the proximal left main pulmonary artery. There is minimal aspirate in the aspiration canister. The catheter was retracted into the proximal main left pulmonary artery and left pulmonary angiogram was then performed which demonstrated improved patency and perfusion throughout the left lung with probable persistent small thrombus burden in the left inferior lobar branch, nonocclusive. The right main pulmonary artery was then attempted to be selected. The 12 French aspiration catheter was unable to select the right pulmonary artery independently over the Amplatz wire. Therefore the aspiration catheter was removed and with multiple attempts, access to the right pulmonary artery was lost with several combinations of catheters and wires including short taper stiff Amplatz, Wholey wire, 5 French pigtail catheter common 5 [place] select catheter. The right  inferior main pulmonary artery was then selected with the short taper stiff Amplatz Glidewire and again in coaxial fashion the 5 [place] select catheter was placed through the 12 L and successfully positioned in the right main pulmonary artery. Aspiration thrombectomy was performed yielded small clot burden. Minimal manipulation of the aspiration catheter resulted in loss of access of the right main pulmonary artery due to significant tortuosity of the right heart and pulmonary artery vasculature. The patient then experience bradycardia and worsening hypoxia. The pigtail catheter was repositioned in the main pulmonary artery and pulmonary angiogram was repeated. There is persistent small volume thrombus burden in the distal left main pulmonary artery and persistent moderate but improved thrombus burden in the distal right main pulmonary artery extending into the upper and middle lobar branches. Pulmonary manometry was again performed which with a mean main pulmonary artery pressure of 40 mm Hg. At this point, the patient continued to decompensate from hemodynamic standpoint leading to cardiac arrest. The catheters were removed from the right heart. Chest compressions were were initiated immediately and ACLS algorithm was run in concert with Anesthesia and the hospital code team. A right common femoral artery sheath was then placed under ultrasound guidance for arterial blood pressure monitoring in this acute setting as per request by anesthesia. After return of spontaneous circulation, discussion with the ICU and pharmacy teams was had and decision to administer systemic tPA as the etiology of decompensation was highly suspected to be related to thrombus burden/pulmonary embolism. The patient stabilized well enough to be transferred back to the ICU. Sterile bandages were applied over the right groin site. The 68 French vascular sheath remain in the right groin. The patient's status and updates were delivered directly  to the family who was then ulcer to the bedside to spend time with  the patient. FINDINGS: Acute bilateral pulmonary embolism, right greater than left. Marginally successful aspiration thrombectomy without large volume thrombus retrieval. No significant prove mint in pulmonary artery pressure after aspiration thrombectomy. The patient unexpectedly experienced cardiopulmonary decompensation requiring cardiopulmonary resuscitation. IMPRESSION: 1. Acute bilateral pulmonary emboli with pulmonary artery hypertension. 2. Marginally successful aspiration thrombectomy of the bilateral pulmonary arteries small volume thrombus yield. 3. The procedure was terminated due to acute, life-threatening cardiopulmonary decompensation in the patient was transferred back to the ICU in critical condition. PLAN: Pending clinical outcome, the right groin sheaths may be removed by Interventional Radiology upon request. Ruthann Cancer, MD [facility] Electronically Signed   By: Ruthann Cancer M.D.   On: 07/18/2021 10:00   IR THROMBECT PRIM MECH INIT (INCLU) MOD SED  Result Date: 07/18/2021 INDICATION: 80 year old male with at least intermediate high risk pulmonary embolism in the setting of known lower extremity DVT and cessation correlation. Status post left middle cerebral artery embolization earlier the same day. EXAM: 1. Ultrasound-guided vascular access of the right common femoral vein. 2. Pulmonary angiography. 3. Pulmonary manometry. 4. Aspiration thrombectomy of the bilateral pulmonary arteries. 5. Ultrasound-guided vascular access of the right common femoral artery. COMPARISON:  CTAnmot  chest from 07/08/2021 MEDICATIONS: None. ANESTHESIA/SEDATION: General anesthesia. FLUOROSCOPY TIME:  Fluoroscopy Time: 40 minutes 0 seconds (381.7 mGy). COMPLICATIONS: SIR LEVEL D - Requires major therapy, prolonged hospitalization (>48 hours). After bilateral aspiration, the patient  experienced bradycardia and profound hypoxia lead cardiac arrest. ACLS was run in code was called with ROSC x2. Systemic tPA was administered. The patient is stabilized well enough to return to the ICU. TECHNIQUE: Informed written consent was obtained from the patient after a thorough discussion of the procedural risks, benefits and alternatives. All questions were addressed. Maximal Sterile Barrier Technique was utilized including caps, mask, sterile gowns, sterile gloves, sterile drape, hand hygiene and skin antiseptic. A timeout was performed prior to the initiation of the procedure. The right groin was prepped and draped in standard fashion. Preprocedure ultrasound evaluation demonstrated patency of the right common femoral vein. The procedure was planned. A small skin nick was made. Under direct ultrasound visualization, the right common femoral vein was accessed with a 21 gauge micropuncture needle. A permanent image was captured and stored in the record. A micropuncture sheath was introduced through which a Wholey wire was inserted into the inferior vena cava. An 8 [place] vascular sheath was then placed in preparation for Perclose suture deployment x2. Serial dilation was then performed and a 12 French, 45 cm dry seal sheath was placed and the tip was positioned in the perihepatic inferior vena cava. Through the indwelling sheath, an angled 5 French pigtail catheter was inserted and directed into the main pulmonary artery. Pulmonary manometry was performed in pressures were measured as 58/22 (mean 35) mm Hg compatible with pulmonary hypertension. Pulmonary angiogram was then performed which demonstrated distal right main pulmonary artery large pulmonary embolism resulting in severely diminished flow to the right upper lobe and small left distal main pulmonary pulmonary embolism resulting in diminished flow to the left lower lobe. The pigtail catheter was exchanged over a short taper stiff Amplatz wire for a 5  [place] Select catheter which was then directed to the left main pulmonary artery, the catheter was then removed and inserted coaxially through a Lightning 12 Penumbra aspiration catheter which was then directed into the left main pulmonary artery. The 5 French catheter was removed and aspiration thrombectomy was then performed  over the Amplatz wire into the proximal left main pulmonary artery. There is minimal aspirate in the aspiration canister. The catheter was retracted into the proximal main left pulmonary artery and left pulmonary angiogram was then performed which demonstrated improved patency and perfusion throughout the left lung with probable persistent small thrombus burden in the left inferior lobar branch, nonocclusive. The right main pulmonary artery was then attempted to be selected. The 12 French aspiration catheter was unable to select the right pulmonary artery independently over the Amplatz wire. Therefore the aspiration catheter was removed and with multiple attempts, access to the right pulmonary artery was lost with several combinations of catheters and wires including short taper stiff Amplatz, Wholey wire, 5 French pigtail catheter common 5 [place] select catheter. The right inferior main pulmonary artery was then selected with the short taper stiff Amplatz Glidewire and again in coaxial fashion the 5 [place] select catheter was placed through the 12 L and successfully positioned in the right main pulmonary artery. Aspiration thrombectomy was performed yielded small clot burden. Minimal manipulation of the aspiration catheter resulted in loss of access of the right main pulmonary artery due to significant tortuosity of the right heart and pulmonary artery vasculature. The patient then experience bradycardia and worsening hypoxia. The pigtail catheter was repositioned in the main pulmonary artery and pulmonary angiogram was repeated. There is persistent small volume thrombus burden in the distal  left main pulmonary artery and persistent moderate but improved thrombus burden in the distal right main pulmonary artery extending into the upper and middle lobar branches. Pulmonary manometry was again performed which with a mean main pulmonary artery pressure of 40 mm Hg. At this point, the patient continued to decompensate from hemodynamic standpoint leading to cardiac arrest. The catheters were removed from the right heart. Chest compressions were were initiated immediately and ACLS algorithm was run in concert with Anesthesia and the hospital code team. A right common femoral artery sheath was then placed under ultrasound guidance for arterial blood pressure monitoring in this acute setting as per request by anesthesia. After return of spontaneous circulation, discussion with the ICU and pharmacy teams was had and decision to administer systemic tPA as the etiology of decompensation was highly suspected to be related to thrombus burden/pulmonary embolism. The patient stabilized well enough to be transferred back to the ICU. Sterile bandages were applied over the right groin site. The 50 French vascular sheath remain in the right groin. The patient's status and updates were delivered directly to the family who was then ulcer to the bedside to spend time with the patient. FINDINGS: Acute bilateral pulmonary embolism, right greater than left. Marginally successful aspiration thrombectomy without large volume thrombus retrieval. No significant prove mint in pulmonary artery pressure after aspiration thrombectomy. The patient unexpectedly experienced cardiopulmonary decompensation requiring cardiopulmonary resuscitation. IMPRESSION: 1. Acute bilateral pulmonary emboli with pulmonary artery hypertension. 2. Marginally successful aspiration thrombectomy of the bilateral pulmonary arteries small volume thrombus yield. 3. The procedure was terminated due to acute, life-threatening cardiopulmonary decompensation in the  patient was transferred back to the ICU in critical condition. PLAN: Pending clinical outcome, the right groin sheaths may be removed by Interventional Radiology upon request. Ruthann Cancer, MD [facility] Radiology Electronically Signed   By: Ruthann Cancer M.D.   On: 07/18/2021 10:00   IR CT Head Ltd  Result Date: 07/19/2021 CLINICAL DATA:  Onset of left gaze deviation, right sided hemiplegia, and global aphasia. Occluded left middle cerebral artery M1  segment on CT angiogram of the head and neck. EXAM: IR PERCUTANEOUS ART THORMBECTOMY/INFUSION INTRACRANIAL INCLUDE DIAG ANGIO COMPARISON:  CT angiogram of the head and neck of July 17, 2021. MEDICATIONS: Heparin no units IV. Ancef 2 g IV antibiotic was administered within 1 hour of the procedure. ANESTHESIA/SEDATION: General anesthesia. CONTRAST:  Omnipaque 300 approximately 50 mL. FLUOROSCOPY TIME:  Fluoroscopy Time: 50 minutes 36 seconds (1110 mGy). COMPLICATIONS: None immediate. TECHNIQUE: Informed written consent was obtained from the patient after a thorough discussion of the procedural risks, benefits and alternatives. All questions were addressed. Maximal Sterile Barrier Technique was utilized including caps, mask, sterile gowns, sterile gloves, sterile drape, hand hygiene and skin antiseptic. A timeout was performed prior to the initiation of the procedure. The right groin was prepped and draped in the usual sterile fashion. Thereafter using modified Seldinger technique, transfemoral access into the right common femoral artery was obtained without difficulty. Over a 0.035 inch guidewire, an 8 French 25 Pinnacle sheath was inserted. Through this, and also over 0.035 inch guidewire, combination of a 5.5 [place] Simmons 2 support catheter inside of an 087 95 cm balloon guide catheter which had been prepped with 50% heparin, and 50% heparinized saline infusion was then advanced and selectively positioned in  the left common carotid artery. The arteriogram was then performed centered extra cranially and intracranially which demonstrated the left external carotid artery and its major branches to be widely patent. The left internal carotid artery at the bulb to the cranial skull base demonstrates wide patency. The petrous, the cavernous and the supraclinoid segments opacify widely. A left posterior communicating artery is seen opacifying the left posterior cerebral distribution. The left anterior cerebral artery opacifies into the capillary and venous phases. The left middle cerebral artery demonstrates the superior division to be widely patent. Inferior division in the M3 region demonstrates complete occlusion with a large area of hypoperfusion in the left parietal lobe on the delayed arterial and capillary phases. Thereafter, over an 035 inch regular Glidewire, the combination of the support catheter, and the balloon guide was then advanced to the mid left cervical ICA. The guidewire was removed. Good aspiration obtained from the hub of the balloon guide catheter. A gentle control arteriogram performed through the balloon guide catheter demonstrated no evidence of intraluminal filling defects or of vasospasm or dissections. Over a 0.014 inch standard Synchro micro guidewire with a moderate J configuration an 021 Phenom 160 cm microcatheter inside of an 071 132 cm Zoom aspiration catheter combination, was then advanced to the proximal left middle cerebral artery. Using a torque device the micro guidewire was then advanced gently through the occluded inferior division into the proximal M3 segment followed by the microcatheter. The guidewire was removed. Good aspiration was obtained from the hub of the microcatheter. A gentle control arteriogram performed through the microcatheter demonstrated safe positioning of the tip of the microcatheter with antegrade flow. This was connected to continuous heparinized saline infusion.  A 4 mm x 40 mm Solitaire X retrieval device was then advanced to the distal end of the microcatheter. The microcatheter was then retrieved unsheathing the retrieval device such that the proximal portion of the retrieval device was in the proximal segment of the occlusion. At this time, the Zoom aspiration catheter was advanced and positioned just inside the origin of the inferior division. With proximal flow arrest in the proximal left internal carotid artery, and constant aspiration applied at the hub of the balloon guide catheter with a 20 mL syringe,  and a Penumbra aspiration device at the hub of the Zoom aspiration catheter for approximately 3 minutes, the combination of the retrieval device, the microcatheter, and the Zoom aspiration catheter were retrieved and removed. Proximal flow arrest was then reversed. A control arteriogram performed through the balloon guide catheter in the mid left internal carotid artery now demonstrated complete revascularization of the previously occluded inferior division. The superior division remained widely patent. A TICI 3 revascularization of the left MCA territory was evidenced without any spasm. The left anterior cerebral artery opacified into the capillary and venous phases. Balloon guide was then retrieved into the left common carotid artery and control arteriogram performed through this demonstrated the left internal carotid artery to be widely patent extra cranially and intracranially. The balloon guide catheter was removed. The 8 French Pinnacle sheath was then removed with successful hemostasis at the right groin puncture site with a 6 French Angio-Seal closure device, and manual compression with quick clot for approximately 15 minutes. CT of the brain demonstrated no evidence of intracranial hemorrhage or mass effect. Distal pulses remained Dopplerable in both feet unchanged. Patient was left intubated due to patient not being able to support oxygen concentrations  following reversal. Patient was then transferred to the neuro ICU for post thrombectomy management. FINDINGS: As above. IMPRESSION: Status post endovascular complete revascularization of left middle cerebral artery distribution as described using a single pass with a 4 mm x 40 mm Solitaire X retrieval device, and contact aspiration achieving a TICI 3 revascularization. PLAN: As per referring MD. Electronically Signed   By: Luanne Bras M.D.   On: 07/19/2021 08:20   IR US Guide Vasc Access Right  Result Date: 07/18/2021 INDICATION: 80 year old male with at least intermediate high risk pulmonary embolism in the setting of known lower extremity DVT and cessation correlation. Status post left middle cerebral artery embolization earlier the same day. EXAM: 1. Ultrasound-guided vascular access of the right common femoral vein. 2. Pulmonary angiography. 3. Pulmonary manometry. 4. Aspiration thrombectomy of the bilateral pulmonary arteries. 5. Ultrasound-guided vascular access of the right common femoral artery. COMPARISON:  CTAnmot  chest from 07/06/2021 MEDICATIONS: None. ANESTHESIA/SEDATION: General anesthesia. FLUOROSCOPY TIME:  Fluoroscopy Time: 40 minutes 0 seconds (381.7 mGy). COMPLICATIONS: SIR LEVEL D - Requires major therapy, prolonged hospitalization (>48 hours). After bilateral aspiration, the patient experienced bradycardia and profound hypoxia lead cardiac arrest. ACLS was run in code was called with ROSC x2. Systemic tPA was administered. The patient is stabilized well enough to return to the ICU. TECHNIQUE: Informed written consent was obtained from the patient after a thorough discussion of the procedural risks, benefits and alternatives. All questions were addressed. Maximal Sterile Barrier Technique was utilized including caps, mask, sterile gowns, sterile gloves, sterile drape, hand hygiene and skin antiseptic. A timeout was performed prior to the initiation of the procedure. The right groin  was prepped and draped in standard fashion. Preprocedure ultrasound evaluation demonstrated patency of the right common femoral vein. The procedure was planned. A small skin nick was made. Under direct ultrasound visualization, the right common femoral vein was accessed with a 21 gauge micropuncture needle. A permanent image was captured and stored in the record. A micropuncture sheath was introduced through which a Wholey wire was inserted into the inferior vena cava. An 8 [place] vascular sheath was then placed in preparation for Perclose suture deployment x2. Serial dilation was then performed and a 12 French, 45 cm dry seal sheath was placed and the tip was positioned in the  perihepatic inferior vena cava. Through the indwelling sheath, an angled 5 French pigtail catheter was inserted and directed into the main pulmonary artery. Pulmonary manometry was performed in pressures were measured as 58/22 (mean 35) mm Hg compatible with pulmonary hypertension. Pulmonary angiogram was then performed which demonstrated distal right main pulmonary artery large pulmonary embolism resulting in severely diminished flow to the right upper lobe and small left distal main pulmonary pulmonary embolism resulting in diminished flow to the left lower lobe. The pigtail catheter was exchanged over a short taper stiff Amplatz wire for a 5 [place] Select catheter which was then directed to the left main pulmonary artery, the catheter was then removed and inserted coaxially through a Lightning 12 Penumbra aspiration catheter which was then directed into the left main pulmonary artery. The 5 French catheter was removed and aspiration thrombectomy was then performed over the Amplatz wire into the proximal left main pulmonary artery. There is minimal aspirate in the aspiration canister. The catheter was retracted into the proximal main left pulmonary artery and left pulmonary angiogram was then performed which demonstrated improved patency  and perfusion throughout the left lung with probable persistent small thrombus burden in the left inferior lobar branch, nonocclusive. The right main pulmonary artery was then attempted to be selected. The 12 French aspiration catheter was unable to select the right pulmonary artery independently over the Amplatz wire. Therefore the aspiration catheter was removed and with multiple attempts, access to the right pulmonary artery was lost with several combinations of catheters and wires including short taper stiff Amplatz, Wholey wire, 5 French pigtail catheter common 5 [place] select catheter. The right inferior main pulmonary artery was then selected with the short taper stiff Amplatz Glidewire and again in coaxial fashion the 5 [place] select catheter was placed through the 12 L and successfully positioned in the right main pulmonary artery. Aspiration thrombectomy was performed yielded small clot burden. Minimal manipulation of the aspiration catheter resulted in loss of access of the right main pulmonary artery due to significant tortuosity of the right heart and pulmonary artery vasculature. The patient then experience bradycardia and worsening hypoxia. The pigtail catheter was repositioned in the main pulmonary artery and pulmonary angiogram was repeated. There is persistent small volume thrombus burden in the distal left main pulmonary artery and persistent moderate but improved thrombus burden in the distal right main pulmonary artery extending into the upper and middle lobar branches. Pulmonary manometry was again performed which with a mean main pulmonary artery pressure of 40 mm Hg. At this point, the patient continued to decompensate from hemodynamic standpoint leading to cardiac arrest. The catheters were removed from the right heart. Chest compressions were were initiated immediately and ACLS algorithm was run in concert with Anesthesia and the hospital code team. A right common femoral artery sheath was  then placed under ultrasound guidance for arterial blood pressure monitoring in this acute setting as per request by anesthesia. After return of spontaneous circulation, discussion with the ICU and pharmacy teams was had and decision to administer systemic tPA as the etiology of decompensation was highly suspected to be related to thrombus burden/pulmonary embolism. The patient stabilized well enough to be transferred back to the ICU. Sterile bandages were applied over the right groin site. The 63 French vascular sheath remain in the right groin. The patient's status and updates were delivered directly to the family who was then ulcer to the bedside to spend time with the patient. FINDINGS: Acute bilateral pulmonary embolism, right greater  than left. Marginally successful aspiration thrombectomy without large volume thrombus retrieval. No significant prove mint in pulmonary artery pressure after aspiration thrombectomy. The patient unexpectedly experienced cardiopulmonary decompensation requiring cardiopulmonary resuscitation. IMPRESSION: 1. Acute bilateral pulmonary emboli with pulmonary artery hypertension. 2. Marginally successful aspiration thrombectomy of the bilateral pulmonary arteries small volume thrombus yield. 3. The procedure was terminated due to acute, life-threatening cardiopulmonary decompensation in the patient was transferred back to the ICU in critical condition. PLAN: Pending clinical outcome, the right groin sheaths may be removed by Interventional Radiology upon request. Ruthann Cancer, MD [facility]ascular and Interventional Radiology Specialists [facility] Radiology Electronically Signed   By: Ruthann Cancer M.D.   On: 07/18/2021 10:00   IR US Guide Vasc Access Right  Result Date: 07/18/2021 INDICATION: 80 year old male with at least intermediate high risk pulmonary embolism in the setting of known lower extremity DVT and cessation correlation. Status post left middle cerebral artery embolization  earlier the same day. EXAM: 1. Ultrasound-guided vascular access of the right common femoral vein. 2. Pulmonary angiography. 3. Pulmonary manometry. 4. Aspiration thrombectomy of the bilateral pulmonary arteries. 5. Ultrasound-guided vascular access of the right common femoral artery. COMPARISON:  CTAnmot  chest from 07/04/2021 MEDICATIONS: None. ANESTHESIA/SEDATION: General anesthesia. FLUOROSCOPY TIME:  Fluoroscopy Time: 40 minutes 0 seconds (381.7 mGy). COMPLICATIONS: SIR LEVEL D - Requires major therapy, prolonged hospitalization (>48 hours). After bilateral aspiration, the patient experienced bradycardia and profound hypoxia lead cardiac arrest. ACLS was run in code was called with ROSC x2. Systemic tPA was administered. The patient is stabilized well enough to return to the ICU. TECHNIQUE: Informed written consent was obtained from the patient after a thorough discussion of the procedural risks, benefits and alternatives. All questions were addressed. Maximal Sterile Barrier Technique was utilized including caps, mask, sterile gowns, sterile gloves, sterile drape, hand hygiene and skin antiseptic. A timeout was performed prior to the initiation of the procedure. The right groin was prepped and draped in standard fashion. Preprocedure ultrasound evaluation demonstrated patency of the right common femoral vein. The procedure was planned. A small skin nick was made. Under direct ultrasound visualization, the right common femoral vein was accessed with a 21 gauge micropuncture needle. A permanent image was captured and stored in the record. A micropuncture sheath was introduced through which a Wholey wire was inserted into the inferior vena cava. An 8 [place] vascular sheath was then placed in preparation for Perclose suture deployment x2. Serial dilation was then performed and a 12 French, 45 cm dry seal sheath was placed and the tip was positioned in the perihepatic inferior vena cava. Through the indwelling  sheath, an angled 5 French pigtail catheter was inserted and directed into the main pulmonary artery. Pulmonary manometry was performed in pressures were measured as 58/22 (mean 35) mm Hg compatible with pulmonary hypertension. Pulmonary angiogram was then performed which demonstrated distal right main pulmonary artery large pulmonary embolism resulting in severely diminished flow to the right upper lobe and small left distal main pulmonary pulmonary embolism resulting in diminished flow to the left lower lobe. The pigtail catheter was exchanged over a short taper stiff Amplatz wire for a 5 [place] Select catheter which was then directed to the left main pulmonary artery, the catheter was then removed and inserted coaxially through a Lightning 12 Penumbra aspiration catheter which was then directed into the left main pulmonary artery. The 5 French catheter was removed and aspiration thrombectomy was then performed over the Amplatz wire into the proximal left main pulmonary artery.  There is minimal aspirate in the aspiration canister. The catheter was retracted into the proximal main left pulmonary artery and left pulmonary angiogram was then performed which demonstrated improved patency and perfusion throughout the left lung with probable persistent small thrombus burden in the left inferior lobar branch, nonocclusive. The right main pulmonary artery was then attempted to be selected. The 12 French aspiration catheter was unable to select the right pulmonary artery independently over the Amplatz wire. Therefore the aspiration catheter was removed and with multiple attempts, access to the right pulmonary artery was lost with several combinations of catheters and wires including short taper stiff Amplatz, Wholey wire, 5 French pigtail catheter common 5 [place] select catheter. The right inferior main pulmonary artery was then selected with the short taper stiff Amplatz Glidewire and again in coaxial fashion the 5 [place]  select catheter was placed through the 12 L and successfully positioned in the right main pulmonary artery. Aspiration thrombectomy was performed yielded small clot burden. Minimal manipulation of the aspiration catheter resulted in loss of access of the right main pulmonary artery due to significant tortuosity of the right heart and pulmonary artery vasculature. The patient then experience bradycardia and worsening hypoxia. The pigtail catheter was repositioned in the main pulmonary artery and pulmonary angiogram was repeated. There is persistent small volume thrombus burden in the distal left main pulmonary artery and persistent moderate but improved thrombus burden in the distal right main pulmonary artery extending into the upper and middle lobar branches. Pulmonary manometry was again performed which with a mean main pulmonary artery pressure of 40 mm Hg. At this point, the patient continued to decompensate from hemodynamic standpoint leading to cardiac arrest. The catheters were removed from the right heart. Chest compressions were were initiated immediately and ACLS algorithm was run in concert with Anesthesia and the hospital code team. A right common femoral artery sheath was then placed under ultrasound guidance for arterial blood pressure monitoring in this acute setting as per request by anesthesia. After return of spontaneous circulation, discussion with the ICU and pharmacy teams was had and decision to administer systemic tPA as the etiology of decompensation was highly suspected to be related to thrombus burden/pulmonary embolism. The patient stabilized well enough to be transferred back to the ICU. Sterile bandages were applied over the right groin site. The 94 French vascular sheath remain in the right groin. The patient's status and updates were delivered directly to the family who was then ulcer to the bedside to spend time with the patient. FINDINGS: Acute bilateral pulmonary embolism, right  greater than left. Marginally successful aspiration thrombectomy without large volume thrombus retrieval. No significant prove mint in pulmonary artery pressure after aspiration thrombectomy. The patient unexpectedly experienced cardiopulmonary decompensation requiring cardiopulmonary resuscitation. IMPRESSION: 1. Acute bilateral pulmonary emboli with pulmonary artery hypertension. 2. Marginally successful aspiration thrombectomy of the bilateral pulmonary arteries small volume thrombus yield. 3. The procedure was terminated due to acute, life-threatening cardiopulmonary decompensation in the patient was transferred back to the ICU in critical condition. PLAN: Pending clinical outcome, the right groin sheaths may be removed by Interventional Radiology upon request. Ruthann Cancer, MD Vascular and Interventional Radiology Specialists [facility] Radiology Electronically Signed   By: Ruthann Cancer M.D.   On: 07/18/2021 10:00   DG CHEST PORT 1 VIEW  Result Date: 07/18/2021 CLINICAL DATA:  Encounter for intubation EXAM: PORTABLE CHEST 1 VIEW COMPARISON:  Radiograph performed earlier on the same date FINDINGS: Interval adjustment of the endotracheal tube now distal tip  approximately 5 cm above the carina. Feeding tube coursing below the diaphragm with distal tip not included. Cardiomediastinal silhouette is unchanged. Low lung volumes with left basilar atelectasis, unchanged. IMPRESSION: Interval repositioning of the endotracheal tube now in satisfactory position. No other significant interval change. Electronically Signed   By: Keane Police D.O.   On: 07/18/2021 11:06   DG CHEST PORT 1 VIEW  Result Date: 07/18/2021 CLINICAL DATA:  Endotracheal tube present EXAM: PORTABLE CHEST 1 VIEW COMPARISON:  CT chest dated July 17, 2021. FINDINGS: The heart is normal in size. Low lung volumes with left basilar atelectasis. There is also atelectasis of the medial aspect of the right lower lobe. Endotracheal tube with  distal tip approximately 11.7 cm above the carina. IMPRESSION: 1. Endotracheal tube with distal tip approximately 11 cm above the carina, it need to be advanced about 5 cm. 2. Low lung volumes with bibasilar atelectasis, left greater than the right. Electronically Signed   By: Keane Police D.O.   On: 07/18/2021 08:51   DG CHEST PORT 1 VIEW  Result Date: 07/24/2021 CLINICAL DATA:  Intubation EXAM: PORTABLE CHEST 1 VIEW COMPARISON:  Portable exam 1620 hours compared to 06/26/2021 FINDINGS: Tip of endotracheal tube projects 4.6 cm above carina. Nasogastric tube extends into stomach. Normal heart size, mediastinal contours, and pulmonary vascularity. Atelectasis of LEFT lower lobe. Subsegmental atelectasis RIGHT base. Upper lungs clear. No definite pleural effusion or pneumothorax. IMPRESSION: Bibasilar atelectasis greater on LEFT. Electronically Signed   By: Lavonia Dana M.D.   On: 07/23/2021 16:32   DG Abd Portable 1V  Result Date: 07/19/2021 CLINICAL DATA:  Advanced OG tube EXAM: PORTABLE ABDOMEN - 1 VIEW COMPARISON:  None. FINDINGS: The enteric catheter is coiled in the stomach with the tip projecting over the region of the pylorus/first portion of the duodenum. There is a nonobstructive bowel gas pattern. There is mild gaseous distention of distal large bowel loops, nonspecific. There is no abnormal soft tissue calcification. Post kyphoplasty changes are noted. IMPRESSION: Enteric catheter coiled in the stomach with the tip projecting over the region of the pylorus/first portion of the duodenum. Electronically Signed   By: Valetta Mole M.D.   On: 07/19/2021 08:30   EEG adult  Result Date: 07/19/2021 Lora Havens, MD     07/19/2021  8:47 AM Patient Name: Jaime Whitaker MRN: [id number] Epilepsy Attending: Lora Havens Referring Physician/Provider: Dr Maryjane Hurter, Date: 07/18/2021 Duration: 21.54 mins Patient history: 80 year old male with left M1 occlusion status post thrombectomy and PEA  arrest due to PEA.  EEG for seizure. Level of alertness:  comatose AEDs during EEG study: Propofol Technical aspects: This EEG study was done with scalp electrodes positioned according to the 10-20 International system of electrode placement. Electrical activity was acquired at a sampling rate of 500Hz  and reviewed with a high frequency filter of 70Hz  and a low frequency filter of 1Hz . EEG data were recorded continuously and digitally stored. Description: EEG showed continuous generalized and lateralized left hemisphere 3 to 6 Hz theta- delta slowing. Hyperventilation and photic stimulation were not performed.   ABNORMALITY - Continuous slow, generalized and lateralized left hemisphere IMPRESSION: This study is suggestive of cortical dysfunction in left hemisphere likely due to underlying stroke. Additionally there is moderate to severe degree of encephalopathy, nonspecific etiology.  No seizures or epileptiform discharges were seen throughout the recording. Priyanka Barbra Sarks   Overnight EEG with video  Result Date: 07/19/2021 Lora Havens, MD  07/19/2021  8:53 AM Patient Name: KASPER GALLUCCI MRN: [id number] Epilepsy Attending: Lora Havens Referring Physician/Provider: Dr Maryjane Hurter, Duration: 07/18/2021 1755 to 07/19/2021 0900  Patient history: 80 year old male with left M1 occlusion status post thrombectomy and PEA arrest due to PEA.  EEG for seizure.  Level of alertness:  comatose  AEDs during EEG study: Propofol  Technical aspects: This EEG study was done with scalp electrodes positioned according to the 10-20 International system of electrode placement. Electrical activity was acquired at a sampling rate of 500Hz  and reviewed with a high frequency filter of 70Hz  and a low frequency filter of 1Hz . EEG data were recorded continuously and digitally stored.  Description: EEG showed continuous generalized and lateralized left hemisphere 3 to 6 Hz theta- delta slowing. Hyperventilation and  photic stimulation were not performed.    ABNORMALITY - Continuous slow, generalized and lateralized left hemisphere  IMPRESSION: This study is suggestive of cortical dysfunction in left hemisphere likely due to underlying stroke. Additionally there is moderate to severe degree of encephalopathy, nonspecific etiology.  No seizures or epileptiform discharges were seen throughout the recording.  Lora Havens   ECHOCARDIOGRAM COMPLETE BUBBLE STUDY  Result Date: 07/18/2021    ECHOCARDIOGRAM REPORT   Patient Name:   TALIB JOHANNESSEN Date of Exam: 07/18/2021 Medical Rec #:  [id number]      Height:       69.0 in Accession #:    [id number]     Weight:       218.0 lb Date of Birth:  10-13-1940     BSA:          2.143 m Patient Age:    57 years       BP:           138/65 mmHg Patient Gender: M              HR:           120 bpm. Exam Location:  Inpatient Procedure: 2D Echo, Cardiac Doppler, Color Doppler and Intracardiac            Opacification Agent Indications:    Stroke  History:        Patient has no prior history of Echocardiogram examinations.                 Arrythmias:Tachycardia. DVT with PE. Respiratory failure                 requiring mechanical ventilation. Cardiac arrest.  Sonographer:    Merrie Roof RDCS Referring Phys: [id number] [facility] MEIER  Sonographer Comments: Technically difficult study due to poor echo windows, suboptimal parasternal window, suboptimal apical window, suboptimal subcostal window and echo performed with patient supine and on artificial respirator. The bubble was not done due to poor image quality that would lead to nondiagnostic bubble study. IMPRESSIONS  1. Left ventricular ejection fraction, by estimation, is 60 to 65%. The left ventricle has normal function. The left ventricle has no regional wall motion abnormalities. There is moderate concentric left ventricular hypertrophy. Left ventricular diastolic function could not be evaluated.  2. Right ventricular systolic function  is moderately reduced. The right ventricular size is severely enlarged.  3. The mitral valve is grossly normal. Trivial mitral valve regurgitation. No evidence of mitral stenosis.  4. The aortic valve is grossly normal. There is mild calcification of the aortic valve. There is mild thickening of the aortic valve. Aortic valve regurgitation is trivial.  5. Aortic  dilatation noted. There is mild dilatation of the ascending aorta, measuring 40 mm. Comparison(s): No prior Echocardiogram. Conclusion(s)/Recommendation(s): RV is dilated with reduced function, consistent with history of bilateral PE. FINDINGS  Left Ventricle: Left ventricular ejection fraction, by estimation, is 60 to 65%. The left ventricle has normal function. The left ventricle has no regional wall motion abnormalities. Definity contrast agent was given IV to delineate the left ventricular  endocardial borders. The left ventricular internal cavity size was small. There is moderate concentric left ventricular hypertrophy. Left ventricular diastolic function could not be evaluated. Right Ventricle: The right ventricular size is severely enlarged. Right vetricular wall thickness was not well visualized. Right ventricular systolic function is moderately reduced. Left Atrium: Left atrial size was not well visualized. Right Atrium: Right atrial size was not well visualized. Pericardium: There is no evidence of pericardial effusion. Presence of epicardial fat layer. Mitral Valve: The mitral valve is grossly normal. Trivial mitral valve regurgitation. No evidence of mitral valve stenosis. Tricuspid Valve: The tricuspid valve is grossly normal. Tricuspid valve regurgitation is trivial. No evidence of tricuspid stenosis. Aortic Valve: The aortic valve is grossly normal. There is mild calcification of the aortic valve. There is mild thickening of the aortic valve. Aortic valve regurgitation is trivial. Pulmonic Valve: The pulmonic valve was not well visualized.  Pulmonic valve regurgitation is mild. No evidence of pulmonic stenosis. Aorta: Aortic dilatation noted. There is mild dilatation of the ascending aorta, measuring 40 mm. Venous: IVC assessment for right atrial pressure unable to be performed due to mechanical ventilation. IAS/Shunts: The interatrial septum was not well visualized.  LEFT VENTRICLE PLAX 2D LVIDd:         3.80 cm LVIDs:         2.60 cm LV PW:         1.40 cm LV IVS:        1.50 cm LVOT diam:     2.00 cm LVOT Area:     3.14 cm  RIGHT VENTRICLE RV Basal diam:  4.80 cm RV Mid diam:    5.10 cm LEFT ATRIUM         Index LA diam:    3.60 cm 1.68 cm/m   AORTA Ao Root diam: 3.80 cm Ao Asc diam:  4.00 cm TRICUSPID VALVE TR Peak grad:   19.4 mmHg TR Vmax:        220.00 cm/s  SHUNTS Systemic Diam: 2.00 cm Buford Dresser MD Electronically signed by Buford Dresser MD Signature Date/Time: 07/18/2021/1:12:06 PM    Final    IR PERCUTANEOUS ART THROMBECTOMY/INFUSION INTRACRANIAL INC DIAG ANGIO  Result Date: 07/19/2021 CLINICAL DATA:  Onset of left gaze deviation, right sided hemiplegia, and global aphasia. Occluded left middle cerebral artery M1 segment on CT angiogram of the head and neck. EXAM: IR PERCUTANEOUS ART THORMBECTOMY/INFUSION INTRACRANIAL INCLUDE DIAG ANGIO COMPARISON:  CT angiogram of the head and neck of July 17, 2021. MEDICATIONS: Heparin no units IV. Ancef 2 g IV antibiotic was administered within 1 hour of the procedure. ANESTHESIA/SEDATION: General anesthesia. CONTRAST:  Omnipaque 300 approximately 50 mL. FLUOROSCOPY TIME:  Fluoroscopy Time: 50 minutes 36 seconds (1110 mGy). COMPLICATIONS: None immediate. TECHNIQUE: Informed written consent was obtained from the patient after a thorough discussion of the procedural risks, benefits and alternatives. All questions were addressed. Maximal Sterile Barrier Technique was utilized including caps, mask, sterile gowns, sterile gloves, sterile drape, hand hygiene and skin antiseptic. A  timeout was performed prior to the initiation of the procedure. The right  groin was prepped and draped in the usual sterile fashion. Thereafter using modified Seldinger technique, transfemoral access into the right common femoral artery was obtained without difficulty. Over a 0.035 inch guidewire, an 8 French 25 Pinnacle sheath was inserted. Through this, and also over 0.035 inch guidewire, combination of a 5.5 [place] Simmons 2 support catheter inside of an 087 95 cm balloon guide catheter which had been prepped with 50% heparin, and 50% heparinized saline infusion was then advanced and selectively positioned in the left common carotid artery. The arteriogram was then performed centered extra cranially and intracranially which demonstrated the left external carotid artery and its major branches to be widely patent. The left internal carotid artery at the bulb to the cranial skull base demonstrates wide patency. The petrous, the cavernous and the supraclinoid segments opacify widely. A left posterior communicating artery is seen opacifying the left posterior cerebral distribution. The left anterior cerebral artery opacifies into the capillary and venous phases. The left middle cerebral artery demonstrates the superior division to be widely patent. Inferior division in the M3 region demonstrates complete occlusion with a large area of hypoperfusion in the left parietal lobe on the delayed arterial and capillary phases. Thereafter, over an 035 inch regular Glidewire, the combination of the support catheter, and the balloon guide was then advanced to the mid left cervical ICA. The guidewire was removed. Good aspiration obtained from the hub of the balloon guide catheter. A gentle control arteriogram performed through the balloon guide catheter demonstrated no evidence of intraluminal filling defects or of vasospasm or dissections. Over a 0.014 inch standard Synchro micro guidewire with a moderate J configuration an 021  Phenom 160 cm microcatheter inside of an 071 132 cm Zoom aspiration catheter combination, was then advanced to the proximal left middle cerebral artery. Using a torque device the micro guidewire was then advanced gently through the occluded inferior division into the proximal M3 segment followed by the microcatheter. The guidewire was removed. Good aspiration was obtained from the hub of the microcatheter. A gentle control arteriogram performed through the microcatheter demonstrated safe positioning of the tip of the microcatheter with antegrade flow. This was connected to continuous heparinized saline infusion. A 4 mm x 40 mm Solitaire X retrieval device was then advanced to the distal end of the microcatheter. The microcatheter was then retrieved unsheathing the retrieval device such that the proximal portion of the retrieval device was in the proximal segment of the occlusion. At this time, the Zoom aspiration catheter was advanced and positioned just inside the origin of the inferior division. With proximal flow arrest in the proximal left internal carotid artery, and constant aspiration applied at the hub of the balloon guide catheter with a 20 mL syringe, and a Penumbra aspiration device at the hub of the Zoom aspiration catheter for approximately 3 minutes, the combination of the retrieval device, the microcatheter, and the Zoom aspiration catheter were retrieved and removed. Proximal flow arrest was then reversed. A control arteriogram performed through the balloon guide catheter in the mid left internal carotid artery now demonstrated complete revascularization of the previously occluded inferior division. The superior division remained widely patent. A TICI 3 revascularization of the left MCA territory was evidenced without any spasm. The left anterior cerebral artery opacified into the capillary and venous phases. Balloon guide was then retrieved into the left common carotid artery and control arteriogram  performed through this demonstrated the left internal carotid artery to be widely patent extra cranially and intracranially. The  balloon guide catheter was removed. The 8 French Pinnacle sheath was then removed with successful hemostasis at the right groin puncture site with a 6 French Angio-Seal closure device, and manual compression with quick clot for approximately 15 minutes. CT of the brain demonstrated no evidence of intracranial hemorrhage or mass effect. Distal pulses remained Dopplerable in both feet unchanged. Patient was left intubated due to patient not being able to support oxygen concentrations following reversal. Patient was then transferred to the neuro ICU for post thrombectomy management. FINDINGS: As above. IMPRESSION: Status post endovascular complete revascularization of left middle cerebral artery distribution as described using a single pass with a 4 mm x 40 mm Solitaire X retrieval device, and contact aspiration achieving a TICI 3 revascularization. PLAN: As per referring MD. Electronically Signed   By: Luanne Bras M.D.   On: 07/19/2021 08:20   CT HEAD CODE STROKE WO CONTRAST  Result Date: 07/10/2021 CLINICAL DATA:  Code stroke. Neuro deficit, acute, stroke suspected. Right facial droop. EXAM: CT HEAD WITHOUT CONTRAST TECHNIQUE: Contiguous axial images were obtained from the base of the skull through the vertex without intravenous contrast. COMPARISON:  06/26/2021 FINDINGS: Brain: Atrophy. Chronic small-vessel ischemic changes of the white matter. 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 No parenchymal finding of acute infarction, mass lesion, hemorrhage, hydrocephalus or extra-axial collection. Vascular: Hyperdense left middle cerebral artery. Skull: Normal Sinuses/Orbits: Clear/normal Other: None ASPECTS ([place] Stroke Program Early CT Score) - Ganglionic level infarction (caudate, lentiform nuclei, internal capsule, insula, M1-M3 cortex): 7 - Supraganglionic infarction (M4-M6 cortex): 3  Total score (0-10 with 10 being normal): 10 IMPRESSION: 1. Hyperdense left middle cerebral artery worrisome for acute embolic occlusion. No parenchymal finding of stroke at this time. 2. Chronic small-vessel ischemic changes of the white matter. 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 3. ASPECTS is 10. 4. These results were communicated to Dr. Quinn Axe at 11:36 am on 06/30/2021 by text page via the [facility] messaging system. Electronically Signed   By: Nelson Chimes M.D.   On: 07/25/2021 11:37   CT ANGIO HEAD NECK W WO CM W PERF (CODE STROKE)  Result Date: 07/22/2021 CLINICAL DATA:  Neuro deficit, acute, stroke suspected. Additional history provided: Right-sided facial droop, right-sided flaccid. EXAM: CT ANGIOGRAPHY HEAD AND NECK CT PERFUSION BRAIN TECHNIQUE: Multidetector CT imaging of the head and neck was performed using the standard protocol during bolus administration of intravenous contrast. Multiplanar CT image reconstructions and MIPs were obtained to evaluate the vascular anatomy. Carotid stenosis measurements (when applicable) are obtained utilizing NASCET criteria, using the distal internal carotid diameter as the denominator. Multiphase CT imaging of the brain was performed following IV bolus contrast injection. Subsequent parametric perfusion maps were calculated using RAPID software. CONTRAST:  196mL OMNIPAQUE IOHEXOL 350 MG/ML SOLN COMPARISON:  Noncontrast head CT performed earlier today 07/27/2021. Thyroid ultrasound 07/11/2019. Report from ultrasound-guided thyroid FNA 08/12/2019. FINDINGS: CTA NECK FINDINGS Aortic arch: Standard aortic branching. Atherosclerotic plaque within the visualized aortic arch and proximal major branch vessels of the neck. No hemodynamically significant innominate or proximal subclavian artery stenosis. Right carotid system: CCA and ICA patent within the neck without stenosis. Minimal soft plaque within the CCA and about the carotid bifurcation. Left carotid system: CCA and ICA patent  within the neck without stenosis. Minimal soft plaque within the CCA and about the carotid bifurcation. Vertebral arteries: Vertebral arteries patent within the neck. The right vertebral artery is dominant. Nonstenotic atherosclerotic plaque at the origin of the right vertebral artery. Stenosis at the origin of the left vertebral artery.  Sites of up to moderate stenosis within the distal V2 left vertebral artery. Skeleton: Cervical spondylosis. No acute bony abnormality or aggressive osseous lesion. Other neck: Multiple nodules within the left thyroid lobe, the largest measuring 2.8 cm. Also of note, a nodule within the inferior left thyroid lobe measures 2.2 cm (series 6, image 284). No cervical lymphadenopathy. Mild Upper chest: No consolidation within the imaged lung apices. Review of the MIP images confirms the above findings CTA HEAD FINDINGS Anterior circulation: The intracranial internal carotid arteries are patent. Atherosclerotic plaque within both vessels abnormal with mild stenosis. There is occlusion of the left middle cerebral artery at the mid M1 segment. There is some enhancement of M2 and more distal left MCA vessels, although asymmetrically diminished as compared to the right. The M1 right middle cerebral artery is patent. No right M2 proximal branch occlusion or high-grade proximal stenosis is identified. The anterior cerebral arteries are patent. No intracranial aneurysm is identified. Posterior circulation: The intracranial vertebral arteries are patent. The basilar artery is patent. The posterior cerebral arteries are patent. Posterior communicating arteries are present bilaterally. Venous sinuses: Within the limitations of contrast timing, no convincing thrombus. Anatomic variants: None significant. Review of the MIP images confirms the above findings CT Brain Perfusion Findings: Poor source data quality/bolus timing may limit the reliability of the provided perfusion values. CBF (<30%) Volume:  10mL Perfusion (Tmax>6.0s) volume: 159mL Mismatch Volume: 158mL Infarction Location:None identified Emergent results were called by telephone at the time of interpretation on 06/29/2021 at 12:17 pm to provider [facility] , who verbally acknowledged these results. IMPRESSION: CTA neck: 1. The common carotid and cervical internal carotid arteries are patent within the neck without stenosis. Minimal soft plaque within these vessels, as described. 2. Vertebral arteries patent within the neck. Atherosclerotic narrowing at the left vertebral artery origin (mild) and V2 segment (moderate). 3. Multiple left thyroid lobe nodules, measuring up to 2.8 cm. By report, ultrasound-guided FNA was performed on the largest nodule on 08/12/2019. However, there is a 2.2 cm nodule within the inferior left thyroid lobe which was not described on the prior thyroid ultrasound reports and may be new. A nonemergent repeat thyroid ultrasound is recommended for further evaluation. CTA head: 1. Occlusion of the left middle cerebral artery at the mid M1 segment. 2. Mild atherosclerotic plaque within the intracranial internal carotid arteries. CT perfusion head: 1. Poor source data quality/bolus timing may limit the reliability of the provided perfusion values. 2. The perfusion software identifies a region of critically hypoperfused parenchyma within the left MCA vascular territory totaling 118 mL (utilizing the Tmax>6 seconds threshold). No core infarct is identified. Reported mismatch volume: 118 mL Electronically Signed   By: Kellie Simmering D.O.   On: 07/13/2021 12:40    Labs:  CBC: Recent Labs    07/16/21 1145 07/08/2021 1122 07/22/2021 2014 07/18/21 0114 07/18/21 0352 07/18/21 0523 07/19/21 0455  WBC 6.6 9.5  --   --   --  16.4* 15.7*  HGB 15.3 14.9   < > 11.9* 12.9* 12.6* 10.8*  HCT 48.1 46.3   < > 35.0* 38.0* 40.4 34.1*  PLT 141* 125*  --   --   --  131* 126*   < > = values in this interval not displayed.      COAGS: Recent Labs    07/14/2021 1122 07/18/21 0523 07/18/21 1549  INR 1.4* 1.6*  --   APTT 31 40* 82*     BMP: Recent Labs  07/16/21 1146 07/23/2021 1122 07/19/2021 2014 07/18/21 0114 07/18/21 0352 07/18/21 0523 07/19/21 0455  NA 134* 138   < > 146* 142 139 142  K 4.1 4.2   < > 4.8 4.0 4.5 4.0  CL 99 98  --   --   --  105 105  CO2 24 26  --   --   --  17* 22  GLUCOSE 192* 139*  --   --   --  313* 279*  BUN 21 23  --   --   --  20 32*  CALCIUM 9.5 9.3  --   --   --  7.6* 8.2*  CREATININE 1.46* 1.65*  --   --   --  2.00* 1.87*  GFRNONAA 49* 42*  --   --   --  34* 36*   < > = values in this interval not displayed.     LIVER FUNCTION TESTS: Recent Labs    07/13/2021 1122 07/19/21 0455  BILITOT 1.5* 0.9  AST 22 78*  ALT 22 101*  ALKPHOS 50 41  PROT 7.4 5.6*  ALBUMIN 3.7 2.5*     Assessment and Plan:  80 y.o. male with acute CVA s/p revascularization of occluded Lt MCA achieving TICI 3 with Dr. Estanislado Pandy; hospital course complicated by bilateral PE R>L, s/p thrombectomy from right PA. Patient became hemodynamically unstable, the procedure was aborted, patient was put on tPA and he has transitioned to heparin infusion. Venous and arterial sheaths were left in place on 12/21, which were removed in IR on 12/22.   Patient critically ill, remains intubated and sedated and on pressors.  Right groin sheaths removed in IR yesterday.   Right CFA puncture site is hard to touch, no pulse noted so most likely hematoma.  Doppler ordered to confirm.   Further treatment plan per [facility]  Appreciate and agree with the plan.  Please call NIR/ IR for questions and concerns.    Electronically Signed: Tera Mater, PA-C 07/19/2021, 11:17 AM   I spent a total of 25 Minutes at the the patient's bedside AND on the patient's hospital floor or unit, greater than 50% of which was counseling/coordinating care for Codes stroke and PE thrombectomy.   This chart was dictated  using voice recognition software.  Despite best efforts to proofread,  errors can occur which can change the documentation meaning.

## 2021-07-19 NOTE — Procedures (Signed)
Patient Name: Jaime Whitaker  MRN: [id number]  Epilepsy Attending: Charlsie Quest  Referring Physician/Provider: Dr Omar Person, Date: 07/18/2021 Duration: 21.54 mins  Patient history: 80 year old male with left M1 occlusion status post thrombectomy and PEA arrest due to PEA.  EEG for seizure.  Level of alertness:  comatose  AEDs during EEG study: Propofol  Technical aspects: This EEG study was done with scalp electrodes positioned according to the 10-20 International system of electrode placement. Electrical activity was acquired at a sampling rate of 500Hz  and reviewed with a high frequency filter of 70Hz  and a low frequency filter of 1Hz . EEG data were recorded continuously and digitally stored.   Description: EEG showed continuous generalized and lateralized left hemisphere 3 to 6 Hz theta- delta slowing. Hyperventilation and photic stimulation were not performed.     ABNORMALITY - Continuous slow, generalized and lateralized left hemisphere  IMPRESSION: This study is suggestive of cortical dysfunction in left hemisphere likely due to underlying stroke. Additionally there is moderate to severe degree of encephalopathy, nonspecific etiology.  No seizures or epileptiform discharges were seen throughout the recording.  Janey Petron 

## 2021-07-19 NOTE — Progress Notes (Signed)
Right groin pseudoaneurysm evaluation completed. Refer to "CV Proc" under chart review to view preliminary results.  07/19/2021 12:19 PM Eula Fried., MHA, RVT, RDCS, RDMS

## 2021-07-19 NOTE — Progress Notes (Signed)
ANTICOAGULATION CONSULT NOTE  Pharmacy Consult:  Heparin Indication: Acute bilateral PEs and history of DVT  No Known Allergies  Patient Measurements: Height: 5\' 9"  (175.3 cm) Weight: 98.9 kg (218 lb 0.6 oz) IBW/kg (Calculated) : 70.7 Heparin Dosing Weight: 91 kg  Vital Signs: Temp: 98.6 F (37 C) (12/23 1100) BP: 98/61 (12/23 1100) Pulse Rate: 96 (12/23 1100)  Labs: Recent Labs    08-05-2021 1122 2021-08-05 2014 07/18/21 0352 07/18/21 0523 07/18/21 1549 07/18/21 2345 07/19/21 0455 07/19/21 0919  HGB 14.9   < > 12.9* 12.6*  --   --  10.8*  --   HCT 46.3   < > 38.0* 40.4  --   --  34.1*  --   PLT 125*  --   --  131*  --   --  126*  --   APTT 31  --   --  40* 82*  --   --   --   LABPROT 16.8*  --   --  19.0*  --   --   --   --   INR 1.4*  --   --  1.6*  --   --   --   --   HEPARINUNFRC  --   --   --   --  0.38 <0.10*  --  0.32  CREATININE 1.65*  --   --  2.00*  --   --  1.87*  --    < > = values in this interval not displayed.     Estimated Creatinine Clearance: 37.8 mL/min (A) (by C-G formula based on SCr of 1.87 mg/dL (H)).   Assessment: 62 YOM presented with right-sided weakness and aphagia, underwent revascularization of L MCA.  Also found to have acute bilateral PEs in setting of recent DVT, so Pharmacy consulted to dose IV heparin. He was on Eliquis PTA, but taken off on 12/18 for EGD.  Patient taken to IR 12/21 pm for PE thrombectomy and unable to aspirate significant clot. Patient with loss of pulses and coded in IR. Pt with multiple relative contraindications to lytics but decision made to give tenecteplase 12/22 ~0100.  Heparin resumed 12/22.  Heparin level therapeutic at 0.32 after rate increase to 1300 units/hr this AM. No issues with line or bleeding reported per RN.  Goal of Therapy:  Heparin level 0.3-0.5 units/ml Monitor platelets by anticoagulation protocol: Yes   Plan:  Continue heparin infusion at 1300 units/hr Check 8hr confirmatory heparin  level with acute PE Monitor daily CBC, s/sx bleeding   1/23, PharmD, BCPS Please check AMION for all [facility] Pharmacy contact numbers Clinical Pharmacist 07/19/2021 11:40 AM

## 2021-07-19 NOTE — Progress Notes (Signed)
NAME:  Jaime Whitaker, MRN:  [id number], DOB:  02/18/1941, LOS: 2 ADMISSION DATE:  07/02/2021, CONSULTATION DATE:  07/02/2021 REFERRING MD:  Corliss Skains, CHIEF COMPLAINT:  weakness   History of Present Illness:  80yM on eliquis for ?recently discovered DVT, IDA, PMR, OSA, CVA in 1994. History is obtained through chart review and discussion with the patient's wife. He was planning to undergo EGD for intervention on AVM felt to be potential culprit for IDA today and had held eliquis since 12/18. Today had R sided weakness, aphasia and gaze deviation.  He was also noted to be hypoxic in ED requiring NRB. Found to have L M1 occlusion now s/p aspiration with TICI 3 revascularization. Remained hypoxic after reversal of NMB, left intubated and arrived to neuro ICU on 100 of neo, 50 of propofol, saline infusion at 75 mL/hr  Pertinent  Medical History  DVT IDA PMR OSA CVA  Significant Hospital Events: Including procedures, antibiotic start and stop dates in addition to other pertinent events   07/13/2021 intubated, mechanical thrombectomy L M1 with TICI 3 revascularization  Interim History / Subjective:  Titrating sedation and epi up with shivering overnight, vent dyssynchrony.   Objective   Blood pressure (!) 109/59, pulse 84, temperature 98.2 F (36.8 C), resp. rate (!) 28, height 5\' 9"  (1.753 m), weight 98.9 kg, SpO2 100 %.    Vent Mode: PRVC FiO2 (%):  [40 %-60 %] 40 % Set Rate:  [28 bmp] 28 bmp Vt Set:  [560 mL] 560 mL PEEP:  [5 cmH20] 5 cmH20 Plateau Pressure:  [18 cmH20-22 cmH20] 20 cmH20   Intake/Output Summary (Last 24 hours) at 07/19/2021 0835 Last data filed at 07/19/2021 0800 Gross per 24 hour  Intake 2686.63 ml  Output 750 ml  Net 1936.63 ml   Filed Weights   07/11/2021 1126 07/19/2021 1550  Weight: 104.3 kg 98.9 kg    Examination: General appearance: 80 y.o., male, intubated Eyes: pupils again equal very sluggishly reactive if at all HENT: NCAT; MMM Neck: Trachea  midline; no lymphadenopathy, no JVD Lungs: mech breath sounds bl, equal chest rise CV: RRR, no murmur  Abdomen: Soft, non-tender; non-distended, BS present  Extremities: 1+ peripheral edema, warm Skin: Normal turgor and texture; no rash Neuro: does not withdraw or posture to noxious stimuli  KUB with OG advanced pretty far into stomach  TTE with dilated RV with depressed function  Resolved Hospital Problem list     Assessment & Plan:   # Left M1 occlusion s/p thrombectomy: - MR brain wo at 72h as below  - frequent neuro checks - SBP 120-140 - ASA 81 daily - PT/OT/speech eventually - stroke team following  # Acute hypoxic respiratory failure: # History of OSA PE and LLL occlusion/secretions. - position left lung up  - full vent support with lung protective settings, can ideally keep peep < 10 in setting acute neurologic injury - sedation goal RASS -2 to -3  - s/p thrombectomy, systemic TNK and now on heparin gtt  # PEA arrest Due to PE, s/p systemic TNK - f/u TTE - normothermia protocol - MRI at 72h - watch for seizure activity  # Hypotension: May be combination of post arrest vasoplegia, PE. - swap epi for levo for SBP 120-140 given issues with tachycardia overnight  # PMR - prednisone 5 mg daily  # Non-oliguric AKI vs CKD: - limit nephrotoxic medications - CTM  # DVT - heparin gtt as above   Best Practice (right click and "Reselect  all SmartList Selections" daily)   Diet/type: NPO w/ meds via tube DVT prophylaxis: systemic heparin GI prophylaxis: PPI Lines: N/A Foley:  Yes, and it is still needed Code Status:  full code Last date of multidisciplinary goals of care discussion [Family updated again today at bedside. DNR after discussion with Dr. Peggye Pitt last night. Tentatively plan to reassess goals of care at 3 days to see if making progress toward recovery and assess extent of anoxic injury with MRI, will consult palliative care today]  Critical care  time: 34 minutes

## 2021-07-19 NOTE — Progress Notes (Signed)
Inpatient Diabetes Program Recommendations  AACE/ADA: New Consensus Statement on Inpatient Glycemic Control (2015)  Target Ranges:  Prepandial:   less than 140 mg/dL      Peak postprandial:   less than 180 mg/dL (1-2 hours)      Critically ill patients:  140 - 180 mg/dL   Lab Results  Component Value Date   GLUCAP 296 (H) 07/19/2021   HGBA1C 7.4 (H) 07/18/2021    Review of Glycemic Control  Latest Reference Range & Units 07/18/21 08:10 07/18/21 11:45 07/18/21 15:34 07/18/21 19:10 07/18/21 23:07 07/19/21 03:13 07/19/21 07:26  Glucose-Capillary 70 - 99 mg/dL 606 (H) 301 (H) 601 (H) 172 (H) 180 (H) 233 (H) 296 (H)   Diabetes history: DM 2 Outpatient Diabetes medications: None Current orders for Inpatient glycemic control:  Novolog 0-15 units Q4 Novolog 4 units Q4 Tube Feed Coverage  Vital 1.5 60 ml/hour  Inpatient Diabetes Program Recommendations:   -  add Semglee 10 units  Thanks,  Christena Deem RN, MSN, BC-ADM Inpatient Diabetes Coordinator Team Pager [phone] (8a-5p)

## 2021-07-19 NOTE — Progress Notes (Signed)
SLP Cancellation Note  Patient Details Name: Jaime Whitaker MRN: [id number] DOB: 1940/09/17   Cancelled treatment:       Reason Eval/Treat Not Completed: Patient not medically ready (remains on vent). Will continue to follow as able.    Mahala Menghini., M.A. CCC-SLP Acute Rehabilitation Services Pager [phone] Office [phone]  07/19/2021, 7:42 AM

## 2021-07-19 NOTE — Progress Notes (Signed)
ANTICOAGULATION CONSULT NOTE  Pharmacy Consult:  Heparin Indication: Acute bilateral PEs and history of DVT  No Known Allergies  Patient Measurements: Height: 5\' 9"  (175.3 cm) Weight: 98.9 kg (218 lb 0.6 oz) IBW/kg (Calculated) : 70.7 Heparin Dosing Weight: 91 kg  Vital Signs: Temp: 97.7 F (36.5 C) (12/23 0000) BP: 78/53 (12/22 2330) Pulse Rate: 91 (12/22 2330)  Labs: Recent Labs    07/16/21 1145 07/16/21 1146 07/19/2021 1122 07/02/2021 2014 07/18/21 0114 07/18/21 0352 07/18/21 0523 07/18/21 1549 07/18/21 2345  HGB 15.3  --  14.9   < > 11.9* 12.9* 12.6*  --   --   HCT 48.1  --  46.3   < > 35.0* 38.0* 40.4  --   --   PLT 141*  --  125*  --   --   --  131*  --   --   APTT  --   --  31  --   --   --  40* 82*  --   LABPROT  --   --  16.8*  --   --   --  19.0*  --   --   INR  --   --  1.4*  --   --   --  1.6*  --   --   HEPARINUNFRC  --   --   --   --   --   --   --  0.38 <0.10*  CREATININE  --  1.46* 1.65*  --   --   --  2.00*  --   --    < > = values in this interval not displayed.     Estimated Creatinine Clearance: 35.3 mL/min (A) (by C-G formula based on SCr of 2 mg/dL (H)).   Assessment: 77 YOM presented with right-sided weakness and aphagia, underwent revascularization of L MCA.  Also found to have acute bilateral PEs in setting of recent DVT, so Pharmacy consulted to dose IV heparin.  He was on Eliquis PTA, but taken off on 12/18 for EGD.  Patient taken to IR 12/21 pm for PE thrombectomy and unable to aspirate significant clot. Patient with loss of pulses and coded in IR. Pt with multiple relative contraindications to lytics but decision made to give tenecteplase 12/22 ~0100.  Heparin resumed 12/22.  Heparin level down to undetectable on infusion at 1100 units/hr.  No issues with line or bleeding reported per RN.  Goal of Therapy:  Heparin level 0.3-0.5 units/ml Monitor platelets by anticoagulation protocol: Yes   Plan:  Increase heparin gtt to 1300  units/hr Check 8 hr heparin level  1/23, PharmD, BCPS Please see amion for complete clinical pharmacist phone list 07/19/2021, 12:44 AM

## 2021-07-19 NOTE — Progress Notes (Signed)
OT Cancellation Note  Patient Details Name: ABHIRAM CRIADO MRN: [id number] DOB: 02-19-1941   Cancelled Treatment:    Reason Eval/Treat Not Completed: Patient not medically ready.  Continue to monitor for appropriateness.    Iona Stay D [place] 07/19/2021, 10:33 AM

## 2021-07-19 NOTE — Procedures (Addendum)
Patient Name: Jaime Whitaker  MRN: [id number]  Epilepsy Attending: Charlsie Quest  Referring Physician/Provider: Dr Omar Person, Duration: 07/18/2021 1755 to 07/19/2021 1755   Patient history: 80 year old male with left M1 occlusion status post thrombectomy and PEA arrest due to PEA.  EEG for seizure.   Level of alertness:  comatose   AEDs during EEG study: Propofol   Technical aspects: This EEG study was done with scalp electrodes positioned according to the 10-20 International system of electrode placement. Electrical activity was acquired at a sampling rate of 500Hz  and reviewed with a high frequency filter of 70Hz  and a low frequency filter of 1Hz . EEG data were recorded continuously and digitally stored.    Description: EEG showed continuous generalized and lateralized left hemisphere 3 to 6 Hz theta- delta slowing. Hyperventilation and photic stimulation were not performed.      ABNORMALITY - Continuous slow, generalized and lateralized left hemisphere   IMPRESSION: This study is suggestive of cortical dysfunction in left hemisphere likely due to underlying stroke. Additionally there is moderate to severe degree of encephalopathy, nonspecific etiology.  No seizures or epileptiform discharges were seen throughout the recording.   Aiyla Baucom 

## 2021-07-19 NOTE — Progress Notes (Signed)
ANTICOAGULATION CONSULT NOTE  Pharmacy Consult:  Heparin Indication: Acute bilateral PEs and history of DVT  No Known Allergies  Patient Measurements: Height: 5\' 9"  (175.3 cm) Weight: 98.9 kg (218 lb 0.6 oz) IBW/kg (Calculated) : 70.7 Heparin Dosing Weight: 91 kg  Vital Signs: Temp: 99 F (37.2 C) (12/23 1700) BP: 103/58 (12/23 1700) Pulse Rate: 80 (12/23 1700)  Labs: Recent Labs    Aug 03, 2021 1122 2021/08/03 2014 07/18/21 0352 07/18/21 0523 07/18/21 1549 07/18/21 1549 07/18/21 2345 07/19/21 0455 07/19/21 0919 07/19/21 1644  HGB 14.9   < > 12.9* 12.6*  --   --   --  10.8*  --   --   HCT 46.3   < > 38.0* 40.4  --   --   --  34.1*  --   --   PLT 125*  --   --  131*  --   --   --  126*  --   --   APTT 31  --   --  40* 82*  --   --   --   --   --   LABPROT 16.8*  --   --  19.0*  --   --   --   --   --   --   INR 1.4*  --   --  1.6*  --   --   --   --   --   --   HEPARINUNFRC  --   --   --   --  0.38   < > <0.10*  --  0.32 0.33  CREATININE 1.65*  --   --  2.00*  --   --   --  1.87*  --   --    < > = values in this interval not displayed.     Estimated Creatinine Clearance: 37.8 mL/min (A) (by C-G formula based on SCr of 1.87 mg/dL (H)).   Assessment: Jaime Whitaker presented with right-sided weakness and aphagia, underwent revascularization of L MCA.  Also found to have acute bilateral PEs in setting of recent DVT, so Pharmacy consulted to dose IV heparin. He was on Eliquis PTA, but taken off on 12/18 for EGD.  Patient taken to IR 12/21 pm for PE thrombectomy and unable to aspirate significant clot. Patient with loss of pulses and coded in IR. Pt with multiple relative contraindications to lytics but decision made to give tenecteplase 12/22 ~0100.  Heparin resumed 12/22.  Heparin level remains therapeutic at 0.33, on 1300 units/hr. Hgb 10.8, plt 126. No s/sx of bleeding or infusion issues.   Goal of Therapy:  Heparin level 0.3-0.5 units/ml Monitor platelets by anticoagulation  protocol: Yes   Plan:  Continue heparin infusion at 1300 units/hr Monitor daily CBC, s/sx bleeding  1/23, PharmD, BCCCP Clinical Pharmacist  Phone: [phone] 07/19/2021 5:14 PM  Please check AMION for all [facility] Pharmacy phone numbers After 10:00 PM, call Main Pharmacy [phone]

## 2021-07-19 NOTE — Progress Notes (Signed)
RT NOTE:  Pt transported to CT and back to 4N15 without event.

## 2021-07-19 NOTE — Progress Notes (Addendum)
STROKE TEAM PROGRESS NOTE   INTERVAL HISTORY Patient is seen in his room with his wife and daughter at bedside.  He continues to require pressor support to maintain his MAP.  Neurological exam (on sedation) continues to be poor.  Palliative care consult placed to assist family in GOC planning. Vitals:   07/19/21 0645 07/19/21 0700 07/19/21 0733 07/19/21 0800  BP: (!) 90/50 (!) 101/56 114/60 (!) 109/59  Pulse: 89 88 83 84  Resp: (!) 28 17 (!) 28 (!) 28  Temp:  98.2 F (36.8 C)  98.2 F (36.8 C)  TempSrc:      SpO2: 100% 100% 100% 100%  Weight:      Height:       CBC:  Recent Labs  Lab 07/16/2021 1122 06/30/2021 2014 07/18/21 0523 07/19/21 0455  WBC 9.5  --  16.4* 15.7*  NEUTROABS 5.4  --  14.0*  --   HGB 14.9   < > 12.6* 10.8*  HCT 46.3   < > 40.4 34.1*  MCV 94.3  --  93.7 93.7  PLT 125*  --  131* 126*   < > = values in this interval not displayed.    Basic Metabolic Panel:  Recent Labs  Lab 07/18/21 0523 07/18/21 1549 07/19/21 0455  NA 139  --  142  K 4.5  --  4.0  CL 105  --  105  CO2 17*  --  22  GLUCOSE 313*  --  279*  BUN 20  --  32*  CREATININE 2.00*  --  1.87*  CALCIUM 7.6*  --  8.2*  MG 2.0 2.8* 2.2  PHOS 6.3* 3.8 2.8    Lipid Panel:  Recent Labs  Lab 07/18/21 0523 07/19/21 0455  CHOL 131  --   TRIG 98 136  HDL 40*  --   CHOLHDL 3.3  --   VLDL 20  --   LDLCALC 71  --     HgbA1c:  Recent Labs  Lab 07/18/21 0523  HGBA1C 7.4*    Urine Drug Screen: No results for input(s): LABOPIA, COCAINSCRNUR, LABBENZ, AMPHETMU, THCU, LABBARB in the last 168 hours.  Alcohol Level  Recent Labs  Lab 06/28/2021 1126  ETH <10     IMAGING past 24 hours CT HEAD WO CONTRAST ( )  Result Date: 07/19/2021 CLINICAL DATA:  Follow-up examination for acute stroke, status post mechanical thrombectomy of left M1 occlusion followed by pulmonary embolism and PE a arrest. EXAM: CT HEAD WITHOUT CONTRAST TECHNIQUE: Contiguous axial images were obtained from the base of  the skull through the vertex without intravenous contrast. COMPARISON:  Prior CT from 07/18/2021 FINDINGS: Brain: Continued interval evolution of posterior left MCA distribution infarct involving the left parieto-occipital region, now more apparent as compared to previous exam. Furthermore, now evident are additional multifocal areas of evolving hypodensity involving the left frontal lobe anteriorly (series 3, images 24, 26). Additional scattered hypodensities also noted at the contralateral right frontoparietal region (series 3, image 27, 25). Additional cytotoxic edema now evident at the right occipital lobe as well (series 3, image 18). Patchy hypodensities now seen at the bilateral cerebellar hemispheres as well (series 3, image 10). Findings consistent with additional multifocal ischemic infarcts. Given the various vascular distributions involved, a central thromboembolic etiology could be the causative process. Alternatively, the additional newly seen ischemic changes involving the bilateral cerebral hemispheres are somewhat watershed in distribution, and these findings could reflect a hypoperfusion insult, particularly given patient history. In addition, the deep gray  nuclei are somewhat poorly seen and well demarcated since previous exam, most pronounced about the caudate nuclei. Given the history of recent PA arrest, findings are concerning for a superimposed anoxic injury. Multiple scattered superimposed remote lacunar infarcts noted about the deep gray nuclei as well. No acute intracranial hemorrhage. No significant mass effect or midline shift at this time. No visible mass lesion or extra-axial fluid collection. Vascular: No hyperdense vessel. Scattered vascular calcifications about the carotid siphons. Skull: EEG leads overlie the scalp. Scalp soft tissues demonstrate no other acute finding. Calvarium intact. Sinuses/Orbits: Globes orbital soft tissues demonstrate no acute finding. Scattered mucosal  thickening noted within the sphenoid and maxillary sinuses bilaterally. Endotracheal tube partially visualized. Nasogastric 2 partially seen coiled within the oral cavity. Repositioning recommended. Mastoid air cells remain largely clear. Other: None. IMPRESSION: 1. Continued interval evolution of moderate to large posterior left MCA distribution infarct, now more apparent as compared to previous exam. No significant regional mass effect at this time. No intracranial hemorrhage. 2. Interval development of multiple new scattered hypodensities involving the bilateral cerebral and cerebellar hemispheres, consistent with additional multifocal ischemic infarcts. Given the various vascular distributions involved, a central thromboembolic etiology could be the causative process. Alternatively, these newly seen ischemic changes are somewhat watershed in distribution, and could reflect a hypoperfusion injury (or a combination thereof). 3. Subtle hypodensity and blurring of the deep gray nuclei as compared to previous exam, suspicious for superimposed anoxic injury, particularly given the patient history of recent PEA arrest. 4. Nasogastric tube partially seen coiled within the oral cavity. Repositioning recommended. Electronically Signed   By: Rise Mu M.D.   On: 07/19/2021 03:31   DG CHEST PORT 1 VIEW  Result Date: 07/18/2021 CLINICAL DATA:  Encounter for intubation EXAM: PORTABLE CHEST 1 VIEW COMPARISON:  Radiograph performed earlier on the same date FINDINGS: Interval adjustment of the endotracheal tube now distal tip approximately 5 cm above the carina. Feeding tube coursing below the diaphragm with distal tip not included. Cardiomediastinal silhouette is unchanged. Low lung volumes with left basilar atelectasis, unchanged. IMPRESSION: Interval repositioning of the endotracheal tube now in satisfactory position. No other significant interval change. Electronically Signed   By: Larose Hires D.O.   On:  07/18/2021 11:06   DG Abd Portable 1V  Result Date: 07/19/2021 CLINICAL DATA:  Advanced OG tube EXAM: PORTABLE ABDOMEN - 1 VIEW COMPARISON:  None. FINDINGS: The enteric catheter is coiled in the stomach with the tip projecting over the region of the pylorus/first portion of the duodenum. There is a nonobstructive bowel gas pattern. There is mild gaseous distention of distal large bowel loops, nonspecific. There is no abnormal soft tissue calcification. Post kyphoplasty changes are noted. IMPRESSION: Enteric catheter coiled in the stomach with the tip projecting over the region of the pylorus/first portion of the duodenum. Electronically Signed   By: Lesia Hausen M.D.   On: 07/19/2021 08:30   EEG adult  Result Date: 07/19/2021 Charlsie Quest, MD     07/19/2021  8:47 AM Patient Name: Jaime Whitaker MRN: [id number] Epilepsy Attending: Charlsie Quest Referring Physician/Provider: Dr Omar Person, Date: 07/18/2021 Duration: 21.54 mins Patient history: 80 year old male with left M1 occlusion status post thrombectomy and PEA arrest due to PEA.  EEG for seizure. Level of alertness:  comatose AEDs during EEG study: Propofol Technical aspects: This EEG study was done with scalp electrodes positioned according to the 10-20 International system of electrode placement. Electrical activity was acquired at a sampling rate  of  and reviewed with a high frequency filter of  and a low frequency filter of . EEG data were recorded continuously and digitally stored. Description: EEG showed continuous generalized and lateralized left hemisphere 3 to 6 Hz theta- delta slowing. Hyperventilation and photic stimulation were not performed.   ABNORMALITY - Continuous slow, generalized and lateralized left hemisphere IMPRESSION: This study is suggestive of cortical dysfunction in left hemisphere likely due to underlying stroke. Additionally there is moderate to severe degree of encephalopathy, nonspecific  etiology.  No seizures or epileptiform discharges were seen throughout the recording. Charlsie Quest   ECHOCARDIOGRAM COMPLETE BUBBLE STUDY  Result Date: 07/18/2021    ECHOCARDIOGRAM REPORT   Patient Name:   Jaime Whitaker Date of Exam: 07/18/2021 Medical Rec #:  [id number]      Height:       69.0 in Accession #:    [id number]     Weight:       218.0 lb Date of Birth:  08-16-1940     BSA:          2.143 m Patient Age:    80 years       BP:           138/65 mmHg Patient Gender: M              HR:           120 bpm. Exam Location:  Inpatient Procedure: 2D Echo, Cardiac Doppler, Color Doppler and Intracardiac            Opacification Agent Indications:    Stroke  History:        Patient has no prior history of Echocardiogram examinations.                 Arrythmias:Tachycardia. DVT with PE. Respiratory failure                 requiring mechanical ventilation. Cardiac arrest.  Sonographer:    Roosvelt Maser RDCS Referring Phys: [id number] [facility] MEIER  Sonographer Comments: Technically difficult study due to poor echo windows, suboptimal parasternal window, suboptimal apical window, suboptimal subcostal window and echo performed with patient supine and on artificial respirator. The bubble was not done due to poor image quality that would lead to nondiagnostic bubble study. IMPRESSIONS  1. Left ventricular ejection fraction, by estimation, is 60 to 65%. The left ventricle has normal function. The left ventricle has no regional wall motion abnormalities. There is moderate concentric left ventricular hypertrophy. Left ventricular diastolic function could not be evaluated.  2. Right ventricular systolic function is moderately reduced. The right ventricular size is severely enlarged.  3. The mitral valve is grossly normal. Trivial mitral valve regurgitation. No evidence of mitral stenosis.  4. The aortic valve is grossly normal. There is mild calcification of the aortic valve. There is mild thickening of the aortic  valve. Aortic valve regurgitation is trivial.  5. Aortic dilatation noted. There is mild dilatation of the ascending aorta, measuring 40 mm. Comparison(s): No prior Echocardiogram. Conclusion(s)/Recommendation(s): RV is dilated with reduced function, consistent with history of bilateral PE. FINDINGS  Left Ventricle: Left ventricular ejection fraction, by estimation, is 60 to 65%. The left ventricle has normal function. The left ventricle has no regional wall motion abnormalities. Definity contrast agent was given IV to delineate the left ventricular  endocardial borders. The left ventricular internal cavity size was small. There is moderate concentric left ventricular hypertrophy. Left ventricular diastolic function could not be  evaluated. Right Ventricle: The right ventricular size is severely enlarged. Right vetricular wall thickness was not well visualized. Right ventricular systolic function is moderately reduced. Left Atrium: Left atrial size was not well visualized. Right Atrium: Right atrial size was not well visualized. Pericardium: There is no evidence of pericardial effusion. Presence of epicardial fat layer. Mitral Valve: The mitral valve is grossly normal. Trivial mitral valve regurgitation. No evidence of mitral valve stenosis. Tricuspid Valve: The tricuspid valve is grossly normal. Tricuspid valve regurgitation is trivial. No evidence of tricuspid stenosis. Aortic Valve: The aortic valve is grossly normal. There is mild calcification of the aortic valve. There is mild thickening of the aortic valve. Aortic valve regurgitation is trivial. Pulmonic Valve: The pulmonic valve was not well visualized. Pulmonic valve regurgitation is mild. No evidence of pulmonic stenosis. Aorta: Aortic dilatation noted. There is mild dilatation of the ascending aorta, measuring 40 mm. Venous: IVC assessment for right atrial pressure unable to be performed due to mechanical ventilation. IAS/Shunts: The interatrial septum was  not well visualized.  LEFT VENTRICLE PLAX 2D LVIDd:         3.80 cm LVIDs:         2.60 cm LV PW:         1.40 cm LV IVS:        1.50 cm LVOT diam:     2.00 cm LVOT Area:     3.14 cm  RIGHT VENTRICLE RV Basal diam:  4.80 cm RV Mid diam:    5.10 cm LEFT ATRIUM         Index LA diam:    3.60 cm 1.68 cm/m   AORTA Ao Root diam: 3.80 cm Ao Asc diam:  4.00 cm TRICUSPID VALVE TR Peak grad:   19.4 mmHg TR Vmax:        220.00 cm/s  SHUNTS Systemic Diam: 2.00 cm Jodelle Red MD Electronically signed by Jodelle Red MD Signature Date/Time: 07/18/2021/1:12:06 PM    Final     PHYSICAL EXAM General:  Well-developed elderly intubated male in no acute distress  Respiratory: Respirations even and synchronous with ventilator  Neurological (on sedation with propofol and fentanyl):  Pupils 101mm, left slightly reactive to light, right nonreactive.  Bilateral corneal reflexes present, doll's eyes reflex absent.  Cough reflex present.  No response to sternal rub or noxious stimulation to extremties.  Vascular: Puncture site in right groin is soft with very small palpable hematoma.  No bruising seen beyond bandage.  ASSESSMENT/PLAN Jaime Whitaker is a 80 y.o. male with history of HTN, anemia, sleep apnea, stroke in 1998, HLD, eliquis use for DVT in RLE up until 12/18 presenting with right sided weakness and aphasia. Patient presented with aphasia and right hemiplegia and yesterday underwent emergent mechanical thrombectomy for left M1 occlusion with excellent  TICI 3 revascularization but unfortunately last night developed massive pulmonary embolism with low blood pressure developed bradycardic PEA arrest during pulmonary thrombectomy procedure requiring 30 minutes of CPR before  ROSC.  He has remained unresponsive and on full ventilatory support and vasopressor support. Developed massive PE and went to IR overnight 12/21 for a pulmonary arterial thrombectomy with a subsequent PEA arrest in IR.  He was  treated with IV TNKase for systemic thrombolysis.  CCM initiated arctic sun for possible hypoxic brian injury and an overnight EEG. Plan for MRI after arctic sun is removed. Poor neurological exam, on norepinephrine drip to maintain blood pressure.  Will consult palliative care to assist family in  GOC planning.  Stroke: L MCA infarct at M1 likely secondary source s/p Left M1 thrombectomy with TICI3 revascularization complicated by bilateral pulmonary emboli and PEA arrest Code Stroke- Hyperdense L MCA, chronic small-vessel ischemic changes of white matter. Old thalamic infarction  CTA head & neck-Occlusion of L MCA at mid M1. Mild atherosclerotic plaque.  CT perfusion- Left MCA vascular territory totally , no core infarct  MRI  pending, patient too unstable for scan at this time 2D Echo EF 60-65%, mild dilation of ascending aorta, interatrial septum not well visualized Lower extremity ultrasound pending LDL 71 HgbA1c 7.4 EEG no seizure activity VTE prophylaxis - SCDs Eliquis (apixaban) daily prior to admission, now on No antithrombotic. IV TNK given  Therapy recommendations:  pending Disposition:  pending  Left MCA Thrombectomy TICI3 revascularizion of left MCA  Left intubated, unable to maintain O2 sats  Hypotension Requiring vasopressors Norepinephrine Home BP medication- metoprolol 50mg   Hyperlipidemia Home meds:  Lovastatin 20mg , Pravastatin 20 mg ordered in hospital LDL 71, goal < 70 Continue statin at discharge  Diabetes type II Controlled HgbA1c 7.4, goal < 7.0 SSI Tube feeding Vital HP   Other Stroke Risk Factors Advanced Age >/= 63  Cigarette smoker,advised to stop smoking ETOH use, alcohol level <10, advised to drink no more than 2 drink(s) a day Substance abuse - UDS:  THC No results found for requested labs within last hours., Cocaine No results found for requested labs within last 76 hours.. Patient advised to stop using due to stroke  risk. Obesity, Body mass index is 32.2 kg/m., BMI >/= 30 associated with increased stroke risk, recommend weight loss, diet and exercise as appropriate  Hx stroke/TIA Coronary artery disease Migraines Obstructive sleep apnea, on CPAP at home Congestive heart failure  Other Active Problems PEA Arrest  During bilateral pulmonary artery thrombectomy 30 minutes of CPR before ROSC Initiation of Arctic Sun by CCM Hypoxic injury?  Bilateral Pulmonary Embolism Bilateral pulmonary artery aspiration  TNK IVP during thrombectomy Respiratory Failure Mechanical ventilation Sedation with Propofol, fentanyl Management by CCM- appreciate assistance  Hospital day # 2  Patient seen and examined by NP/APP with MD. MD to update note as needed.   Cortney [id number] , MSN, AGACNP-BC [facility] See Amion for schedule and pager information 07/19/2021 4:52 PM  ATTENDING ATTESTATION:  Pt with 30 min PEA code after left M1 thrombectomy with pulmonary embolus on anticoagulation. Undergoing cooling protocol. CT from overnight shows sig watershed infarcts likely global anoxic  injury. Discussed this with approx 10 family members during pallative care family meeting and showed them CT images as requested. Discussed poor prognosis. Will wait on MRI brain when safe from ICU standpoint.   Update: seen by Radiology Intervention APP. Right groin hematoma confirmed by U/S. Also seen by Diabetic team, added insulin Semglee 10mg . Continue to monitor.  Dr. Ernestina Columbia evaluated pt independently, reviewed imaging, chart, labs. Discussed and formulated plan with the APP. Please see APP note above for details.    This patient is critically ill due to respiratory distress, stroke and at significant risk of neurological worsening, death form heart failure, respiratory failure, recurrent stroke, bleeding from [facility], seizure, sepsis. This patient's care requires constant monitoring of vital signs, hemodynamics,  respiratory and cardiac monitoring, review of multiple databases, neurological assessment, discussion with family, other specialists and medical decision making of high complexity. I spent 35 minutes of neurocritical care time in the care of this patient.   Jaime Gains,MD  To contact Stroke Continuity provider, please refer to [email or web address] After hours, contact General Neurology

## 2021-07-19 NOTE — Progress Notes (Signed)
PT Cancellation Note  Patient Details Name: YAIR DUSZA MRN: [id number] DOB: [birth year]   Cancelled Treatment:    Reason Eval/Treat Not Completed: Patient not medically ready. Pt remains intubated and sedated.   Ilda Foil 07/19/2021, 8:00 AM  Aida Raider, PT  Office # [phone] Pager [phone]

## 2021-07-19 NOTE — Progress Notes (Signed)
EEG maintenance performed.  No skin breakdown observed at electrode sites Fp1, Fp2. 
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
## 2021-07-20 ENCOUNTER — Inpatient Hospital Stay (HOSPITAL_COMMUNITY): Payer: Medicare HMO

## 2021-07-20 DIAGNOSIS — J9601 Acute respiratory failure with hypoxia: Secondary | ICD-10-CM | POA: Diagnosis not present

## 2021-07-20 DIAGNOSIS — R578 Other shock: Secondary | ICD-10-CM | POA: Diagnosis not present

## 2021-07-20 DIAGNOSIS — J9602 Acute respiratory failure with hypercapnia: Secondary | ICD-10-CM | POA: Diagnosis not present

## 2021-07-20 DIAGNOSIS — I63312 Cerebral infarction due to thrombosis of left middle cerebral artery: Secondary | ICD-10-CM | POA: Diagnosis not present

## 2021-07-20 LAB — CBC
HCT: 25.3 % — ABNORMAL LOW (ref 39.0–52.0)
Hemoglobin: 7.9 g/dL — ABNORMAL LOW (ref 13.0–17.0)
MCH: 29.3 pg (ref 26.0–34.0)
MCHC: 31.2 g/dL (ref 30.0–36.0)
MCV: 93.7 fL (ref 80.0–100.0)
Platelets: 138 10*3/uL — ABNORMAL LOW (ref 150–400)
RBC: 2.7 MIL/uL — ABNORMAL LOW (ref 4.22–5.81)
RDW: 19.1 % — ABNORMAL HIGH (ref 11.5–15.5)
WBC: 11.4 10*3/uL — ABNORMAL HIGH (ref 4.0–10.5)
nRBC: 0.2 % (ref 0.0–0.2)

## 2021-07-20 LAB — HEPARIN LEVEL (UNFRACTIONATED)
Heparin Unfractionated: 0.3 IU/mL (ref 0.30–0.70)
Heparin Unfractionated: <0.1 IU/mL — ABNORMAL LOW (ref 0.30–0.70)

## 2021-07-20 LAB — COMPREHENSIVE METABOLIC PANEL
ALT: 58 U/L — ABNORMAL HIGH (ref 0–44)
AST: 42 U/L — ABNORMAL HIGH (ref 15–41)
Albumin: 2.1 g/dL — ABNORMAL LOW (ref 3.5–5.0)
Alkaline Phosphatase: 47 U/L (ref 38–126)
Anion gap: 9 (ref 5–15)
BUN: 32 mg/dL — ABNORMAL HIGH (ref 8–23)
CO2: 21 mmol/L — ABNORMAL LOW (ref 22–32)
Calcium: 7.7 mg/dL — ABNORMAL LOW (ref 8.9–10.3)
Chloride: 110 mmol/L (ref 98–111)
Creatinine, Ser: 1.55 mg/dL — ABNORMAL HIGH (ref 0.61–1.24)
GFR, Estimated: 46 mL/min — ABNORMAL LOW (ref >60)
Glucose, Bld: 206 mg/dL — ABNORMAL HIGH (ref 70–99)
Potassium: 3.4 mmol/L — ABNORMAL LOW (ref 3.5–5.1)
Sodium: 140 mmol/L (ref 135–145)
Total Bilirubin: 0.4 mg/dL (ref 0.3–1.2)
Total Protein: 4.7 g/dL — ABNORMAL LOW (ref 6.5–8.1)

## 2021-07-20 LAB — PHOSPHORUS: Phosphorus: 1.9 mg/dL — ABNORMAL LOW (ref 2.5–4.6)

## 2021-07-20 LAB — MAGNESIUM: Magnesium: 2 mg/dL (ref 1.7–2.4)

## 2021-07-20 MED ORDER — POTASSIUM PHOSPHATES 15 MMOLE/5ML IV SOLN
15.0000 mmol | Freq: Once | INTRAVENOUS | Status: AC
  Administered 2021-07-20: 07:00:00 15 mmol via INTRAVENOUS
  Filled 2021-07-20: qty 5

## 2021-07-20 MED ORDER — POTASSIUM CHLORIDE 20 MEQ PO PACK
40.0000 meq | PACK | Freq: Once | ORAL | Status: AC
  Administered 2021-07-20: 06:00:00 40 meq
  Filled 2021-07-20: qty 2

## 2021-07-20 NOTE — Procedures (Signed)
Central Venous Catheter Insertion Procedure Note  Jaime Whitaker  [id number]  August 16, 1940  Date:07/20/21  Time:3:00 PM   Provider Performing:Nygel Prokop Judie Petit Thora Lance   Procedure: Insertion of Non-tunneled Central Venous [phone]) with US guidance ([id number])   Indication(s) Medication administration  Consent Risks of the procedure as well as the alternatives and risks of each were explained to the patient and/or caregiver.  Consent for the procedure was obtained and is signed in the bedside chart  Anesthesia Topical only with 1% lidocaine   Timeout Verified patient identification, verified procedure, site/side was marked, verified correct patient position, special equipment/implants available, medications/allergies/relevant history reviewed, required imaging and test results available.  Sterile Technique Maximal sterile technique including full sterile barrier drape, hand hygiene, sterile gown, sterile gloves, mask, hair covering, sterile ultrasound probe cover (if used).  Procedure Description Area of catheter insertion was cleaned with chlorhexidine and draped in sterile fashion.  With real-time ultrasound guidance a central venous catheter 2 attempts made at left subclavian - unsuccessful. Pressure held for 10 min. Central line was then placed into the left femoral vein using real-time ultrasound. Nonpulsatile blood flow and easy flushing noted in all ports.  The catheter was sutured in place and sterile dressing applied.     Complications/Tolerance None; patient tolerated the procedure well. Chest X-ray is ordered to verify placement for internal jugular or subclavian cannulation.   Chest x-ray is not ordered for femoral cannulation.  EBL Minimal  Specimen(s) None

## 2021-07-20 NOTE — Progress Notes (Signed)
NAME:  Jaime Whitaker, MRN:  [id number], DOB:  1940/10/31, LOS: 3 ADMISSION DATE:  07/30/2021, CONSULTATION DATE:  07/30/21 REFERRING MD:  Corliss Skains, CHIEF COMPLAINT:  weakness   History of Present Illness:  80yM on eliquis for ?recently discovered DVT, IDA, PMR, OSA, CVA in 1994. History is obtained through chart review and discussion with the patient's wife. He was planning to undergo EGD for intervention on AVM felt to be potential culprit for IDA today and had held eliquis since 12/18. Today had R sided weakness, aphasia and gaze deviation.  He was also noted to be hypoxic in ED requiring NRB. Found to have L M1 occlusion now s/p aspiration with TICI 3 revascularization. Remained hypoxic after reversal of NMB, left intubated and arrived to neuro ICU on 100 of neo, 50 of propofol, saline infusion at 75 mL/hr  Pertinent  Medical History  DVT IDA PMR OSA CVA  Significant Hospital Events: Including procedures, antibiotic start and stop dates in addition to other pertinent events   07-30-21 intubated, mechanical thrombectomy L M1 with TICI 3 revascularization 12/22 mechanical thrombectomy c/b 30 min in and out of PEA arrest, systemic TNK for PE. Made DNR after discussion with family. Normothermia protocol initiated  Interim History / Subjective:  No acute issues. Palliative care involved. No major exam changes.  Objective   Blood pressure (!) 105/58, pulse 71, temperature 98.2 F (36.8 C), resp. rate (!) 28, height 5\' 9"  (1.753 m), weight 98.9 kg, SpO2 100 %.    Vent Mode: PRVC FiO2 (%):  [40 %] 40 % Set Rate:  [28 bmp] 28 bmp Vt Set:  [560 mL] 560 mL PEEP:  [5 cmH20] 5 cmH20 Pressure Support:  [10 cmH20] 10 cmH20 Plateau Pressure:  [17 cmH20-23 cmH20] 17 cmH20   Intake/Output Summary (Last 24 hours) at 07/20/2021 0758 Last data filed at 07/20/2021 0600 Gross per 24 hour  Intake 3937.1 ml  Output 625 ml  Net 3312.1 ml   Filed Weights   07/30/21 1126 07/30/21 1550  Weight:  104.3 kg 98.9 kg    Examination: General appearance: 80 y.o., male, intubated, male, intubated Eyes: pupils again equal very sluggishly reactive if at all HENT: NCAT; MMM Neck: Trachea midline; no lymphadenopathy, no JVD Lungs: mech breath sounds bl, equal chest rise CV: RRR, no murmur  Abdomen: Soft, non-tender; non-distended, hypoactive BS present  Extremities: 1+ peripheral edema, warm Skin: Normal turgor and texture; no rash Neuro: does not withdraw or posture to noxious stimuli, does not breathe over vent, +gag  70 pseudoaneurysm with 1-2cm right groin hematoma  Resolved Hospital Problem list     Assessment & Plan:   # Left M1 occlusion s/p thrombectomy: - MR brain wo at 72h as below  - frequent neuro checks - SBP 120-140 - ASA 81 daily - PT/OT/speech eventually - stroke team following  # Acute hypoxic respiratory failure: # History of OSA PE and LLL occlusion/secretions. - position left lung up - CPT  - full vent support with lung protective settings, can ideally keep peep < 10 in setting acute neurologic injury - sedation goal RASS -2 to -3  - s/p thrombectomy, systemic TNK and now on heparin gtt  # PEA arrest Due to PE, s/p systemic TNK - normothermia protocol - MRI at 72h - watch for seizure activity  # Hypotension: May be combination of post arrest vasoplegia, PE. - wean levo for SBP 120-140  # PMR - prednisone 5 mg daily  # Non-oliguric AKI vs CKD: - limit  nephrotoxic medications - CTM  # DVT - heparin gtt as above   Best Practice (right click and "Reselect all SmartList Selections" daily)   Diet/type: NPO w/ meds via tube DVT prophylaxis: systemic heparin GI prophylaxis: PPI Lines: N/A Foley:  Yes, and it is still needed Code Status:  full code Last date of multidisciplinary goals of care discussion [Palliative care involved, appreciate assistance. Made DNR after discussion post arrest 12/22. Family remains hopeful for recovery however. ]  Critical  care time: 34 minutes

## 2021-07-20 NOTE — Progress Notes (Addendum)
STROKE TEAM PROGRESS NOTE   INTERVAL HISTORY Patient is seen in his room with his daughter at bedside.  He continues to require pressor support to maintain his MAP.  Neurological exam continues to be poor on minimal sedation.  No seizure activity was seen on EEG.  Patient's family had GOC meeting with palliative care yesterday.  They wish to delay decision making until after the holiday and after they are able to see MRI results. Vitals:   07/20/21 0715 07/20/21 0730 07/20/21 0745 07/20/21 0800  BP: (!) 100/57 113/61 116/63 116/61  Pulse: 79 84 83 77  Resp: (!) 28 (!) 28 (!) 28 (!) 28  Temp:    98.2 F (36.8 C)  TempSrc:    Esophageal  SpO2: 100% 100% 100% 100%  Weight:      Height:       CBC:  Recent Labs  Lab 08-16-21 1122 Aug 16, 2021 2014 07/18/21 0523 07/19/21 0455 07/20/21 0512  WBC 9.5  --  16.4* 15.7* 11.4*  NEUTROABS 5.4  --  14.0*  --   --   HGB 14.9   < > 12.6* 10.8* 7.9*  HCT 46.3   < > 40.4 34.1* 25.3*  MCV 94.3  --  93.7 93.7 93.7  PLT 125*  --  131* 126* 138*   < > = values in this interval not displayed.    Basic Metabolic Panel:  Recent Labs  Lab 07/19/21 0455 07/19/21 1644 07/20/21 0405  NA 142  --  140  K 4.0  --  3.4*  CL 105  --  110  CO2 22  --  21*  GLUCOSE 279*  --  206*  BUN 32*  --  32*  CREATININE 1.87*  --  1.55*  CALCIUM 8.2*  --  7.7*  MG 2.2 2.1 2.0  PHOS 2.8 1.8* 1.9*    Lipid Panel:  Recent Labs  Lab 07/18/21 0523 07/19/21 0455  CHOL 131  --   TRIG 98 136  HDL 40*  --   CHOLHDL 3.3  --   VLDL 20  --   LDLCALC 71  --     HgbA1c:  Recent Labs  Lab 07/18/21 0523  HGBA1C 7.4*    Urine Drug Screen: No results for input(s): LABOPIA, COCAINSCRNUR, LABBENZ, AMPHETMU, THCU, LABBARB in the last 168 hours.  Alcohol Level  Recent Labs  Lab 08/16/2021 1126  ETH <10     IMAGING past 24 hours VAS [place] GROIN PSEUDOANEURYSM  Result Date: 07/19/2021  ARTERIAL PSEUDOANEURYSM  Patient Name:  TRACI PLEMONS  Date of Exam:    07/19/2021 Medical Rec #: [id number]       Accession #:    [id number] Date of Birth: 1941-07-24      Patient Gender: M Patient Age:   78 years Exam [facility]  [facility] Procedure:      VAS [place] Bobetta Lime Referring Phys: Lawernce Ion --------------------------------------------------------------------------------  Exam: Right groin Indications: Patient complains of palpable knot and bleeding. History: S/p right femoral artery access x2. Comparison Study: No prior study Performing Technologist: Gertie Fey MHA, RDMS, RVT, RDCS  Examination Guidelines: A complete evaluation includes B-mode imaging, spectral Doppler, color Doppler, and power Doppler as needed of all accessible portions of each vessel. Bilateral testing is considered an integral part of a complete examination. Limited examinations for reoccurring indications may be performed as noted. +------------+----------+---------+------+----------+  Right Duplex PSV (cm/s) Waveform  Plaque Comment(s)  +------------+----------+---------+------+----------+  CFA  69     triphasic                    +------------+----------+---------+------+----------+  PFA              66     triphasic                    +------------+----------+---------+------+----------+  Prox SFA         65     triphasic                    +------------+----------+---------+------+----------+ Right Vein comments:patent CFV  Findings: A mixed echogenic structure measuring approximately 1.3 cm x 0.6 cm is visualized at the right groin with ultrasound characteristics of a hematoma.    --------------------------------------------------------------------------------    Preliminary     PHYSICAL EXAM General:  Well-developed elderly intubated male in no acute distress  Respiratory: Respirations even and synchronous with ventilator  Neurological (on minimal sedation with propofol and fentanyl):  Pupils 40mm, left slightly reactive to light, right nonreactive.   Bilateral corneal reflexes present, doll's eyes reflex absent.  Cough reflex present.  No response to sternal rub or noxious stimulation to extremties.   ASSESSMENT/PLAN Mr. IZAAK SAHR is a 80 y.o. male with history of HTN, anemia, sleep apnea, stroke in 1998, HLD, eliquis use for DVT in RLE up until 12/18 presenting with right sided weakness and aphasia. Patient presented with aphasia and right hemiplegia and yesterday underwent emergent mechanical thrombectomy for left M1 occlusion with excellent  TICI 3 revascularization but unfortunately last night developed massive pulmonary embolism with low blood pressure developed bradycardic PEA arrest during pulmonary thrombectomy procedure requiring 30 minutes of CPR before  ROSC.  He has remained unresponsive and on full ventilatory support and vasopressor support. Developed massive PE and went to IR overnight 12/21 for a pulmonary arterial thrombectomy with a subsequent PEA arrest in IR.  He was treated with IV TNKase for systemic thrombolysis.  CCM initiated arctic sun for possible hypoxic brian injury and an overnight EEG. Plan for MRI after arctic sun is removed. Poor neurological exam, on norepinephrine drip to maintain blood pressure.  Palliative care consulted to assist family in GOC planning.  Stroke: L MCA infarct at M1 likely secondary source s/p Left M1 thrombectomy with TICI3 revascularization complicated by bilateral pulmonary emboli and PEA arrest Code Stroke- Hyperdense L MCA, chronic small-vessel ischemic changes of white matter. Old thalamic infarction  CTA head & neck-Occlusion of L MCA at mid M1. Mild atherosclerotic plaque.  CT perfusion- Left MCA vascular territory totally , no core infarct  MRI  pending 2D Echo EF 60-65%, mild dilation of ascending aorta, interatrial septum not well visualized Right groin ultrasound done and shows hematoma. LDL 71 HgbA1c 7.4 EEG no seizure activity VTE prophylaxis - SCDs Eliquis (apixaban)  daily prior to admission, now on heparin IV.  Therapy recommendations:  pending Disposition:  pending  Left MCA Thrombectomy TICI3 revascularizion of left MCA  Left intubated, unable to maintain O2 sats  Hypotension Requiring vasopressors Norepinephrine Home BP medication- metoprolol 50mg   Hyperlipidemia Home meds:  Lovastatin 20mg , Pravastatin 20 mg ordered in hospital LDL 71, goal < 70 Continue statin at discharge  Diabetes type II Controlled HgbA1c 7.4, goal < 7.0 SSI Tube feeding Vital HP   Other Stroke Risk Factors Advanced Age >/= 54  Cigarette smoker,advised to stop smoking ETOH use, alcohol level <10, advised to drink no more than 2 drink(s)  a day Substance abuse - UDS:  THC No results found for requested labs within last 88502 hours., Cocaine No results found for requested labs within last 77412 hours.. Patient advised to stop using due to stroke risk. Obesity, Body mass index is 32.2 kg/m., BMI >/= 30 associated with increased stroke risk, recommend weight loss, diet and exercise as appropriate  Hx stroke/TIA Coronary artery disease Migraines Obstructive sleep apnea, on CPAP at home Congestive heart failure  Other Active Problems PEA Arrest  During bilateral pulmonary artery thrombectomy 30 minutes of CPR before ROSC Initiation of [place] by CCM Hypoxic injury?  Bilateral Pulmonary Embolism Bilateral pulmonary artery aspiration  TNK IVP during thrombectomy Respiratory Failure Mechanical ventilation Sedation with Propofol, fentanyl Management by CCM- appreciate assistance  Hospital day # 3  Patient seen and examined by NP/APP with MD. MD to update note as needed.   Cortney E Ernestina Columbia , MSN, AGACNP-BC [facility] See Amion for schedule and pager information 07/20/2021 9:07 AM   ATTENDING ATTESTATION:  Exam is unchanged today, on minimal sedation to prevent shivering, will continue to wean after cooling protocol completed. MRI  when able likely Monday. Family do not plan to make decision on GOC until after Christmas. Appreciate excellent CCM assistance.  Dr. Viviann Spare evaluated pt independently, reviewed imaging, chart, labs. Discussed and formulated plan with the APP. Please see APP note above for details.    This patient is critically ill due to respiratory distress, stroke s/p tPA and at significant risk of neurological worsening, death form heart failure, respiratory failure, recurrent stroke, bleeding from [facility], seizure, sepsis. This patient's care requires constant monitoring of vital signs, hemodynamics, respiratory and cardiac monitoring, review of multiple databases, neurological assessment, discussion with family, other specialists and medical decision making of high complexity. I spent 35 minutes of neurocritical care time in the care of this patient.   Kestrel Mis,MD   To contact Stroke Continuity provider, please refer to [email or web address]. After hours, contact General Neurology

## 2021-07-20 NOTE — Progress Notes (Signed)
ANTICOAGULATION CONSULT NOTE  Pharmacy Consult for Heparin Indication: Acute bilateral PEs and history of DVT  No Known Allergies  Patient Measurements: Height: 5\' 9"  (175.3 cm) Weight: 98.9 kg (218 lb 0.6 oz) IBW/kg (Calculated) : 70.7  Heparin Dosing Weight: 91 kg  Vital Signs: Temp: 98.2 F (36.8 C) (12/24 0800) Temp Source: Esophageal (12/24 0800) BP: 116/61 (12/24 0800) Pulse Rate: 77 (12/24 0800)  Labs: Recent Labs    07/20/2021 1122 07/06/2021 2014 07/18/21 0523 07/18/21 1549 07/18/21 2345 07/19/21 0455 07/19/21 0919 07/19/21 1644 07/20/21 0405 07/20/21 0512  HGB 14.9   < > 12.6*  --   --  10.8*  --   --   --  7.9*  HCT 46.3   < > 40.4  --   --  34.1*  --   --   --  25.3*  PLT 125*  --  131*  --   --  126*  --   --   --  138*  APTT 31  --  40* 82*  --   --   --   --   --   --   LABPROT 16.8*  --  19.0*  --   --   --   --   --   --   --   INR 1.4*  --  1.6*  --   --   --   --   --   --   --   HEPARINUNFRC  --   --   --  0.38   < >  --  0.32 0.33 0.30  --   CREATININE 1.65*  --  2.00*  --   --  1.87*  --   --  1.55*  --    < > = values in this interval not displayed.    Estimated Creatinine Clearance: 45.6 mL/min (A) (by C-G formula based on SCr of 1.55 mg/dL (H)).   Assessment: 24 YOM presented with right-sided weakness and aphagia, underwent revascularization of L MCA.  Also found to have acute bilateral PEs in setting of recent DVT, so Pharmacy consulted to dose IV heparin. He was on Eliquis PTA, but taken off on 12/18 for EGD.   Patient taken to IR 12/21 pm for PE thrombectomy and unable to aspirate significant clot. Patient with loss of pulses and coded in IR. Pt with multiple relative contraindications to lytics but decision made to give tenecteplase 12/22 ~0100.  Heparin resumed 12/22.   Heparin level reported as undetectable 1600 12/24. Heparin was held for ~3h (12/24 1345 until 1623). Will re-assess heparin dose/rate with AM labs.   Hgb significantly  dropped 10.8 (12/23) > 7.9 (12/24), plt 138.   Goal of Therapy:  Heparin level 0.3-0.5 units/ml Monitor platelets by anticoagulation protocol: Yes  Plan:  Increase heparin infusion to 1400 units/hr Check heparin level daily while on heparin Continue to monitor H&H and platelets    07-08-1999, PharmD PGY-1 Acute Care Resident  07/20/2021 5:22 PM

## 2021-07-20 NOTE — Progress Notes (Signed)
PT Cancellation Note  Patient Details Name: Jaime Whitaker MRN: [id number] DOB: 01/20/1941   Cancelled Treatment:    Reason Eval/Treat Not Completed: Patient not medically ready Patient still intubated and sedated. Not responsive to neuro exam at this time. PT will sign off at this time. Please re-consult once medically appropriate.   Lamees Gable A. Dan Humphreys PT, DPT Acute Rehabilitation Services Pager [phone] Office [phone]    Viviann Spare 07/20/2021, 8:02 AM

## 2021-07-20 NOTE — Progress Notes (Signed)
LTM EEG discontinued - no skin breakdown at unhook.   

## 2021-07-20 NOTE — Progress Notes (Signed)
eLink Physician-Brief Progress Note Patient Name: Jaime Whitaker DOB: 14-Oct-1940 MRN: [id number]   Date of Service  07/20/2021  HPI/Events of Note  K+ 3.4 Phos 1.9 Has OGT and PIV  Creatinine 1.55 improving  eICU Interventions  Potassium 40 meqs PO and Kphos 15 mmol IV ordered     Intervention Category Intermediate Interventions: Electrolyte abnormality - evaluation and management  [name] 07/20/2021, 5:33 AM

## 2021-07-20 NOTE — Progress Notes (Signed)
OT Cancellation Note  Patient Details Name: Jaime Whitaker MRN: [id number] DOB: 04-23-1941   Cancelled Treatment:    Reason Eval/Treat Not Completed: Patient not medically ready (sedated intubated not following commands and response on neuro exam)  Wynona Neat, OTR/L  Acute Rehabilitation Services Pager: [phone] Office: [phone] .  07/20/2021, 9:05 AM

## 2021-07-20 NOTE — Procedures (Addendum)
Patient Name: Jaime Whitaker  MRN: [id number]  Epilepsy Attending: Charlsie Quest  Referring Physician/Provider: Dr Omar Person, Duration: 07/19/2021 1755 to 07/20/2021 1024   Patient history: 80 year old male with left M1 occlusion status post thrombectomy and PEA arrest due to PEA.  EEG for seizure.   Level of alertness:  comatose   AEDs during EEG study: Propofol   Technical aspects: This EEG study was done with scalp electrodes positioned according to the 10-20 International system of electrode placement. Electrical activity was acquired at a sampling rate of 500Hz  and reviewed with a high frequency filter of 70Hz  and a low frequency filter of 1Hz . EEG data were recorded continuously and digitally stored.    Description: EEG showed continuous generalized and lateralized left hemisphere 3 to 6 Hz theta- delta slowing. Hyperventilation and photic stimulation were not performed.      ABNORMALITY - Continuous slow, generalized and lateralized left hemisphere   IMPRESSION: This study is suggestive of cortical dysfunction in left hemisphere likely due to underlying stroke. Additionally there is moderate to severe degree of encephalopathy, nonspecific etiology.  No seizures or epileptiform discharges were seen throughout the recording.   Pricilla Moehle 

## 2021-07-20 NOTE — Progress Notes (Signed)
ANTICOAGULATION CONSULT NOTE  Pharmacy Consult for Heparin Indication: Acute bilateral PEs and history of DVT  No Known Allergies  Patient Measurements: Height: 5\' 9"  (175.3 cm) Weight: 98.9 kg (218 lb 0.6 oz) IBW/kg (Calculated) : 70.7  Heparin Dosing Weight: 91 kg  Vital Signs: Temp: 98.2 F (36.8 C) (12/24 0800) Temp Source: Esophageal (12/24 0800) BP: 116/61 (12/24 0800) Pulse Rate: 77 (12/24 0800)  Labs: Recent Labs    07/15/2021 1122 07/06/2021 2014 07/18/21 0523 07/18/21 1549 07/18/21 2345 07/19/21 0455 07/19/21 0919 07/19/21 1644 07/20/21 0405 07/20/21 0512  HGB 14.9   < > 12.6*  --   --  10.8*  --   --   --  7.9*  HCT 46.3   < > 40.4  --   --  34.1*  --   --   --  25.3*  PLT 125*  --  131*  --   --  126*  --   --   --  138*  APTT 31  --  40* 82*  --   --   --   --   --   --   LABPROT 16.8*  --  19.0*  --   --   --   --   --   --   --   INR 1.4*  --  1.6*  --   --   --   --   --   --   --   HEPARINUNFRC  --   --   --  0.38   < >  --  0.32 0.33 0.30  --   CREATININE 1.65*  --  2.00*  --   --  1.87*  --   --  1.55*  --    < > = values in this interval not displayed.    Estimated Creatinine Clearance: 45.6 mL/min (A) (by C-G formula based on SCr of 1.55 mg/dL (H)).   Assessment: 63 YOM presented with right-sided weakness and aphagia, underwent revascularization of L MCA.  Also found to have acute bilateral PEs in setting of recent DVT, so Pharmacy consulted to dose IV heparin. He was on Eliquis PTA, but taken off on 12/18 for EGD.   Patient taken to IR 12/21 pm for PE thrombectomy and unable to aspirate significant clot. Patient with loss of pulses and coded in IR. Pt with multiple relative contraindications to lytics but decision made to give tenecteplase 12/22 ~0100.  Heparin resumed 12/22.   Heparin level remains therapeutic at 0.30 but has been trending down, on 1300 units/hr. Hgb significantly dropped 10.8 > 7.9, plt 138. No s/sx of bleeding or infusion  issues. Will empirically increase heparin to 1400 units/hr to prevent heparin level from becoming subtherapeutic.   Goal of Therapy:  Heparin level 0.3-0.5 units/ml Monitor platelets by anticoagulation protocol: Yes  Plan:  Increase heparin infusion to 1400 units/hr Check heparin level in 8 hours and daily while on heparin Continue to monitor H&H and platelets    Thank you for allowing pharmacy to be a part of this patients care.  1/23, PharmD Clinical Pharmacist

## 2021-07-21 ENCOUNTER — Inpatient Hospital Stay (HOSPITAL_COMMUNITY): Payer: Medicare HMO

## 2021-07-21 DIAGNOSIS — I63312 Cerebral infarction due to thrombosis of left middle cerebral artery: Secondary | ICD-10-CM | POA: Diagnosis not present

## 2021-07-21 DIAGNOSIS — J9601 Acute respiratory failure with hypoxia: Secondary | ICD-10-CM | POA: Diagnosis not present

## 2021-07-21 DIAGNOSIS — J9602 Acute respiratory failure with hypercapnia: Secondary | ICD-10-CM | POA: Diagnosis not present

## 2021-07-21 LAB — CBC
HCT: 24.6 % — ABNORMAL LOW (ref 39.0–52.0)
HCT: 25 % — ABNORMAL LOW (ref 39.0–52.0)
Hemoglobin: 7.6 g/dL — ABNORMAL LOW (ref 13.0–17.0)
Hemoglobin: 7.7 g/dL — ABNORMAL LOW (ref 13.0–17.0)
MCH: 29.9 pg (ref 26.0–34.0)
MCH: 29.7 pg (ref 26.0–34.0)
MCHC: 30.9 g/dL (ref 30.0–36.0)
MCHC: 30.8 g/dL (ref 30.0–36.0)
MCV: 96.9 fL (ref 80.0–100.0)
MCV: 96.5 fL (ref 80.0–100.0)
Platelets: 138 10*3/uL — ABNORMAL LOW (ref 150–400)
Platelets: 160 10*3/uL (ref 150–400)
RBC: 2.54 MIL/uL — ABNORMAL LOW (ref 4.22–5.81)
RBC: 2.59 MIL/uL — ABNORMAL LOW (ref 4.22–5.81)
RDW: 19.5 % — ABNORMAL HIGH (ref 11.5–15.5)
RDW: 19.4 % — ABNORMAL HIGH (ref 11.5–15.5)
WBC: 7.9 10*3/uL (ref 4.0–10.5)
WBC: 9 10*3/uL (ref 4.0–10.5)
nRBC: 0.9 % — ABNORMAL HIGH (ref 0.0–0.2)
nRBC: 1 % — ABNORMAL HIGH (ref 0.0–0.2)

## 2021-07-21 LAB — HEPARIN LEVEL (UNFRACTIONATED)
Heparin Unfractionated: 0.2 IU/mL — ABNORMAL LOW (ref 0.30–0.70)
Heparin Unfractionated: 0.21 IU/mL — ABNORMAL LOW (ref 0.30–0.70)
Heparin Unfractionated: 0.39 IU/mL (ref 0.30–0.70)

## 2021-07-21 LAB — MAGNESIUM: Magnesium: 2.1 mg/dL (ref 1.7–2.4)

## 2021-07-21 LAB — BASIC METABOLIC PANEL
Anion gap: 7 (ref 5–15)
BUN: 23 mg/dL (ref 8–23)
CO2: 23 mmol/L (ref 22–32)
Calcium: 8.1 mg/dL — ABNORMAL LOW (ref 8.9–10.3)
Chloride: 111 mmol/L (ref 98–111)
Creatinine, Ser: 1.28 mg/dL — ABNORMAL HIGH (ref 0.61–1.24)
GFR, Estimated: 57 mL/min — ABNORMAL LOW (ref >60)
Glucose, Bld: 183 mg/dL — ABNORMAL HIGH (ref 70–99)
Potassium: 3.9 mmol/L (ref 3.5–5.1)
Sodium: 141 mmol/L (ref 135–145)

## 2021-07-21 LAB — PHOSPHORUS: Phosphorus: 2.6 mg/dL (ref 2.5–4.6)

## 2021-07-21 MED ORDER — PANTOPRAZOLE SODIUM 40 MG IV SOLR
40.0000 mg | Freq: Two times a day (BID) | INTRAVENOUS | Status: DC
  Administered 2021-07-21 – 2021-07-28 (×15): 40 mg via INTRAVENOUS
  Filled 2021-07-21 (×15): qty 40

## 2021-07-21 MED ORDER — PROPOFOL 1000 MG/100ML IV EMUL: 5.0000 ug/kg/min | INTRAVENOUS | Status: DC

## 2021-07-21 MED ORDER — PROPOFOL 1000 MG/100ML IV EMUL
INTRAVENOUS | Status: AC
  Administered 2021-07-21: 18:00:00 10 ug/kg/min via INTRAVENOUS
  Filled 2021-07-21: qty 100

## 2021-07-21 MED ORDER — FENTANYL CITRATE PF 50 MCG/ML IJ SOSY
25.0000 ug | PREFILLED_SYRINGE | INTRAMUSCULAR | Status: DC | PRN
Start: 2021-07-21 — End: 2021-07-30
  Administered 2021-07-29: 25 ug via INTRAVENOUS
  Administered 2021-07-29 – 2021-07-30 (×2): 50 ug via INTRAVENOUS
  Filled 2021-07-21 (×3): qty 1

## 2021-07-21 NOTE — Progress Notes (Addendum)
STROKE TEAM PROGRESS NOTE   INTERVAL HISTORY Patient is seen in his room with his daughter at bedside.  He is hemodynamically stable without pressors today and cooling protocol has been discontinued.  His neurological exam is unchanged on minimal sedation.  Will obtain MRI today and plan for family meeting this week. Vitals:   07/21/21 0700 07/21/21 0730 07/21/21 0800 07/21/21 0820  BP: (!) 96/52 (!) 99/52 (!) 104/55 (!) 101/53  Pulse: 69 70 72 74  Resp: (!) 28 (!) 28 (!) 22 (!) 28  Temp:  98.2 F (36.8 C) 98.2 F (36.8 C)   TempSrc:  Esophageal Esophageal   SpO2: 99% 98% 96% 96%  Weight:      Height:       CBC:  Recent Labs  Lab 08-15-21 1122 08-15-2021 2014 07/18/21 0523 07/19/21 0455 07/20/21 0512 07/21/21 0611  WBC 9.5  --  16.4*   < > 11.4* 7.9  NEUTROABS 5.4  --  14.0*  --   --   --   HGB 14.9   < > 12.6*   < > 7.9* 7.6*  HCT 46.3   < > 40.4   < > 25.3* 24.6*  MCV 94.3  --  93.7   < > 93.7 96.9  PLT 125*  --  131*   < > 138* 138*   < > = values in this interval not displayed.    Basic Metabolic Panel:  Recent Labs  Lab 07/20/21 0405 07/21/21 0611  NA 140 141  K 3.4* 3.9  CL 110 111  CO2 21* 23  GLUCOSE 206* 183*  BUN 32* 23  CREATININE 1.55* 1.28*  CALCIUM 7.7* 8.1*  MG 2.0 2.1  PHOS 1.9* 2.6    Lipid Panel:  Recent Labs  Lab 07/18/21 0523 07/19/21 0455  CHOL 131  --   TRIG 98 136  HDL 40*  --   CHOLHDL 3.3  --   VLDL 20  --   LDLCALC 71  --     HgbA1c:  Recent Labs  Lab 07/18/21 0523  HGBA1C 7.4*    Urine Drug Screen: No results for input(s): LABOPIA, COCAINSCRNUR, LABBENZ, AMPHETMU, THCU, LABBARB in the last 168 hours.  Alcohol Level  Recent Labs  Lab 15-Aug-2021 1126  ETH <10     IMAGING past 24 hours DG CHEST PORT 1 VIEW  Result Date: 07/20/2021 CLINICAL DATA:  Central line placement. EXAM: PORTABLE CHEST 1 VIEW COMPARISON:  July 18, 2021 FINDINGS: The cardiomediastinal silhouette is unchanged in contour.Low lung volumes.  ETT tip terminates 2.1 cm above the carina. Enteric tube tip and side port project over the distal stomach. No central line visualized per order requisition. Small LEFT pleural effusion. No pneumothorax. LEFT basilar homogeneous opacity, likely atelectasis. Visualized abdomen is unremarkable. Status post vertebral augmentation at the thoracolumbar junction. IMPRESSION: Support apparatus as described above. No central line visualized as per order requisition. Electronically Signed   By: Meda Klinefelter M.D.   On: 07/20/2021 15:11    PHYSICAL EXAM General:  Well-developed elderly intubated male in no acute distress  Respiratory: Respirations even and synchronous with ventilator  Neurological (on minimal sedation with entanyl):   ID:POEUMP 49mm, slightly reactive to light  Bilateral corneal reflexes present, doll's eyes reflex absent.  Cough reflex present.   Motor/Sensory:No response to sternal rub or noxious stimulation to extremties.   ASSESSMENT/PLAN Mr. MATSON WELCH is a 80 y.o. male with history of HTN, anemia, sleep apnea, stroke in 1998, HLD,  eliquis use for DVT in RLE up until 12/18 presenting with right sided weakness and aphasia. Patient presented with aphasia and right hemiplegia and yesterday underwent emergent mechanical thrombectomy for left M1 occlusion with excellent  TICI 3 revascularization but unfortunately last night developed massive pulmonary embolism with low blood pressure developed bradycardic PEA arrest during pulmonary thrombectomy procedure requiring 30 minutes of CPR before  ROSC.  He has remained unresponsive and on full ventilatory support and vasopressor support. Developed massive PE and went to IR overnight 12/21 for a pulmonary arterial thrombectomy with a subsequent PEA arrest in IR.  He was treated with IV TNKase for systemic thrombolysis.  CCM initiated arctic sun for possible hypoxic brian injury and an overnight EEG. Plan for MRI after arctic sun is removed. Poor  neurological exam.  Palliative care consulted to assist family in GOC planning.  Stroke: L MCA infarct at M1 likely secondary source s/p Left M1 thrombectomy with TICI3 revascularization complicated by bilateral pulmonary emboli and PEA arrest Code Stroke- Hyperdense L MCA, chronic small-vessel ischemic changes of white matter. Old thalamic infarction  CTA head & neck-Occlusion of L MCA at mid M1. Mild atherosclerotic plaque.  CT perfusion- Left MCA vascular territory totally , no core infarct  MRI  pending 2D Echo EF 60-65%, mild dilation of ascending aorta, interatrial septum not well visualized Right groin ultrasound done and shows hematoma. LDL 71 HgbA1c 7.4 EEG no seizure activity VTE prophylaxis - SCDs Eliquis (apixaban) daily prior to admission, now on heparin IV.  Therapy recommendations:  pending Disposition:  pending  Left MCA Thrombectomy TICI3 revascularizion of left MCA  Left intubated, unable to maintain O2 sats  Hypotension No longer requiring vasopressors Norepinephrine if needed Home BP medication- metoprolol 50mg   Hyperlipidemia Home meds:  Lovastatin 20mg , Pravastatin 20 mg ordered in hospital LDL 71, goal < 70 Continue statin at discharge  Diabetes type II Controlled HgbA1c 7.4, goal < 7.0 SSI Tube feeding Vital HP   Other Stroke Risk Factors Advanced Age >/= 51  Cigarette smoker,advised to stop smoking ETOH use, alcohol level <10, advised to drink no more than 2 drink(s) a day Substance abuse - UDS:  THC No results found for requested labs within last hours., Cocaine No results found for requested labs within last 76 hours.. Patient advised to stop using due to stroke risk. Obesity, Body mass index is 32.23 kg/m., BMI >/= 30 associated with increased stroke risk, recommend weight loss, diet and exercise as appropriate  Hx stroke/TIA Coronary artery disease Migraines Obstructive sleep apnea, on CPAP at home Congestive heart  failure  Other Active Problems PEA Arrest  During bilateral pulmonary artery thrombectomy 30 minutes of CPR before ROSC Initiation of Arctic Sun by CCM Hypoxic injury?  Bilateral Pulmonary Embolism Bilateral pulmonary artery aspiration  TNK IVP during thrombectomy Respiratory Failure Mechanical ventilation Sedation with Propofol, fentanyl Management by CCM- appreciate assistance  Hospital day # 4  Patient seen and examined by NP/APP with MD. MD to update note as needed.   Cortney E [id number] , MSN, AGACNP-BC [facility] See Amion for schedule and pager information 07/21/2021 11:12 AM  ATTENDING ATTESTATION:  B/l blink to threat, corneal response present but no motor movements on minimal sedation. Con't to wean sedation. MRI today hopefully. Low Hg-CCM getting CT A/P to r/o bleed as he is on heparin ggt. His wife mentioned he recently had a colonoscopy oupt and there was some concern for polyp or abnormal gastric vein. Prognosis is poor.  Dr. Viviann Spare evaluated pt independently, reviewed imaging, chart, labs. Discussed and formulated plan with the APP. Please see APP note above for details.     This patient is critically ill due to respiratory distress, stroke and at significant risk of neurological worsening, death form heart failure, respiratory failure, recurrent stroke, bleeding from [facility], seizure, sepsis. This patient's care requires constant monitoring of vital signs, hemodynamics, respiratory and cardiac monitoring, review of multiple databases, neurological assessment, discussion with family, other specialists and medical decision making of high complexity. I spent 35 minutes of neurocritical care time in the care of this patient.   Josecarlos Harriott,MD   To contact Stroke Continuity provider, please refer to [email or web address]. After hours, contact General Neurology

## 2021-07-21 NOTE — Progress Notes (Signed)
NAME:  Jaime Whitaker, MRN:  [id number], DOB:  [birth year], LOS: 4 ADMISSION DATE:  08/16/2021, CONSULTATION DATE:  Aug 02, 2021 REFERRING MD:  Corliss Skains, CHIEF COMPLAINT:  weakness   History of Present Illness:  80yM on eliquis for ?recently discovered DVT, IDA, PMR, OSA, CVA in 1994. History is obtained through chart review and discussion with the patient's wife. He was planning to undergo EGD for intervention on AVM felt to be potential culprit for IDA today and had held eliquis since 12/18. Today had R sided weakness, aphasia and gaze deviation.  He was also noted to be hypoxic in ED requiring NRB. Found to have L M1 occlusion now s/p aspiration with TICI 3 revascularization. Remained hypoxic after reversal of NMB, left intubated and arrived to neuro ICU on 100 of neo, 50 of propofol, saline infusion at 75 mL/hr  Pertinent  Medical History  DVT IDA PMR OSA CVA  Significant Hospital Events: Including procedures, antibiotic start and stop dates in addition to other pertinent events   08/16/2021 intubated, mechanical thrombectomy L M1 with TICI 3 revascularization 12/22 mechanical thrombectomy c/b 30 min in and out of PEA arrest, systemic TNK for PE. Made DNR after discussion with family. Normothermia protocol initiated 12/24 left femoral central line placed  Interim History / Subjective:  No acute issues. Palliative care involved. No major exam changes.  Objective   Blood pressure (!) 101/53, pulse 74, temperature 98.2 F (36.8 C), temperature source Esophageal, resp. rate (!) 28, height 5\' 9"  (1.753 m), weight 99 kg, SpO2 96 %.    Vent Mode: PRVC FiO2 (%):  [40 %] 40 % Set Rate:  [28 bmp] 28 bmp Vt Set:  [560 mL] 560 mL PEEP:  [5 cmH20] 5 cmH20 Plateau Pressure:  [17 cmH20-26 cmH20] 22 cmH20   Intake/Output Summary (Last 24 hours) at 07/21/2021 1002 Last data filed at 07/21/2021 0800 Gross per 24 hour  Intake 3646.21 ml  Output 800 ml  Net 2846.21 ml   Filed Weights    August 02, 2021 1126 08/17/2021 1550 07/21/21 0500  Weight: 104.3 kg 98.9 kg 99 kg    Examination: General appearance: 80 y.o., male, intubated Eyes: pupils again equal very sluggishly reactive if at all HENT: NCAT; MMM Neck: Trachea midline; no lymphadenopathy, no JVD Lungs: rhonchorous bl, equal chest rise CV: RRR, no murmur  Abdomen: Soft, non-tender; non-distended, hypoactive BS present  Extremities: 1+ peripheral edema, warm Skin: Normal turgor and texture; no rash Neuro: does not withdraw or posture to noxious stimuli, does not breathe over vent, +gag    Resolved Hospital Problem list     Assessment & Plan:   # Left M1 occlusion s/p thrombectomy: - MR brain wo today - frequent neuro checks - SBP 120-140 - ASA 81 daily - PT/OT/speech eventually - stroke team following  # Acute hypoxic respiratory failure: # History of OSA PE and LLL occlusion/secretions. - position left lung up - increase CPT  - full vent support with lung protective settings, can ideally keep peep < 10 in setting acute neurologic injury - sedation goal RASS -2 to -3  - s/p thrombectomy, systemic TNK and now on heparin gtt  # Acute anemia - CT CAP - trend CBC  # PEA arrest Due to PE, s/p systemic TNK - dc normothermia protocol - MRI today - watch for seizure activity  # Hypotension: May be combination of post arrest vasoplegia, PE. - levo off   # PMR - prednisone 5 mg daily  # Non-oliguric AKI vs  CKD: - limit nephrotoxic medications - CTM  # DVT - heparin gtt as above   Best Practice (right click and "Reselect all SmartList Selections" daily)   Diet/type: tubefeeds DVT prophylaxis: systemic heparin GI prophylaxis: PPI Lines: Central line Foley:  Yes, and it is still needed Code Status:  full code Last date of multidisciplinary goals of care discussion [Palliative care involved, appreciate assistance. Made DNR after discussion post arrest 12/22. Family remains hopeful for recovery  however. ]  Critical care time: 33 minutes

## 2021-07-21 NOTE — Progress Notes (Signed)
Pt transported to MRI & CT without complications. RT will continue to monitor.

## 2021-07-21 NOTE — Progress Notes (Signed)
ANTICOAGULATION CONSULT NOTE - Follow Up Consult  Pharmacy Consult for heparin Indication: pulmonary embolus  Labs: Recent Labs    07/18/21 0523 07/18/21 1549 07/18/21 2345 07/19/21 0455 07/19/21 0919 07/20/21 0405 07/20/21 0512 07/20/21 1559 07/21/21 0041  HGB 12.6*  --   --  10.8*  --   --  7.9*  --   --   HCT 40.4  --   --  34.1*  --   --  25.3*  --   --   PLT 131*  --   --  126*  --   --  138*  --   --   APTT 40* 82*  --   --   --   --   --   --   --   LABPROT 19.0*  --   --   --   --   --   --   --   --   INR 1.6*  --   --   --   --   --   --   --   --   HEPARINUNFRC  --  0.38   < >  --    < > 0.30  --  <0.10* 0.20*  CREATININE 2.00*  --   --  1.87*  --  1.55*  --   --   --    < > = values in this interval not displayed.    Assessment/Plan:  80yo male subtherapeutic on heparin after resuming but was previously therapeutic, heparin may continue to accumulate. Will continue infusion at current rate of 1400 units/hr and check additional level.   Vernard Gambles, PharmD, BCPS  07/21/2021,2:14 AM

## 2021-07-21 NOTE — Progress Notes (Signed)
ANTICOAGULATION CONSULT NOTE  Pharmacy Consult for Heparin Indication: Acute bilateral PEs and history of DVT  No Known Allergies  Patient Measurements: Height: 5\' 9"  (175.3 cm) Weight: 99 kg (218 lb 4.1 oz) IBW/kg (Calculated) : 70.7  Heparin Dosing Weight: 91 kg  Vital Signs: Temp: 98.7 F (37.1 C) (12/25 1600) Temp Source: Axillary (12/25 1600) BP: 116/58 (12/25 1800) Pulse Rate: 65 (12/25 1800)  Labs: Recent Labs    07/19/21 0455 07/19/21 0919 07/20/21 0405 07/20/21 0512 07/20/21 1559 07/21/21 0041 07/21/21 0611 07/21/21 0903 07/21/21 1748 07/21/21 1800  HGB 10.8*  --   --  7.9*  --   --  7.6*  --  7.7*  --   HCT 34.1*  --   --  25.3*  --   --  24.6*  --  25.0*  --   PLT 126*  --   --  138*  --   --  138*  --  160  --   HEPARINUNFRC  --    < > 0.30  --    < > 0.20*  --  0.21*  --  0.39  CREATININE 1.87*  --  1.55*  --   --   --  1.28*  --   --   --    < > = values in this interval not displayed.     Estimated Creatinine Clearance: 55.2 mL/min (A) (by C-G formula based on SCr of 1.28 mg/dL (H)).  Assessment: 60 YOM presented with right-sided weakness and aphagia, underwent revascularization of L MCA.  Also found to have acute bilateral PEs in setting of recent DVT, so Pharmacy consulted to dose IV heparin. He was on Eliquis PTA, but taken off on 12/18 for EGD.   Patient taken to IR 12/21 pm for PE thrombectomy and unable to aspirate significant clot. Patient with loss of pulses and coded in IR. Pt with multiple relative contraindications to lytics but decision made to give tenecteplase 12/22 ~0100.  Heparin resumed 12/22.   Heparin level is now therapeutic on 1600 units/hr. Hgb significantly dropped 12/23 from 10.8 > 7.9 now 7.6, plt 138. CT chest/abd with no evidence of retroperitoneal hematoma or other sources of blood loss but per RN, patient had a dark bowel movement today. CCM was made aware and they wanted to keep heparin running for now. MRI head showed no  s/s of bleed.    Goal of Therapy:  Heparin level 0.3-0.5 units/ml Monitor platelets by anticoagulation protocol: Yes  Plan:  Continue heparin infusion at 1600 units/hr Check heparin level in 8 hours and daily while on heparin Continue to monitor H&H and platelets    Thank you for allowing pharmacy to be a part of this patients care.  1/24, PharmD., BCPS, BCCCP Clinical Pharmacist Please refer to [facility] for unit-specific pharmacist

## 2021-07-21 NOTE — Progress Notes (Signed)
ANTICOAGULATION CONSULT NOTE  Pharmacy Consult for Heparin Indication: Acute bilateral PEs and history of DVT  No Known Allergies  Patient Measurements: Height: 5\' 9"  (175.3 cm) Weight: 99 kg (218 lb 4.1 oz) IBW/kg (Calculated) : 70.7  Heparin Dosing Weight: 91 kg  Vital Signs: Temp: 98.2 F (36.8 C) (12/25 0800) Temp Source: Esophageal (12/25 0800) BP: 101/53 (12/25 0820) Pulse Rate: 74 (12/25 0820)  Labs: Recent Labs    07/18/21 1549 07/18/21 2345 07/19/21 0455 07/19/21 0919 07/20/21 0405 07/20/21 0512 07/20/21 1559 07/21/21 0041 07/21/21 0611 07/21/21 0903  HGB  --    < > 10.8*  --   --  7.9*  --   --  7.6*  --   HCT  --   --  34.1*  --   --  25.3*  --   --  24.6*  --   PLT  --   --  126*  --   --  138*  --   --  138*  --   APTT 82*  --   --   --   --   --   --   --   --   --   HEPARINUNFRC 0.38   < >  --    < > 0.30  --  <0.10* 0.20*  --  0.21*  CREATININE  --   --  1.87*  --  1.55*  --   --   --  1.28*  --    < > = values in this interval not displayed.    Estimated Creatinine Clearance: 55.2 mL/min (A) (by C-G formula based on SCr of 1.28 mg/dL (H)).  Assessment: 38 YOM presented with right-sided weakness and aphagia, underwent revascularization of L MCA.  Also found to have acute bilateral PEs in setting of recent DVT, so Pharmacy consulted to dose IV heparin. He was on Eliquis PTA, but taken off on 12/18 for EGD.   Patient taken to IR 12/21 pm for PE thrombectomy and unable to aspirate significant clot. Patient with loss of pulses and coded in IR. Pt with multiple relative contraindications to lytics but decision made to give tenecteplase 12/22 ~0100.  Heparin resumed 12/22.   Heparin level remains subtherapeutic at 0.2 and 0.21 respectively. Hgb significantly dropped 12/23 from 10.8 > 7.9 now 7.6, plt 138. No s/sx of bleeding or infusion issues. Will increase heparin to 1600 units/hr with no bolus.   Goal of Therapy:  Heparin level 0.3-0.5  units/ml Monitor platelets by anticoagulation protocol: Yes  Plan:  Increase heparin infusion to 1600 units/hr Check heparin level in 8 hours and daily while on heparin Continue to monitor H&H and platelets    Thank you for allowing pharmacy to be a part of this patients care.  1/24, PharmD Clinical Pharmacist

## 2021-07-22 DIAGNOSIS — J9602 Acute respiratory failure with hypercapnia: Secondary | ICD-10-CM | POA: Diagnosis not present

## 2021-07-22 DIAGNOSIS — R578 Other shock: Secondary | ICD-10-CM

## 2021-07-22 DIAGNOSIS — I63412 Cerebral infarction due to embolism of left middle cerebral artery: Secondary | ICD-10-CM | POA: Diagnosis not present

## 2021-07-22 DIAGNOSIS — Z01818 Encounter for other preprocedural examination: Secondary | ICD-10-CM

## 2021-07-22 DIAGNOSIS — I6602 Occlusion and stenosis of left middle cerebral artery: Secondary | ICD-10-CM | POA: Diagnosis not present

## 2021-07-22 DIAGNOSIS — J9601 Acute respiratory failure with hypoxia: Secondary | ICD-10-CM | POA: Diagnosis not present

## 2021-07-22 DIAGNOSIS — I63512 Cerebral infarction due to unspecified occlusion or stenosis of left middle cerebral artery: Secondary | ICD-10-CM | POA: Diagnosis not present

## 2021-07-22 LAB — BASIC METABOLIC PANEL
Anion gap: 7 (ref 5–15)
BUN: 22 mg/dL (ref 8–23)
CO2: 21 mmol/L — ABNORMAL LOW (ref 22–32)
Calcium: 8 mg/dL — ABNORMAL LOW (ref 8.9–10.3)
Chloride: 112 mmol/L — ABNORMAL HIGH (ref 98–111)
Creatinine, Ser: 1.16 mg/dL (ref 0.61–1.24)
GFR, Estimated: >60 mL/min (ref >60)
Glucose, Bld: 218 mg/dL — ABNORMAL HIGH (ref 70–99)
Potassium: 4.1 mmol/L (ref 3.5–5.1)
Sodium: 140 mmol/L (ref 135–145)

## 2021-07-22 LAB — CBC
HCT: 24.3 % — ABNORMAL LOW (ref 39.0–52.0)
HCT: 24.5 % — ABNORMAL LOW (ref 39.0–52.0)
HCT: 27.6 % — ABNORMAL LOW (ref 39.0–52.0)
Hemoglobin: 7.4 g/dL — ABNORMAL LOW (ref 13.0–17.0)
Hemoglobin: 7.6 g/dL — ABNORMAL LOW (ref 13.0–17.0)
Hemoglobin: 8.2 g/dL — ABNORMAL LOW (ref 13.0–17.0)
MCH: 29.5 pg (ref 26.0–34.0)
MCH: 30.5 pg (ref 26.0–34.0)
MCH: 29 pg (ref 26.0–34.0)
MCHC: 30.5 g/dL (ref 30.0–36.0)
MCHC: 31 g/dL (ref 30.0–36.0)
MCHC: 29.7 g/dL — ABNORMAL LOW (ref 30.0–36.0)
MCV: 96.8 fL (ref 80.0–100.0)
MCV: 98.4 fL (ref 80.0–100.0)
MCV: 97.5 fL (ref 80.0–100.0)
Platelets: 164 10*3/uL (ref 150–400)
Platelets: 167 10*3/uL (ref 150–400)
Platelets: 211 10*3/uL (ref 150–400)
RBC: 2.51 MIL/uL — ABNORMAL LOW (ref 4.22–5.81)
RBC: 2.49 MIL/uL — ABNORMAL LOW (ref 4.22–5.81)
RBC: 2.83 MIL/uL — ABNORMAL LOW (ref 4.22–5.81)
RDW: 19.6 % — ABNORMAL HIGH (ref 11.5–15.5)
RDW: 19.6 % — ABNORMAL HIGH (ref 11.5–15.5)
RDW: 19.4 % — ABNORMAL HIGH (ref 11.5–15.5)
WBC: 7.9 10*3/uL (ref 4.0–10.5)
WBC: 7.7 10*3/uL (ref 4.0–10.5)
WBC: 6.8 10*3/uL (ref 4.0–10.5)
nRBC: 1.5 % — ABNORMAL HIGH (ref 0.0–0.2)
nRBC: 1.6 % — ABNORMAL HIGH (ref 0.0–0.2)
nRBC: 1.8 % — ABNORMAL HIGH (ref 0.0–0.2)

## 2021-07-22 LAB — TRIGLYCERIDES: Triglycerides: 125 mg/dL (ref <150)

## 2021-07-22 LAB — GLUCOSE, CAPILLARY
Glucose-Capillary: 150 mg/dL — ABNORMAL HIGH (ref 70–99)
Glucose-Capillary: 156 mg/dL — ABNORMAL HIGH (ref 70–99)
Glucose-Capillary: 160 mg/dL — ABNORMAL HIGH (ref 70–99)
Glucose-Capillary: 166 mg/dL — ABNORMAL HIGH (ref 70–99)
Glucose-Capillary: 168 mg/dL — ABNORMAL HIGH (ref 70–99)
Glucose-Capillary: 169 mg/dL — ABNORMAL HIGH (ref 70–99)
Glucose-Capillary: 171 mg/dL — ABNORMAL HIGH (ref 70–99)
Glucose-Capillary: 171 mg/dL — ABNORMAL HIGH (ref 70–99)
Glucose-Capillary: 178 mg/dL — ABNORMAL HIGH (ref 70–99)
Glucose-Capillary: 182 mg/dL — ABNORMAL HIGH (ref 70–99)
Glucose-Capillary: 185 mg/dL — ABNORMAL HIGH (ref 70–99)
Glucose-Capillary: 189 mg/dL — ABNORMAL HIGH (ref 70–99)
Glucose-Capillary: 204 mg/dL — ABNORMAL HIGH (ref 70–99)
Glucose-Capillary: 211 mg/dL — ABNORMAL HIGH (ref 70–99)
Glucose-Capillary: 216 mg/dL — ABNORMAL HIGH (ref 70–99)
Glucose-Capillary: 220 mg/dL — ABNORMAL HIGH (ref 70–99)
Glucose-Capillary: 224 mg/dL — ABNORMAL HIGH (ref 70–99)
Glucose-Capillary: 201 mg/dL — ABNORMAL HIGH (ref 70–99)

## 2021-07-22 LAB — HEPARIN LEVEL (UNFRACTIONATED): Heparin Unfractionated: 0.42 IU/mL (ref 0.30–0.70)

## 2021-07-22 MED ORDER — FUROSEMIDE 10 MG/ML IJ SOLN
60.0000 mg | Freq: Once | INTRAMUSCULAR | Status: AC
Start: 2021-07-22 — End: 2021-07-22
  Administered 2021-07-22: 10:00:00 60 mg via INTRAVENOUS
  Filled 2021-07-22: qty 6

## 2021-07-22 MED ORDER — CHLORHEXIDINE GLUCONATE 0.12 % MT SOLN
OROMUCOSAL | Status: AC
  Administered 2021-07-22: 09:00:00 15 mL
  Filled 2021-07-22: qty 15

## 2021-07-22 NOTE — Progress Notes (Addendum)
STROKE TEAM PROGRESS NOTE   INTERVAL HISTORY Patient seen in room with daughter at the bedside. Extensive discussion about MRI results with daughter and son, who was on the phone. MRI shows extensive involvement of the left cerebral hemisphere in the left MCA territory, left PCA territory and left watershed territories. Involvement of multiple vascular territories raises the possibility of an embolic process. Superimposed acute hypoxic/ischemic injury cannot be excluded.  Neurological exam unchanged. Tentative plan for a family meeting tomorrow.   Vitals:   07/22/21 1100 07/22/21 1158 07/22/21 1200 07/22/21 1300  BP: (!) 149/67 132/66 132/66 136/69  Pulse: 78 77 78 77  Resp: 20 (!) 22 (!) 23 (!) 21  Temp:  99.8 F (37.7 C)    TempSrc:  Axillary    SpO2: 100% 100% 100% 100%  Weight:      Height:       CBC:  Recent Labs  Lab 2021/08/01 1122 08/01/21 2014 07/18/21 0523 07/19/21 0455 07/22/21 0148 07/22/21 0855  WBC 9.5  --  16.4*   < > 7.9 7.7  NEUTROABS 5.4  --  14.0*  --   --   --   HGB 14.9   < > 12.6*   < > 7.4* 7.6*  HCT 46.3   < > 40.4   < > 24.3* 24.5*  MCV 94.3  --  93.7   < > 96.8 98.4  PLT 125*  --  131*   < > 164 167   < > = values in this interval not displayed.    Basic Metabolic Panel:  Recent Labs  Lab 07/20/21 0405 07/21/21 0611 07/22/21 0526  NA 140 141 140  K 3.4* 3.9 4.1  CL 110 111 112*  CO2 21* 23 21*  GLUCOSE 206* 183* 218*  BUN 32* 23 22  CREATININE 1.55* 1.28* 1.16  CALCIUM 7.7* 8.1* 8.0*  MG 2.0 2.1  --   PHOS 1.9* 2.6  --     Lipid Panel:  Recent Labs  Lab 07/18/21 0523 07/19/21 0455 07/22/21 0526  CHOL 131  --   --   TRIG 98   < > 125  HDL 40*  --   --   CHOLHDL 3.3  --   --   VLDL 20  --   --   LDLCALC 71  --   --    < > = values in this interval not displayed.    HgbA1c:  Recent Labs  Lab 07/18/21 0523  HGBA1C 7.4*    Urine Drug Screen: No results for input(s): LABOPIA, COCAINSCRNUR, LABBENZ, AMPHETMU, THCU, LABBARB in  the last 168 hours.  Alcohol Level  Recent Labs  Lab Aug 01, 2021 1126  ETH <10     IMAGING past 24 hours MR BRAIN WO CONTRAST  Result Date: 07/21/2021 CLINICAL DATA:  Stroke, follow-up. EXAM: MRI HEAD WITHOUT CONTRAST TECHNIQUE: Multiplanar, multiecho pulse sequences of the brain and surrounding structures were obtained without intravenous contrast. COMPARISON:  Prior head CT examinations 07/19/2021 and earlier. CTA head/neck 08/01/21. FINDINGS: Brain: Cerebral volume appears normal for age. Extensive acute/early subacute cortical and subcortical infarcts within the left cerebral hemisphere, within the left MCA territory, left PCA territory and left watershed territories. Patchy acute/early subacute cortical/subcortical infarcts are also present within the right cerebral hemisphere, predominantly within a watershed distribution and within the right PCA vascular territory. Punctate acute/early subacute infarct within the medial right frontal lobe (ACA vascular territory) (series 2, image 34). Multiple small acute/early subacute infarcts are also present  within the bilateral deep gray nuclei/internal capsules and within the callosal splenium. Extensive patchy acute/early subacute infarcts within the bilateral cerebellar hemispheres, and within the cerebellar vermis. Background mild multifocal T2 FLAIR hyperintense signal abnormality within the cerebral white matter, nonspecific but compatible with chronic small vessel ischemic disease. Background chronic lacunar infarcts within the bilateral thalami. There are a few scattered supratentorial chronic parenchymal microhemorrhages. No evidence of an intracranial mass. No extra-axial fluid collection. No midline shift. Vascular: SWI signal loss along the left parietal lobe, suspicious for thromboembolism within a distal left MCA vessel. Skull and upper cervical spine: No focal suspicious marrow lesion. Sinuses/Orbits: Visualized orbits show no acute finding.  Bilateral lens replacements. Mild mucosal thickening and fluid scattered within the bilateral ethmoid sinuses. Fluid levels, and background mild mucosal thickening, within the bilateral sphenoid sinuses. Fluid level and mild mucosal thickening, within the left maxillary sinus. Mild mucosal thickening within the right maxillary sinus. Other: Small-volume fluid within the bilateral mastoid air cells. IMPRESSION: Overall extensive acute/early subacute infarcts within the supratentorial and infratentorial brain, as detailed. There is greatest involvement of the left cerebral hemisphere in the left MCA territory, left PCA territory and left watershed territories. Involvement of multiple vascular territories raises the possibility of an embolic process. Superimposed acute hypoxic/ischemic injury cannot be excluded. Background mild chronic small vessel ischemic changes, as described. Curvilinear SWI signal loss along the left parietal lobe suspicious for thromboembolism within a distal left MCA vessel. Paranasal sinus disease, as described. Small-volume fluid within the bilateral mastoid air cells. Electronically Signed   By: Jackey Loge D.O.   On: 07/21/2021 16:09   CT CHEST ABDOMEN PELVIS WO CONTRAST  Result Date: 07/21/2021 CLINICAL DATA:  Concern for hematoma, anemia, drop in hemoglobin, recent stroke EXAM: CT CHEST, ABDOMEN AND PELVIS WITHOUT CONTRAST TECHNIQUE: Multidetector CT imaging of the chest, abdomen and pelvis was performed following the standard protocol without IV contrast. COMPARISON:  12/30/2020 FINDINGS: CT CHEST FINDINGS Cardiovascular: Aortic atherosclerosis. Normal heart size. Left coronary artery calcifications. No pericardial effusion. Mediastinum/Nodes: No enlarged mediastinal, hilar, or axillary lymph nodes. Thyroid gland, trachea, and esophagus demonstrate no significant findings. Lungs/Pleura: Endotracheal intubation, tube tip in the midtrachea. There is extensive dependent bibasilar  atelectasis or consolidation of the lower lobes and small associated pleural effusions. Musculoskeletal: No chest wall mass or suspicious bone lesions identified. CT ABDOMEN PELVIS FINDINGS Hepatobiliary: No solid liver abnormality is seen. Hepatomegaly, maximum coronal span 22.3 cm. No gallstones, gallbladder wall thickening, or biliary dilatation. Pancreas: Unremarkable. No pancreatic ductal dilatation or surrounding inflammatory changes. Spleen: Normal in size without significant abnormality. Adrenals/Urinary Tract: Adrenal glands are unremarkable. Multiple small bilateral nonobstructive renal calculi. No ureteral calculi or hydronephrosis. Foley catheter in the urinary bladder. Stomach/Bowel: Stomach is within normal limits. Appendix appears normal. No evidence of bowel wall thickening, distention, or inflammatory changes. Pancolonic diverticulosis. Fat stranding about the proximal to mid sigmoid colon is similar to prior examination (series 3, image 85). Vascular/Lymphatic: Aortic atherosclerosis. Partially imaged left femoral venous catheter. No enlarged abdominal or pelvic lymph nodes. Reproductive: No mass or other abnormality. Other: Anasarca. Trace ascites in the left lower quadrant. Soft tissue stranding over the left and right groin, consistent with femoral vascular access. Musculoskeletal: No acute osseous findings. Multiple wedge deformities of the thoracic and lumbar spine status post vertebral cement augmentation. IMPRESSION: 1. No noncontrast evidence of retroperitoneal hematoma or other findings to suggest source of blood loss. 2. Extensive dependent bibasilar atelectasis or consolidation of the lower lobes of the  lungs and small associated pleural effusions. 3. Pancolonic diverticulosis. Fat stranding about the proximal to mid sigmoid colon is similar to prior examination and most likely reflect chronic sequelae of diverticulitis. 4. Hepatomegaly. 5. Bilateral nonobstructive nephrolithiasis. 6.  Anasarca. 7. Coronary artery disease. Aortic Atherosclerosis (ICD10-I70.0). Electronically Signed   By: Jearld Lesch M.D.   On: 07/21/2021 16:40    PHYSICAL EXAM General - Well nourished, well developed, intubated off sedation. Ophthalmologic - fundi not visualized due to noncooperation. Cardiovascular - Regular rate and rhythm.   Neuro - intubated off sedation, eyes closed, minimally open to voice not following commands.  With forced eye opening, eyes in mid position, inconsistently blinking to visual threat on the left but not on the right, doll's eyes present, not tracking, b/l pupil 1.33mm, sluggish to light. Corneal reflex weakly present, left more reactive than right, gag and cough present.  Breathing over the vent.   Facial symmetry not able to test due to ET tube.   Tongue protrusion not cooperative.  On pain stimulation, slight movement on the right UE and LE but not consistent on repeated testing.  Right positive babinski. Sensation, coordination and gait not tested.   ASSESSMENT/PLAN Jaime Whitaker is a 80 y.o. male with history of HTN, anemia, sleep apnea, stroke in 1998, HLD, eliquis use for DVT in RLE up until 12/18 presenting with right sided weakness and aphasia. Patient presented with aphasia and right hemiplegia and yesterday underwent emergent mechanical thrombectomy for left M1 occlusion with excellent  TICI 3 revascularization but unfortunately last night developed massive pulmonary embolism with low blood pressure developed bradycardic PEA arrest during pulmonary thrombectomy procedure requiring 30 minutes of CPR before  ROSC.  He has remained unresponsive and on full ventilatory support and vasopressor support. Developed massive PE and went to IR overnight 12/21 for a pulmonary arterial thrombectomy with a subsequent PEA arrest in IR.  He was treated with IV TNKase for systemic thrombolysis.  CCM initiated arctic sun for possible hypoxic brian injury and an overnight EEG.  MRI after completion of hypothermia shows extensive involvement of the left cerebral hemisphere in the left MCA territory, left PCA territory and left watershed territories. Involvement of multiple vascular territories raises the possibility of an embolic process. Superimposed acute hypoxic/ischemic injury cannot be excluded.    Stroke: embolic shower but largest at left MCA with L M1 occlusion s/p IR with Left M1 TICI3 but complicated by bilateral PE and PEA arrest s/p  IV TNK and unsuccessful thrombectomy Code Stroke- Hyperdense L MCA, chronic small-vessel ischemic changes of white matter. Old thalamic infarction  CTA head & neck-Occlusion of L MCA at mid M1. Mild atherosclerotic plaque.  CT perfusion- Left MCA vascular territory totally , no core infarct  MRI  Extensive acute/subacute infarcts within the supratentorial and infratentorial brain, greatest involvement of L MCA, L PCA, and L watershed territory. Embolic with hypoxic/ischemic injury 2D Echo EF 60-65%, mild dilation of ascending aorta, interatrial septum not well visualized Right groin ultrasound done and shows hematoma. LDL 71 HgbA1c 7.4 EEG no seizure activity VTE prophylaxis - SCDs Eliquis (apixaban) daily but off for colonoscopy prior to admission, now on heparin IV.  Therapy recommendations:  pending Disposition:  pending, poor prognosis, discussed with daughter, will setup family meeting tomorrow  B/l PE s/p  IV TNK and unsuccessful thrombectomy PEA arrest during bilateral pulmonary artery thrombectomy Hypotension with bradycardia CPR 74m before ROSC Initiation of Arctic Sun by CCM MRI showed evidence of anoxic brain  injury Poor prognosis family meeting tomorrow  Respiratory failure Intubated for cardiac arrest resuscitation On vent Not candidate for extubation Vent management per CCM  Hypotension Likely due to PEA arrest Was on levophed, now no longer requiring vasopressors Home BP medication- metoprolol  50mg   Hyperlipidemia Home meds:  Lovastatin 20mg , Pravastatin 20 mg ordered in hospital LDL 71, goal < 70 Continue statin at discharge  Diabetes type II uncontrolled HgbA1c 7.4, goal < 7.0 SSI CBG monitoring  Tobacco abuse Current smoker Smoking cessation counseling will be provided  Other Stroke Risk Factors Advanced Age >/= 109  Obesity, Body mass index is 32.23 kg/m., BMI >/= 30 associated with increased stroke risk, recommend weight loss, diet and exercise as appropriate  Hx of stroke in 1998 - no details Hx of DVT on eliquis Obstructive sleep apnea, on CPAP at home  Other Active Problems Acute blood loss anemia - Hb 7.6-> 8.2.  Pan CT showed no retroperitoneal hematoma.  Continue monitoring. AKI creatinine 1.55-> 1.16 Leukocytosis WBC 11.4-> 7.7-> 6.8   Hospital day # 5  Patient seen and examined by NP/APP with MD. MD to update note as needed.   76, DNP, FNP-BC [facility] Pager: [phone]  ATTENDING NOTE: I reviewed above note and agree with the assessment and plan. Pt was seen and examined.   80 year old male with history of hypertension, hyperlipidemia, OSA, stroke in 1988, DVT on Eliquis and smoker admitted for right-sided weakness, aphasia CT showed left MCA hyperdense sign, and old thalamic infarct.  CTA head and neck showed left M1 occlusion.  CTP 0/118 positive penumbra.  Outside TNK window for thrombolysis.  Status post IR with TICI3 reperfusion.  However, later developed bilateral PE, hypotension, bradycardia.  During the procedure for pulmonary artery thrombectomy, patient developed PEA arrest, had CPR 30 minutes before ROSC.  Also received IV TNK.  Patient was later put on Arctic sun for hyperthermia and long-term EEG showed no seizure.  MRI showed embolic shower especially more on the left MCA large infarct as well as evidence of anoxic brain injury.  EF 60 to 65%.  LDL 71, A1c 7.4.   Temp:  [98.6 F (37 C)-100.1 F (37.8 C)]  99.8 F (37.7 C) (12/26 1158) Pulse Rate:  [59-88] 77 (12/26 1300) Resp:  [16-28] 21 (12/26 1300) BP: (101-149)/(51-80) 136/69 (12/26 1300) SpO2:  [94 %-100 %] 100 % (12/26 1300) FiO2 (%):  [40 %] 40 % (12/26 0822)   General - Well nourished, well developed, intubated off sedation.   Ophthalmologic - fundi not visualized due to noncooperation.   Cardiovascular - Regular rate and rhythm.   Neuro - intubated off sedation, eyes close, barely open on voice, not following commands. With forced eye opening, eyes in mid position, inconsistently blinking to visual threat on the left but not on the right, doll's eyes present, not tracking, b/l pupil 1.54mm, sluggish to light. Corneal reflex weakly present, left more reactive than right, gag and cough present. Breathing over the vent.  Facial symmetry not able to test due to ET tube.  Tongue protrusion not cooperative. On pain stimulation, slight movement on the right UE and LE but not consistent on repeated testing. Right positive babinski. Sensation, coordination and gait not tested.  Patient currently on heparin IV and aspirin 81 as well as pravastatin 20.  On tube feeding.  Etiology for patient stroke involving pattern, source unclear but likely related to hypercoagulable state given history of DVT and recurrent PE.  However, with extensive stroke  and anoxic brain injury on MRI, patient poor neuro exam without sedation, likely poor prognosis.  Discussed with daughter at bedside, she would like to set up family meeting tomorrow for further GOC discussion.  For detailed assessment and plan, please refer to above as I have made changes wherever appropriate.   Marvel Plan, MD PhD Stroke Neurology 07/22/2021 10:46 PM  This patient is critically ill due to embolic shower with large left MCA infarct, respiratory failure, cardiac arrest, bilateral PE, bradycardia and at significant risk of neurological worsening, death form recurrent stroke, recurrent  cardio arrest, heart failure, pulmonary hypertension, seizure. This patient's care requires constant monitoring of vital signs, hemodynamics, respiratory and cardiac monitoring, review of multiple databases, neurological assessment, discussion with family, other specialists and medical decision making of high complexity. I spent 45 minutes of neurocritical care time in the care of this patient. I had long discussion with daughter at bedside and son over the phone, updated pt current condition, treatment plan and potential prognosis, and answered all the questions.  They expressed understanding and appreciation.      To contact Stroke Continuity provider, please refer to [email or web address]. After hours, contact General Neurology

## 2021-07-22 NOTE — Progress Notes (Signed)
NAME:  DIANE MOCHIZUKI, MRN:  [id number], DOB:  [birth year], LOS: 5 ADMISSION DATE:  07/13/2021, CONSULTATION DATE:  07/19/2021 REFERRING MD:  Corliss Skains, CHIEF COMPLAINT:  weakness   History of Present Illness:  80yM on eliquis for ?recently discovered DVT, IDA, PMR, OSA, CVA in 1994. History is obtained through chart review and discussion with the patient's wife. He was planning to undergo EGD for intervention on AVM felt to be potential culprit for IDA today and had held eliquis since 12/18. Today had R sided weakness, aphasia and gaze deviation.  He was also noted to be hypoxic in ED requiring NRB. Found to have L M1 occlusion now s/p aspiration with TICI 3 revascularization. Remained hypoxic after reversal of NMB, left intubated and arrived to neuro ICU on 100 of neo, 50 of propofol, saline infusion at 75 mL/hr  Pertinent  Medical History  DVT IDA PMR OSA CVA  Significant Hospital Events: Including procedures, antibiotic start and stop dates in addition to other pertinent events   07/26/2021 intubated, mechanical thrombectomy L M1 with TICI 3 revascularization 12/22 mechanical thrombectomy c/b 30 min in and out of PEA arrest, systemic TNK for PE. Made DNR after discussion with family. Normothermia protocol initiated 12/24 left femoral central line placed 12/25 GI bleeding but HDS and has known AVM continuing heparin, MR brain with extensive ischemic burden  Interim History / Subjective:  No further GI bleeding. Hb relatively stable and not on pressors. No major changes in exam  Objective   Blood pressure 122/60, pulse 68, temperature 100.1 F (37.8 C), temperature source Axillary, resp. rate (!) 28, height 5\' 9"  (1.753 m), weight 99 kg, SpO2 100 %.    Vent Mode: PRVC FiO2 (%):  [40 %] 40 % Set Rate:  [28 bmp] 28 bmp Vt Set:  [560 mL] 560 mL PEEP:  [5 cmH20] 5 cmH20 Plateau Pressure:  [21 cmH20-23 cmH20] 23 cmH20   Intake/Output Summary (Last 24 hours) at 07/22/2021 07/24/2021 Last data  filed at 07/22/2021 0800 Gross per 24 hour  Intake 2759.6 ml  Output 1575 ml  Net 1184.6 ml   Filed Weights   07/22/2021 1126 07/12/2021 1550 07/21/21 0500  Weight: 104.3 kg 98.9 kg 99 kg    Examination: General appearance: 80 y.o., male, intubated Eyes: pupils again equal very sluggishly reactive if at all HENT: NCAT; MMM Neck: Trachea midline; no lymphadenopathy, no JVD Lungs: rhonchorous bl, equal chest rise CV: RRR, no murmur  Abdomen: Soft, non-tender; non-distended, hypoactive BS present  Extremities: 2+ peripheral edema, warm Skin: Normal turgor and texture; no rash Neuro: does not withdraw or posture to noxious stimuli, does not breathe over vent, +gag  S Cr trending down, lytes ok CBC stable   Resolved Hospital Problem list     Assessment & Plan:   # Left M1 occlusion s/p thrombectomy: - SBP 120-140 - ASA 81 daily - PT/OT/speech eventually - stroke team following  # Acute hypoxic respiratory failure: # History of OSA # Massive PE # DVT PE and LLL occlusion/secretions. - position left lung up - increase CPT  - full vent support with lung protective settings, can ideally keep peep < 10 in setting acute neurologic injury - sedation off - s/p thrombectomy, systemic TNK and now on heparin gtt  # Acute blood loss anemia # suspected GIB Has known AVM which may be culprit. - trend cbc - continue heparin gtt - protonix 40 BID IV  # PEA arrest # Anoxic brain injury Due to PE,  s/p systemic TNK. MRI 12/25 revealing extensive ischemic injury. - s/p normothermia protocol - watch for seizure activity - prognosis is poor, family meeting today with neuro  # PMR - prednisone 5 mg daily  # Non-oliguric AKI vs CKD: - limit nephrotoxic medications - CTM   Best Practice (right click and "Reselect all SmartList Selections" daily)   Diet/type: tubefeeds DVT prophylaxis: systemic heparin GI prophylaxis: PPI Lines: Central line Foley:  Yes, and it is still  needed Code Status:  full code Last date of multidisciplinary goals of care discussion [Palliative care involved, appreciate assistance. Made DNR after discussion post arrest 12/22. Family remains hopeful for recovery however. Family meeting today with neuro ]  Critical care time: 30 minutes

## 2021-07-22 NOTE — Progress Notes (Signed)
ANTICOAGULATION CONSULT NOTE  Pharmacy Consult for Heparin Indication: Acute bilateral PEs and history of DVT  No Known Allergies  Patient Measurements: Height: 5\' 9"  (175.3 cm) Weight: 99 kg (218 lb 4.1 oz) IBW/kg (Calculated) : 70.7  Heparin Dosing Weight: 91 kg  Vital Signs: Temp: 99.3 F (37.4 C) (12/26 0400) Temp Source: Axillary (12/26 0400) BP: 122/59 (12/26 0700) Pulse Rate: 68 (12/26 0700)  Labs: Recent Labs    07/20/21 0405 07/20/21 0512 07/21/21 0611 07/21/21 0903 07/21/21 1748 07/21/21 1800 07/22/21 0148 07/22/21 0203 07/22/21 0526  HGB  --    < > 7.6*  --  7.7*  --  7.4*  --   --   HCT  --    < > 24.6*  --  25.0*  --  24.3*  --   --   PLT  --    < > 138*  --  160  --  164  --   --   HEPARINUNFRC 0.30   < >  --  0.21*  --  0.39  --  0.42  --   CREATININE 1.55*  --  1.28*  --   --   --   --   --  1.16   < > = values in this interval not displayed.     Estimated Creatinine Clearance: 60.9 mL/min (by C-G formula based on SCr of 1.16 mg/dL).  Assessment: 44 YOM presented with right-sided weakness and aphagia, underwent revascularization of L MCA.  Also found to have acute bilateral PEs in setting of recent DVT, so Pharmacy consulted to dose IV heparin. He was on Eliquis PTA, but taken off on 12/18 for EGD.   Patient taken to IR 12/21 pm for PE thrombectomy and unable to aspirate significant clot. Patient with loss of pulses and coded in IR. Pt with multiple relative contraindications to lytics but decision made to give tenecteplase 12/22 ~0100.  Heparin resumed 12/22.   Heparin level is now therapeutic on 1600 units/hr. Hgb significantly dropped 12/23 from 10.8 > 7.9 now 7.6, plt 138. CT chest/abd with no evidence of retroperitoneal hematoma or other sources of blood loss but per RN, patient had a dark bowel movement today. CCM was made aware and they wanted to keep heparin running for now. MRI head showed no s/s of bleed.   Heparin level came back  therapeutic again. Hgb remains low at 7.4. MRI showed extensive infarcts. Pt had some black tarry BM last night. Team is aware. We will continue with low goal heparin.    Goal of Therapy:  Heparin level 0.3-0.5 units/ml Monitor platelets by anticoagulation protocol: Yes  Plan:  Continue heparin infusion at 1600 units/hr Continue to monitor H&H and platelets    1/24, PharmD, BCIDP, AAHIVP, CPP Infectious Disease Pharmacist 07/22/2021 7:46 AM

## 2021-07-22 NOTE — Progress Notes (Signed)
Brief Palliative Medicine Progress Note:  PMT is shadowing during hypothermia protocol - noted was completed and Neurology met with family today. There is a tentative plan for another family meeting with Neurology tomorrow. Spoke with Morocco Shafer/Neurology NP - PMT will assist with [place] during follow up family meeting tomorrow.  Thank you for allowing PMT to assist in the care of this patient.  Karson Reede M. Tamala Julian [facility] Palliative Medicine Team Team Phone: [phone] NO CHARGE

## 2021-07-22 NOTE — Progress Notes (Signed)
SLP Cancellation Note  Patient Details Name: Jaime Whitaker MRN: [id number] DOB: 1941/06/17   Cancelled treatment:       Reason Eval/Treat Not Completed: Patient not medically ready. Will sign off at this time, please reorder as needed   Jahdiel Krol, Riley Nearing 07/22/2021, 9:15 AM

## 2021-07-23 DIAGNOSIS — Z01818 Encounter for other preprocedural examination: Secondary | ICD-10-CM | POA: Diagnosis not present

## 2021-07-23 DIAGNOSIS — Z789 Other specified health status: Secondary | ICD-10-CM

## 2021-07-23 DIAGNOSIS — I6602 Occlusion and stenosis of left middle cerebral artery: Secondary | ICD-10-CM

## 2021-07-23 DIAGNOSIS — I63512 Cerebral infarction due to unspecified occlusion or stenosis of left middle cerebral artery: Secondary | ICD-10-CM | POA: Diagnosis not present

## 2021-07-23 DIAGNOSIS — Z66 Do not resuscitate: Secondary | ICD-10-CM

## 2021-07-23 DIAGNOSIS — J9602 Acute respiratory failure with hypercapnia: Secondary | ICD-10-CM | POA: Diagnosis not present

## 2021-07-23 DIAGNOSIS — J9601 Acute respiratory failure with hypoxia: Secondary | ICD-10-CM | POA: Diagnosis not present

## 2021-07-23 DIAGNOSIS — I63412 Cerebral infarction due to embolism of left middle cerebral artery: Secondary | ICD-10-CM | POA: Diagnosis not present

## 2021-07-23 LAB — CBC
HCT: 26.4 % — ABNORMAL LOW (ref 39.0–52.0)
HCT: 26.4 % — ABNORMAL LOW (ref 39.0–52.0)
HCT: 27.5 % — ABNORMAL LOW (ref 39.0–52.0)
Hemoglobin: 8.2 g/dL — ABNORMAL LOW (ref 13.0–17.0)
Hemoglobin: 8.1 g/dL — ABNORMAL LOW (ref 13.0–17.0)
Hemoglobin: 8.3 g/dL — ABNORMAL LOW (ref 13.0–17.0)
MCH: 30 pg (ref 26.0–34.0)
MCH: 29.8 pg (ref 26.0–34.0)
MCH: 29.2 pg (ref 26.0–34.0)
MCHC: 31.1 g/dL (ref 30.0–36.0)
MCHC: 30.7 g/dL (ref 30.0–36.0)
MCHC: 30.2 g/dL (ref 30.0–36.0)
MCV: 96.7 fL (ref 80.0–100.0)
MCV: 97.1 fL (ref 80.0–100.0)
MCV: 96.8 fL (ref 80.0–100.0)
Platelets: 234 10*3/uL (ref 150–400)
Platelets: 240 10*3/uL (ref 150–400)
Platelets: 273 10*3/uL (ref 150–400)
RBC: 2.73 MIL/uL — ABNORMAL LOW (ref 4.22–5.81)
RBC: 2.72 MIL/uL — ABNORMAL LOW (ref 4.22–5.81)
RBC: 2.84 MIL/uL — ABNORMAL LOW (ref 4.22–5.81)
RDW: 19.4 % — ABNORMAL HIGH (ref 11.5–15.5)
RDW: 19.4 % — ABNORMAL HIGH (ref 11.5–15.5)
RDW: 18.9 % — ABNORMAL HIGH (ref 11.5–15.5)
WBC: 8.7 10*3/uL (ref 4.0–10.5)
WBC: 7.9 10*3/uL (ref 4.0–10.5)
WBC: 8.2 10*3/uL (ref 4.0–10.5)
nRBC: 0.9 % — ABNORMAL HIGH (ref 0.0–0.2)
nRBC: 0.4 % — ABNORMAL HIGH (ref 0.0–0.2)
nRBC: 0.4 % — ABNORMAL HIGH (ref 0.0–0.2)

## 2021-07-23 LAB — BASIC METABOLIC PANEL
Anion gap: 6 (ref 5–15)
BUN: 18 mg/dL (ref 8–23)
CO2: 25 mmol/L (ref 22–32)
Calcium: 8.6 mg/dL — ABNORMAL LOW (ref 8.9–10.3)
Chloride: 109 mmol/L (ref 98–111)
Creatinine, Ser: 1.18 mg/dL (ref 0.61–1.24)
GFR, Estimated: >60 mL/min (ref >60)
Glucose, Bld: 144 mg/dL — ABNORMAL HIGH (ref 70–99)
Potassium: 4.3 mmol/L (ref 3.5–5.1)
Sodium: 140 mmol/L (ref 135–145)

## 2021-07-23 LAB — HEPARIN LEVEL (UNFRACTIONATED): Heparin Unfractionated: 0.39 IU/mL (ref 0.30–0.70)

## 2021-07-23 LAB — GLUCOSE, CAPILLARY
Glucose-Capillary: 227 mg/dL — ABNORMAL HIGH (ref 70–99)
Glucose-Capillary: 170 mg/dL — ABNORMAL HIGH (ref 70–99)
Glucose-Capillary: 183 mg/dL — ABNORMAL HIGH (ref 70–99)
Glucose-Capillary: 146 mg/dL — ABNORMAL HIGH (ref 70–99)
Glucose-Capillary: 161 mg/dL — ABNORMAL HIGH (ref 70–99)
Glucose-Capillary: 219 mg/dL — ABNORMAL HIGH (ref 70–99)
Glucose-Capillary: 195 mg/dL — ABNORMAL HIGH (ref 70–99)

## 2021-07-23 MED ORDER — FUROSEMIDE 10 MG/ML IJ SOLN
60.0000 mg | Freq: Once | INTRAMUSCULAR | Status: AC
  Administered 2021-07-23: 18:00:00 60 mg via INTRAVENOUS
  Filled 2021-07-23: qty 6

## 2021-07-23 NOTE — Progress Notes (Signed)
Occupational Therapy Discharge Patient Details Name: Jaime Whitaker MRN: [id number] DOB: 1940/08/14 Today's Date: 07/23/2021 Time:  -     Patient discharged from OT services secondary to medical decline - will need to re-order OT to resume therapy services. OT following with more than 3 cancels so medically not ready at this time.   Please see latest therapy progress note for current level of functioning and progress toward goals.    Progress and discharge plan discussed with patient and/or caregiver: Patient unable to participate in discharge planning and no caregivers available  GO     Wynona Neat, OTR/L  Acute Rehabilitation Services Pager: [phone] Office: [phone] .  07/23/2021, 7:46 AM

## 2021-07-23 NOTE — Progress Notes (Signed)
ANTICOAGULATION CONSULT NOTE  Pharmacy Consult for Heparin Indication: Acute bilateral PEs and history of DVT  No Known Allergies  Patient Measurements: Height: 5\' 9"  (175.3 cm) Weight: 106 kg (233 lb 11 oz) IBW/kg (Calculated) : 70.7  Heparin Dosing Weight: 91 kg  Vital Signs: Temp: 99.8 F (37.7 C) (12/27 0800) Temp Source: Axillary (12/27 0800) BP: 133/71 (12/27 1000) Pulse Rate: 87 (12/27 1000)  Labs: Recent Labs    07/21/21 0611 07/21/21 0903 07/21/21 1800 07/22/21 0148 07/22/21 0203 07/22/21 0526 07/22/21 0855 07/22/21 1759 07/23/21 0623 07/23/21 0913  HGB 7.6*   < >  --    < >  --   --    < > 8.2* 8.2* 8.1*  HCT 24.6*   < >  --    < >  --   --    < > 27.6* 26.4* 26.4*  PLT 138*   < >  --    < >  --   --    < > 211 234 240  HEPARINUNFRC  --    < > 0.39  --  0.42  --   --   --  0.39  --   CREATININE 1.28*  --   --   --   --  1.16  --   --  1.18  --    < > = values in this interval not displayed.     Estimated Creatinine Clearance: 61.9 mL/min (by C-G formula based on SCr of 1.18 mg/dL).  Assessment: 34 YOM presented with right-sided weakness and aphagia, underwent revascularization of L MCA.  Also found to have acute bilateral PEs in setting of recent DVT, so Pharmacy consulted to dose IV heparin. He was on Eliquis PTA, but taken off on 12/18 for EGD.   Patient taken to IR 12/21 pm for PE thrombectomy and unable to aspirate significant clot. Patient with loss of pulses and coded in IR. Pt with multiple relative contraindications to lytics but decision made to give tenecteplase 12/22 ~0100.  Heparin resumed 12/22.   Heparin level therapeutic and stable on 1600 units/hr. Hgb significantly dropped 12/23 and CT without evidence of retroperitoneal hematoma.  Patient has been having dark bowel movements and CCM wanted to keep heparin running for now. MRI head showed no s/s of bleed.  CBC stable.  Goal of Therapy:  Heparin level 0.3-0.5 units/ml Monitor platelets  by anticoagulation protocol: Yes  Plan:  Continue heparin infusion at 1600 units/hr Daily heparin level and CBC  Tia Hieronymus D. 1/24, PharmD, BCPS, BCCCP 07/23/2021, 11:32 AM

## 2021-07-23 NOTE — Progress Notes (Signed)
Daily Progress Note   Patient Name: Jaime Whitaker       Date: 07/23/2021 DOB: 12/09/1940  Age: 80 y.o. MRN#: [id number] Attending Physician: Stroke, Md, MD Primary Care Physician: Caryl Bis, MD Admit Date: 07/14/2021  Reason for Consultation/Follow-up: Establishing goals of care  Subjective: Chart review performed. Received report from primary RN - no acute concerns. Reports not much change in status from yesterday, but seems less responsive; opens eyes at times.  Went to visit patient at bedside - no family/visitors present. Patient was lying in bed intubated and not sedated. He does not respond to voice/gentle touch. No signs or non-verbal gestures of pain or discomfort noted. No respiratory distress or secretions noted; slight increased work of breathing.  1:30 PM Met family, Jaime Ores, NP, and Dr. Erlinda Hong for scheduled family meeting.  Met with Jaime Whitaker, Jaime Whitaker, Jaime Whitaker, Jaime Whitaker, Jaime Whitaker, Jaime Whitaker in [place] conference room to discuss diagnosis, prognosis, GOC, EOL wishes, disposition, and options.  I introduced Palliative Medicine as specialized medical care for people living with serious illness. It focuses on providing relief from the symptoms and stress of a serious illness. The goal is to improve quality of life for both the patient and the family.  Dr. Erlinda Hong and Jaime Ores, NP / Neurology reviewed medical updates and hospital course since admission. Answered all family's medical questions.  After speaking with Neurology: We discussed patient's current illness and what it means in the larger context of patient's on-going co-morbidities. Family seem to have a clear understanding of patient's acute medical situation. Natural trajectory and expectations at EOL were  discussed. I attempted to elicit values and goals of care important to the patient. The difference between aggressive medical intervention (pursuing trach/PEG/LTACH with poor functional recovery) and comfort care was considered in light of the patient's goals of care.   Education provided on tracheostomy procedure and expectations for management per their request.  Reviewed potential complications with pursuing trach/PEG/life prolonging interventions to include but not limited to: skin breakdown/pressure injury, infection, aspiration, trach dislodgment, risk for recurrent blood clots, risk for another cardiac arrest, high risk for rehospitalization.   We talked about transition to comfort measures in house and what that would entail inclusive of medications to control pain, dyspnea, agitation, nausea, and itching. We discussed stopping all unnecessary measures such as blood  draws, needle sticks, oxygen, antibiotics, CBGs/insulin, cardiac monitoring, IVF, and frequent vital signs.  Provided education and counseling at length on the philosophy and benefits of hospice care. Discussed that it offers a holistic approach to care in the setting of life threatening illness, and is about supporting the patient where they are allowing nature to take it's course. Discussed the hospice team includes RNs, physicians, social workers, and chaplains. They can provide personal care, support for the family, and help keep patient out of the hospital. Provided reassurance that residential hospice referral could be cancelled (would anticipate hospital death) at any time if patient's condition changed and it was felt they were too unstable for transfer vs keeping patient in house for EOL care.  Family are all in agreement that patient would not want to pursue trach/PEG. Family requested I step out of the room to provide them time to discuss.   Allowed space and time for family to discuss thoughts privately per their  request.  When I re-entered the room, answered all questions. Family are understandably tearful. All family are in agreement on next steps except one son who would like time to process information given to him today. Scheduled another family meeting for tomorrow at 5p.   Advance directives, artificial feeding and hydration, and rehospitalization were considered and discussed. Patient does not have a Living Will to the family's knowledge.  Visit also consisted of discussions dealing with the complex and emotionally intense issues of symptom management and palliative care in the setting of serious and potentially life-threatening illness. Palliative care team will continue to support patient, patient's family, and medical team.  Discussed with family the importance of continued conversation with each other and the medical providers regarding overall plan of care and treatment options, ensuring decisions are within the context of the patients values and GOCs.    Questions and concerns were addressed. The patient/family was encouraged to call with questions and/or concerns. PMT card was provided.  Length of Stay: 6  Current Medications: Scheduled Meds:   acetaminophen  650 mg Oral Q4H   Or   acetaminophen (TYLENOL) oral liquid 160 mg/5 mL  650 mg Per Tube Q4H   Or   acetaminophen  650 mg Rectal Q4H   aspirin  81 mg Per Tube Daily   brimonidine  1 drop Both Eyes Daily   busPIRone  30 mg Oral Q8H   Or   busPIRone  30 mg Per Tube Q8H   chlorhexidine gluconate (MEDLINE KIT)  15 mL Mouth Rinse BID   Chlorhexidine Gluconate Cloth  6 each Topical Daily   docusate  100 mg Per Tube BID   feeding supplement (PROSource TF)  45 mL Per Tube TID   insulin aspart  0-15 Units Subcutaneous Q4H   insulin aspart  4 Units Subcutaneous Q4H   latanoprost  1 drop Both Eyes QHS   mouth rinse  15 mL Mouth Rinse 10 times per day   pantoprazole (PROTONIX) IV  40 mg Intravenous Q12H   polyethylene glycol  17 g  Per Tube Daily   pravastatin  20 mg Per Tube q1800   predniSONE  5 mg Per Tube Daily    Continuous Infusions:  sodium chloride 50 mL/hr at 07/23/21 1400   sodium chloride     feeding supplement (VITAL 1.5 CAL) 1,000 mL (07/22/21 0203)   heparin 1,600 Units/hr (07/23/21 1400)   propofol (DIPRIVAN) infusion 10 mcg/kg/min (07/21/21 1823)    PRN Meds: acetaminophen **OR** acetaminophen (TYLENOL) oral  liquid 160 mg/5 mL **OR** acetaminophen, fentaNYL (SUBLIMAZE) injection, ondansetron (ZOFRAN) IV  Physical Exam Vitals and nursing note reviewed.  Constitutional:      General: He is not in acute distress.    Interventions: He is intubated.  Pulmonary:     Effort: No respiratory distress. He is intubated.  Skin:    General: Skin is warm and dry.  Neurological:     Mental Status: He is unresponsive.     Motor: Weakness present.  Psychiatric:        Speech: He is noncommunicative.            Vital Signs: BP (!) 149/71    Pulse 84    Temp 99.4 F (37.4 C) (Axillary)    Resp (!) 24    Ht 5' 9"  (1.753 m)    Wt 106 kg    SpO2 100%    BMI 34.51 kg/m  SpO2: SpO2: 100 % O2 Device: O2 Device: Ventilator O2 Flow Rate:    Intake/output summary:  Intake/Output Summary (Last 24 hours) at 07/23/2021 1524 Last data filed at 07/23/2021 1400 Gross per 24 hour  Intake 2765.56 ml  Output 2975 ml  Net -209.44 ml   LBM: Last BM Date: 07/23/21 Baseline Weight: Weight: 104.3 kg Most recent weight: Weight: 106 kg       Palliative Assessment/Data: PPS 30% with tube feeds      Patient Active Problem List   Diagnosis Date Noted   Pulmonary embolus ([place])    Obstructive cardiovascular shock ([place])    Stroke (cerebrum) ([place]) 07/19/2021   Middle cerebral artery embolism, left 07/18/2021   Acute respiratory failure with hypoxia and hypercapnia ([facility])    H. pylori infection 56/31/4970   Helicobacter pylori gastritis 06/17/2021   Positive occult stool blood test 05/28/2021   Melena 05/28/2021    Iron deficiency anemia 05/28/2021   Fatigue 05/28/2021    Palliative Care Assessment & Plan   Patient Profile: 80 y.o. male  with past medical history of hypertension and hyperlipidemia who presented to the emergency department on 07/13/2021 with right-sided weakness and right facial droop. Code stroke was called, but he was out of the window for TPA. He had been on Eliquis for a DVT, but was taken off it 12/18 for a GI procedure.  CTA head showed occlusion of the left middle cerebral artery at the mid M1 segment. He underwent complete revascularization of the occluded left MCA and was left intubated as he was unable to maintain adequate O2 saturation after anesthesia reversal.  CTA chest showed very large acute bilateral pulmonary emboli , with near complete occlusion of the right pulmonary artery and left pulmonary artery saddle emboli extending substantially in the left upper and lower lobe. Also showed cardiomegaly indicating right heart strain and ascending thoracic aortic aneurysm.    12/22 - he underwent pulmonary angiogram and bilateral pulmonary artery aspiration thrombectomy. During the procedure, patient became bradycardic with loss of pulse - he was resuscitated with ROSC. Systemic tPA was administered due to inability to aspirate significant clot burden.  12/23 - EEG negative for seizures, but shows moderate to severe degree of encephalopathy.   Assessment: Left M1 occlusion s/p thrombectomy PEA arrest Anoxic brain injury Acute blood loss anemia Acute hypoxic respiratory failure Bilateral PE/DVT Non-oliguric AKI vs CKD  Recommendations/Plan: Continue current medical treatment Continue DNR/DNI as previously documented Family seem to be leaning toward transition to full comfort care - they are clear patient would not want to pursue trach/PEG  All family are in agreement on next steps but one son who would like additional time to process information given to him today Follow  up meeting with PMT is scheduled for tomorrow 11/28 at 5pm PMT will continue to follow and support holistically  Goals of Care and Additional Recommendations: Limitations on Scope of Treatment: Full Scope Treatment  Code Status:    Code Status Orders  (From admission, onward)           Start     Ordered   07/18/21 0210  Do not attempt resuscitation (DNR)  Continuous       Question Answer Comment  In the event of cardiac or respiratory ARREST Do not call a code blue   In the event of cardiac or respiratory ARREST Do not perform Intubation, CPR, defibrillation or ACLS   In the event of cardiac or respiratory ARREST Use medication by any route, position, wound care, and other measures to relive pain and suffering. May use oxygen, suction and manual treatment of airway obstruction as needed for comfort.      07/18/21 0209           Code Status History     Date Active Date Inactive Code Status Order ID Comments User Context   06/28/2021 1553 07/18/2021 0209 Full Code [id number]  Luanne Bras, MD Inpatient   07/25/2021 1439 07/11/2021 1553 Full Code [id number]  Greta Doom, MD HOV   07/19/2021 1354 07/01/2021 1439 Full Code [id number]  Greta Doom, MD Inpatient       Prognosis:  Unable to determine   Discharge Planning: To Be Determined  Care plan was discussed with primary RN, Dr. Erlinda Hong, Jaime Ores, NP, patient's family  Thank you for allowing the Palliative Medicine Team to assist in the care of this patient.   Total Time 120 minutes Prolonged Time Billed  yes       Greater than 50%  of this time was spent counseling and coordinating care related to the above assessment and plan.  Lin Landsman, NP  Please contact Palliative Medicine Team phone at [phone] for questions and concerns.

## 2021-07-23 NOTE — Progress Notes (Addendum)
STROKE TEAM PROGRESS NOTE   INTERVAL HISTORY Less responsive to verbal and noxious stimuli today. Dolls eyes present. MRI shows extensive involvement of the left cerebral hemisphere in the left MCA territory, left PCA territory and left watershed territories. Involvement of multiple vascular territories raises the possibility of an hypercoagulable or embolic process. Superimposed acute hypoxic/ischemic injury cannot be excluded.  Neurological exam unchanged. Plan for family meeting at 1330 today to discuss goals of care and to provide information on tracheostomy, PEG tube, and potential course.   Vitals:   07/23/21 0735 07/23/21 0800 07/23/21 0900 07/23/21 1000  BP: 136/64 (!) 145/69 (!) 147/79 133/71  Pulse: 78 85 90 87  Resp: (!) 22 20 (!) 23 20  Temp:  99.8 F (37.7 C)    TempSrc:  Axillary    SpO2: 100% 100% 100% 100%  Weight:      Height:       CBC:  Recent Labs  Lab 08/04/2021 1122 08/20/2021 2014 07/18/21 0523 07/19/21 0455 07/23/21 0623 07/23/21 0913  WBC 9.5  --  16.4*   < > 8.7 7.9  NEUTROABS 5.4  --  14.0*  --   --   --   HGB 14.9   < > 12.6*   < > 8.2* 8.1*  HCT 46.3   < > 40.4   < > 26.4* 26.4*  MCV 94.3  --  93.7   < > 96.7 97.1  PLT 125*  --  131*   < > 234 240   < > = values in this interval not displayed.    Basic Metabolic Panel:  Recent Labs  Lab 07/20/21 0405 07/21/21 0611 07/22/21 0526 07/23/21 0623  NA 140 141 140 140  K 3.4* 3.9 4.1 4.3  CL 110 111 112* 109  CO2 21* 23 21* 25  GLUCOSE 206* 183* 218* 144*  BUN 32* 23 22 18   CREATININE 1.55* 1.28* 1.16 1.18  CALCIUM 7.7* 8.1* 8.0* 8.6*  MG 2.0 2.1  --   --   PHOS 1.9* 2.6  --   --     Lipid Panel:  Recent Labs  Lab 07/18/21 0523 07/19/21 0455 07/22/21 0526  CHOL 131  --   --   TRIG 98   < > 125  HDL 40*  --   --   CHOLHDL 3.3  --   --   VLDL 20  --   --   LDLCALC 71  --   --    < > = values in this interval not displayed.    HgbA1c:  Recent Labs  Lab 07/18/21 0523  HGBA1C 7.4*     Urine Drug Screen: No results for input(s): LABOPIA, COCAINSCRNUR, LABBENZ, AMPHETMU, THCU, LABBARB in the last 168 hours.  Alcohol Level  Recent Labs  Lab 02-Aug-2021 1126  ETH <10     IMAGING past 24 hours No results found.  PHYSICAL EXAM General - Well nourished, well developed, intubated off sedation. Ophthalmologic - fundi not visualized due to noncooperation. Cardiovascular - Regular rate and rhythm.   Neuro - intubated off sedation, eyes closed, minimally open to voice, less than yesterday not following commands.  With forced eye opening, eyes in mid position, inconsistently blinking to visual threat on the left but not on the right, doll's eyes present, not tracking, b/l pupil 1.59mm, sluggish to light. Corneal reflex weakly present, left more reactive than right, gag and cough present.  Breathing over the vent.   Facial symmetry not able to  test due to ET tube.   Tongue protrusion not cooperative.  On pain stimulation, slight movement on the right UE and LE but not consistent on repeated testing.  Right positive babinski. Sensation, coordination and gait not tested.   ASSESSMENT/PLAN [name] is a 80 y.o. male with history of HTN, anemia, sleep apnea, stroke in 1998, HLD, eliquis use for DVT in RLE up until 12/18 presenting with right sided weakness and aphasia. Patient presented with aphasia and right hemiplegia and yesterday underwent emergent mechanical thrombectomy for left M1 occlusion with excellent  TICI 3 revascularization but unfortunately last night developed massive pulmonary embolism with low blood pressure developed bradycardic PEA arrest during pulmonary thrombectomy procedure requiring 30 minutes of CPR before  ROSC.  He has remained unresponsive and on full ventilatory support and vasopressor support. Developed massive PE and went to IR overnight 12/21 for a pulmonary arterial thrombectomy with a subsequent PEA arrest in IR.  He was treated with IV  TNKase for systemic thrombolysis.  CCM initiated arctic sun for possible hypoxic brian injury and an overnight EEG. MRI after completion of hypothermia shows extensive involvement of the left cerebral hemisphere in the left MCA territory, left PCA territory and left watershed territories. Involvement of multiple vascular territories raises the possibility of an embolic process. Superimposed acute hypoxic/ischemic injury cannot be excluded.  Etiology for patient stroke involving pattern, source unclear but likely related to hypercoagulable state given history of DVT and recurrent PE.  However, with extensive stroke and anoxic brain injury on MRI, patient poor neuro exam without sedation, likely poor prognosis. Patient remains on IV heparin infusion and ASA 81. No sedation on board currently.  Stroke: embolic shower but largest at left MCA with L M1 occlusion s/p IR with Left M1 TICI3 but complicated by bilateral PE and PEA arrest s/p  IV TNK and unsuccessful thrombectomy Code Stroke- Hyperdense L MCA, chronic small-vessel ischemic changes of white matter. Old thalamic infarction  CTA head & neck-Occlusion of L MCA at mid M1. Mild atherosclerotic plaque.  CT perfusion- Left MCA vascular territory totally , no core infarct  MRI  Extensive acute/subacute infarcts within the supratentorial and infratentorial brain, greatest involvement of L MCA, L PCA, and L watershed territory. Embolic with hypoxic/ischemic injury 2D Echo EF 60-65%, mild dilation of ascending aorta, interatrial septum not well visualized Right groin ultrasound done and shows hematoma. LDL 71 HgbA1c 7.4 EEG no seizure activity VTE prophylaxis - SCDs Eliquis (apixaban) daily but off for colonoscopy prior to admission, now on heparin IV.  Therapy recommendations:  pending Disposition:  pending, poor prognosis, family meeting today  B/l PE s/p  IV TNK and unsuccessful thrombectomy PEA arrest during bilateral pulmonary artery  thrombectomy Hypotension with bradycardia CPR 2m before ROSC Initiation of Arctic Sun by CCM MRI showed evidence of anoxic brain injury Poor prognosis family meeting for GOC discussion  Respiratory failure Intubated for cardiac arrest resuscitation On vent Not candidate for extubation Vent management per CCM  Hypotension Likely due to PEA arrest Was on levophed, now no longer requiring vasopressors Home BP medication- metoprolol 50mg   Hyperlipidemia Home meds:  Lovastatin 20mg , Pravastatin 20 mg ordered in hospital LDL 71, goal < 70 Continue statin at discharge  Diabetes type II uncontrolled HgbA1c 7.4, goal < 7.0 SSI CBG monitoring  Tobacco abuse Current smoker Smoking cessation counseling will be provided  Other Stroke Risk Factors Advanced Age >/= 77  Obesity, Body mass index is 34.51 kg/m., BMI >/= 30 associated  with increased stroke risk, recommend weight loss, diet and exercise as appropriate  Hx of stroke in 1998 - no details Hx of DVT on eliquis Obstructive sleep apnea, on CPAP at home  Other Active Problems Acute blood loss anemia - Hb 7.6-> 8.2.  -> 8.1 Pan CT showed no retroperitoneal hematoma.  Continue monitoring. AKI creatinine 1.55-> 1.16-> 1.18 Leukocytosis WBC 11.4-> 7.7-> 6.8-> 8.7 -> 7.9   Hospital day # 6  Patient seen and examined by NP/APP with MD. MD to update note as needed.   Elmer Picker, DNP, FNP-BC [facility] Pager: [phone]  ATTENDING NOTE: I reviewed above note and agree with the assessment and plan. Pt was seen and examined.   Sister at bedside.  Patient still intubated without sedation, no significant neurological changes.  Still on heparin IV and aspirin.  Patient seems slightly less responsive than yesterday as he did not open eyes on voice but he did so yesterday.  Vitals and labs stable.  Had family meeting 1:30 PM with palliative care services, answered all medical question from patient family.   Updated from palliative care services that patient family leaning towards to comfort care measures but needed more time for unanimous decision.  Plan for another family meeting 5 PM tomorrow.  Continue current management.  For detailed assessment and plan, please refer to above as I have made changes wherever appropriate.   Marvel Plan, MD PhD Stroke Neurology 07/23/2021 6:22 PM  This patient is critically ill due to embolic stroke, respite failure, cardiac arrest, bilateral PE, bradycardia and at significant risk of neurological worsening, death form recurrent stroke, cardio arrest, heart failure, pulmonary hypertension, seizure, renal failure. This patient's care requires constant monitoring of vital signs, hemodynamics, respiratory and cardiac monitoring, review of multiple databases, neurological assessment, discussion with family, other specialists and medical decision making of high complexity. I spent 45 minutes of neurocritical care time in the care of this patient. I had long discussion with daughter at bedside and all family during family meeting, updated pt current condition, treatment plan and potential prognosis, and answered all the questions. They all expressed understanding and appreciation.      To contact Stroke Continuity provider, please refer to [email or web address]. After hours, contact General Neurology

## 2021-07-23 NOTE — Progress Notes (Addendum)
NAME:  Jaime Whitaker, MRN:  [id number], DOB:  Sep 23, 1940, LOS: 6 ADMISSION DATE:  2021-07-23, CONSULTATION DATE:  07/23/2021 REFERRING MD:  Corliss Skains, CHIEF COMPLAINT:  weakness   History of Present Illness:  80yM on eliquis for ?recently discovered DVT, IDA, PMR, OSA, CVA in 1994. History is obtained through chart review and discussion with the patient's wife. He was planning to undergo EGD for intervention on AVM felt to be potential culprit for IDA today and had held eliquis since 12/18. Today had R sided weakness, aphasia and gaze deviation.  He was also noted to be hypoxic in ED requiring NRB. Found to have L M1 occlusion now s/p aspiration with TICI 3 revascularization. Remained hypoxic after reversal of NMB, left intubated and arrived to neuro ICU on 100 of neo, 50 of propofol, saline infusion at 75 mL/hr  Pertinent  Medical History  DVT IDA PMR OSA CVA  Significant Hospital Events: Including procedures, antibiotic start and stop dates in addition to other pertinent events   2021/07/23 intubated, mechanical thrombectomy L M1 with TICI 3 revascularization 12/22 mechanical thrombectomy c/b 30 min in and out of PEA arrest, systemic TNK for PE. Made DNR after discussion with family. Normothermia protocol initiated 12/24 left femoral central line placed 12/25 GI bleeding but HDS and has known AVM continuing heparin, MR brain with extensive ischemic burden  Interim History / Subjective:   No acute events overnight. Family meeting planned for today with neurology  Objective   Blood pressure 133/71, pulse 87, temperature 99.8 F (37.7 C), temperature source Axillary, resp. rate 20, height 5\' 9"  (1.753 m), weight 106 kg, SpO2 100 %.    Vent Mode: PSV;CPAP FiO2 (%):  [40 %] 40 % Set Rate:  [16 bmp] 16 bmp Vt Set:  [560 mL] 560 mL PEEP:  [5 cmH20] 5 cmH20 Pressure Support:  [5 cmH20] 5 cmH20 Plateau Pressure:  [17 cmH20-20 cmH20] 17 cmH20   Intake/Output Summary (Last 24 hours) at  07/23/2021 1114 Last data filed at 07/23/2021 1000 Gross per 24 hour  Intake 2639.04 ml  Output 5325 ml  Net -2685.96 ml   Filed Weights   July 23, 2021 1550 07/21/21 0500 07/23/21 0411  Weight: 98.9 kg 99 kg 106 kg    Examination: Gen:      No acute distress HEENT:  EOMI, sclera anicteric Neck:     No masses; no thyromegaly Lungs:    Clear to auscultation bilaterally; normal respiratory effort CV:         Regular rate and rhythm; no murmurs Abd:      + bowel sounds; soft, non-tender; no palpable masses, no distension Ext:    No edema; adequate peripheral perfusion Skin:      Warm and dry; no rash Neuro: Unresponsive  Labs/ Imaging reviewed Labs are stable, no new imaging  Resolved Hospital Problem list     Assessment & Plan:  Left M1 occlusion s/p thrombectomy, systemic TNKase PEA arrest with anoxic brain injury S/p normothermia protocol Continue supportive care Continue heparin drip  Acute blood loss anemia, possible GI bleed Has known AVM, hemoglobin has remained stable Monitor CBC while he is on anticoagulation Continue Protonix  Acute hypoxic respiratory failure, bilateral PE, DVT History of OSA Continue vent support, heparin drip  PMR Continue daily prednisone  Non-oliguric AKI vs CKD Follow urine output and creatinine  Best Practice (right click and "Reselect all SmartList Selections" daily)   Diet/type: tubefeeds DVT prophylaxis: systemic heparin GI prophylaxis: PPI Lines: Central line Foley:  Yes, and it is still needed Code Status:  full code Last date of multidisciplinary goals of care discussion [Palliative care involved, appreciate assistance. Made DNR after discussion post arrest 12/22. Family remains hopeful for recovery however. Family meeting today 12/27 with neuro ]  Critical care time:    The patient is critically ill with multiple organ system failure and requires high complexity decision making for assessment and support, frequent  evaluation and titration of therapies, advanced monitoring, review of radiographic studies and interpretation of complex data.   Critical Care Time devoted to patient care services, exclusive of separately billable procedures, described in this note is 35 minutes.   Chilton Greathouse MD [facility] See Amion for pager  If no response to pager , please call [phone] until 7pm After 7:00 pm call Elink  [phone] 07/23/2021, 11:23 AM

## 2021-07-24 DIAGNOSIS — J9601 Acute respiratory failure with hypoxia: Secondary | ICD-10-CM | POA: Diagnosis not present

## 2021-07-24 DIAGNOSIS — J9602 Acute respiratory failure with hypercapnia: Secondary | ICD-10-CM | POA: Diagnosis not present

## 2021-07-24 DIAGNOSIS — I63512 Cerebral infarction due to unspecified occlusion or stenosis of left middle cerebral artery: Secondary | ICD-10-CM | POA: Diagnosis not present

## 2021-07-24 DIAGNOSIS — I6602 Occlusion and stenosis of left middle cerebral artery: Secondary | ICD-10-CM | POA: Diagnosis not present

## 2021-07-24 DIAGNOSIS — I63412 Cerebral infarction due to embolism of left middle cerebral artery: Secondary | ICD-10-CM | POA: Diagnosis not present

## 2021-07-24 LAB — CBC
HCT: 26.8 % — ABNORMAL LOW (ref 39.0–52.0)
HCT: 27.9 % — ABNORMAL LOW (ref 39.0–52.0)
Hemoglobin: 8.5 g/dL — ABNORMAL LOW (ref 13.0–17.0)
Hemoglobin: 8.7 g/dL — ABNORMAL LOW (ref 13.0–17.0)
MCH: 29.9 pg (ref 26.0–34.0)
MCH: 29.4 pg (ref 26.0–34.0)
MCHC: 31.7 g/dL (ref 30.0–36.0)
MCHC: 31.2 g/dL (ref 30.0–36.0)
MCV: 94.4 fL (ref 80.0–100.0)
MCV: 94.3 fL (ref 80.0–100.0)
Platelets: 289 10*3/uL (ref 150–400)
Platelets: 299 10*3/uL (ref 150–400)
RBC: 2.84 MIL/uL — ABNORMAL LOW (ref 4.22–5.81)
RBC: 2.96 MIL/uL — ABNORMAL LOW (ref 4.22–5.81)
RDW: 18.7 % — ABNORMAL HIGH (ref 11.5–15.5)
RDW: 18.7 % — ABNORMAL HIGH (ref 11.5–15.5)
WBC: 8 10*3/uL (ref 4.0–10.5)
WBC: 7 10*3/uL (ref 4.0–10.5)
nRBC: 0.2 % (ref 0.0–0.2)
nRBC: 0.4 % — ABNORMAL HIGH (ref 0.0–0.2)

## 2021-07-24 LAB — BASIC METABOLIC PANEL
Anion gap: 12 (ref 5–15)
BUN: 21 mg/dL (ref 8–23)
CO2: 24 mmol/L (ref 22–32)
Calcium: 8.7 mg/dL — ABNORMAL LOW (ref 8.9–10.3)
Chloride: 105 mmol/L (ref 98–111)
Creatinine, Ser: 1.09 mg/dL (ref 0.61–1.24)
GFR, Estimated: >60 mL/min (ref >60)
Glucose, Bld: 183 mg/dL — ABNORMAL HIGH (ref 70–99)
Potassium: 4.2 mmol/L (ref 3.5–5.1)
Sodium: 141 mmol/L (ref 135–145)

## 2021-07-24 LAB — HEPARIN LEVEL (UNFRACTIONATED): Heparin Unfractionated: 0.33 IU/mL (ref 0.30–0.70)

## 2021-07-24 LAB — GLUCOSE, CAPILLARY
Glucose-Capillary: 146 mg/dL — ABNORMAL HIGH (ref 70–99)
Glucose-Capillary: 213 mg/dL — ABNORMAL HIGH (ref 70–99)
Glucose-Capillary: 237 mg/dL — ABNORMAL HIGH (ref 70–99)
Glucose-Capillary: 179 mg/dL — ABNORMAL HIGH (ref 70–99)
Glucose-Capillary: 228 mg/dL — ABNORMAL HIGH (ref 70–99)

## 2021-07-24 MED ORDER — INSULIN ASPART 100 UNIT/ML IJ SOLN
6.0000 [IU] | INTRAMUSCULAR | Status: DC
  Administered 2021-07-24 – 2021-07-25 (×7): 6 [IU] via SUBCUTANEOUS

## 2021-07-24 NOTE — Progress Notes (Signed)
ANTICOAGULATION CONSULT NOTE  Pharmacy Consult for Heparin Indication: Acute bilateral PEs and history of DVT  No Known Allergies  Patient Measurements: Height: 5\' 9"  (175.3 cm) Weight: 101.4 kg (223 lb 8.7 oz) IBW/kg (Calculated) : 70.7  Heparin Dosing Weight: 91 kg  Vital Signs: Temp: 99.1 F (37.3 C) (12/28 0800) Temp Source: Axillary (12/28 0800) BP: 150/74 (12/28 0900) Pulse Rate: 88 (12/28 0900)  Labs: Recent Labs    07/22/21 0203 07/22/21 0526 07/22/21 0855 07/23/21 0623 07/23/21 0913 07/23/21 1619 07/24/21 0556  HGB  --   --    < > 8.2* 8.1* 8.3* 8.5*  HCT  --   --    < > 26.4* 26.4* 27.5* 26.8*  PLT  --   --    < > 234 240 273 289  HEPARINUNFRC 0.42  --   --  0.39  --   --  0.33  CREATININE  --  1.16  --  1.18  --   --  1.09   < > = values in this interval not displayed.     Estimated Creatinine Clearance: 65.6 mL/min (by C-G formula based on SCr of 1.09 mg/dL).  Assessment: 33 YOM presented with right-sided weakness and aphagia, underwent revascularization of L MCA.  Also found to have acute bilateral PEs in setting of recent DVT, so Pharmacy consulted to dose IV heparin. He was on Eliquis PTA, but taken off on 12/18 for EGD.   Patient taken to IR 12/21 pm for PE thrombectomy and unable to aspirate significant clot. Patient with loss of pulses and coded in IR. Pt with multiple relative contraindications to lytics but decision made to give tenecteplase 12/22 ~0100.  Heparin resumed 12/22.   Heparin level therapeutic and stable on 1600 units/hr. Hgb significantly dropped 12/23 and CT without evidence of retroperitoneal hematoma.  Patient has been having dark bowel movements and CCM wanted to keep heparin running for now. MRI head showed no s/s of bleed.  CBC stable.  Goal of Therapy:  Heparin level 0.3-0.5 units/ml Monitor platelets by anticoagulation protocol: Yes  Plan:  Continue heparin infusion at 1600 units/hr Daily heparin level and CBC   Idrees Quam D.  1/24, PharmD, BCPS, BCCCP 07/24/2021, 10:00 AM

## 2021-07-24 NOTE — Progress Notes (Signed)
Nutrition Follow-up  DOCUMENTATION CODES:   Not applicable  INTERVENTION:   Tube feeding via OG tube: Vital 1.5 at 60 ml/h (1440 ml per day) Prosource TF 45 ml TID  Provides 2280 kcal, 130 gm protein, 1094 ml free water daily    NUTRITION DIAGNOSIS:   Inadequate oral intake related to inability to eat as evidenced by NPO status. Ongoing.   GOAL:   Patient will meet greater than or equal to 90% of their needs Met with TF at goal  MONITOR:   TF tolerance, Labs  REASON FOR ASSESSMENT:   Consult, Ventilator Enteral/tube feeding initiation and management  ASSESSMENT:   Pt with PMH of OSA, CVA, on eliquis for recent discovered DVT, IDA, PMR, OSA. Eliquis held 12/18 for planned EGD for AVM however admitted with R sided weakness, aphasia and new L M1 occlusion.   Noted family discussion 12/27 with Palliative care, no plans for trach/PEG. Plan for another family meeting today 12/28.   12/21 s/p IR for mechanical thrombectomy L M1 with TICI 3 revascularization. Pt with PEA arrest in IR due to massive PE 12/22 started on arctic sun/normothermia ; s/p bronch    Patient is currently intubated on ventilator support Temp (24hrs), Avg:99.3 F (37.4 C), Min:98.9 F (37.2 C), Max:99.7 F (37.6 C)   Medications reviewed and include: colace, SSI, novolog, protonix, miralax, prednisone NS @ 50 ml/hr  Labs reviewed: CBG's: 146-213 A1C: 7.4   17F OG tube: per xray feeding tube coursing below the diaphragm     Diet Order:   Diet Order             Diet NPO time specified  Diet effective now                   EDUCATION NEEDS:   Not appropriate for education at this time  Skin:  Skin Assessment: Reviewed RN Assessment  Last BM:  12/27  Height:   Ht Readings from Last 1 Encounters:  07/14/2021 5' 9" (1.753 m)    Weight:   Wt Readings from Last 1 Encounters:  07/24/21 101.4 kg    BMI:  Body mass index is 33.01 kg/m.  Estimated Nutritional Needs:    Kcal:  2200  Protein:  120-140 grams  Fluid:  >2 L/day  Lockie Pares., RD, LDN, CNSC See AMiON for contact information

## 2021-07-24 NOTE — Progress Notes (Signed)
NAME:  Jaime Whitaker, MRN:  [id number], DOB:  [birth year], LOS: 7 ADMISSION DATE:  08-Aug-2021, CONSULTATION DATE:  08/08/2021 REFERRING MD:  Corliss Skains, CHIEF COMPLAINT:  weakness   History of Present Illness:  80yM on eliquis for ?recently discovered DVT, IDA, PMR, OSA, CVA in 1994. History is obtained through chart review and discussion with the patient's wife. He was planning to undergo EGD for intervention on AVM felt to be potential culprit for IDA today and had held eliquis since 12/18. Today had R sided weakness, aphasia and gaze deviation.  He was also noted to be hypoxic in ED requiring NRB. Found to have L M1 occlusion now s/p aspiration with TICI 3 revascularization. Remained hypoxic after reversal of NMB, left intubated and arrived to neuro ICU on 100 of neo, 50 of propofol, saline infusion at 75 mL/hr  Pertinent  Medical History  DVT IDA PMR OSA CVA  Significant Hospital Events: Including procedures, antibiotic start and stop dates in addition to other pertinent events   08/08/2021 intubated, mechanical thrombectomy L M1 with TICI 3 revascularization 12/22 mechanical thrombectomy c/b 30 min in and out of PEA arrest, systemic TNK for PE. Made DNR after discussion with family. Normothermia protocol initiated 12/24 left femoral central line placed 12/25 GI bleeding but HDS and has known AVM continuing heparin, MR brain with extensive ischemic burden 12/28 weaning on pressure support 5/5 but mental status is poor  Interim History / Subjective:   No acute events overnight.  Weaning on pressure support.  Mental status continues to be poor  Objective   Blood pressure (!) 150/74, pulse 88, temperature 99.1 F (37.3 C), temperature source Axillary, resp. rate 20, height 5\' 9"  (1.753 m), weight 101.4 kg, SpO2 100 %.    Vent Mode: PSV;CPAP FiO2 (%):  [40 %] 40 % Set Rate:  [16 bmp] 16 bmp Vt Set:  [560 mL] 560 mL PEEP:  [5 cmH20] 5 cmH20 Pressure Support:  [5 cmH20] 5  cmH20 Plateau Pressure:  [18 cmH20-19 cmH20] 18 cmH20   Intake/Output Summary (Last 24 hours) at 07/24/2021 1009 Last data filed at 07/24/2021 0900 Gross per 24 hour  Intake 2805.98 ml  Output 4275 ml  Net -1469.02 ml   Filed Weights   07/21/21 0500 07/23/21 0411 07/24/21 0400  Weight: 99 kg 106 kg 101.4 kg    Examination: Blood pressure (!) 150/74, pulse 88, temperature 99.1 F (37.3 C), temperature source Axillary, resp. rate 20, height 5\' 9"  (1.753 m), weight 101.4 kg, SpO2 100 %. Gen:      No acute distress HEENT:  EOMI, sclera anicteric Neck:     No masses; no thyromegaly, ETT Lungs:    Clear to auscultation bilaterally; normal respiratory effort CV:         Regular rate and rhythm; no murmurs Abd:      + bowel sounds; soft, non-tender; no palpable masses, no distension Ext:    No edema; adequate peripheral perfusion Skin:      Warm and dry; no rash Neuro: Unresponsive  Labs/ Imaging reviewed Labs are stable, no new imaging  Resolved Hospital Problem list     Assessment & Plan:  Left M1 occlusion s/p thrombectomy, systemic TNKase PEA arrest with anoxic brain injury S/p normothermia protocol Continue supportive care Continue heparin drip  Acute blood loss anemia, possible GI bleed Has known AVM, hemoglobin has remained stable Monitor CBC while he is on anticoagulation Continue Protonix  Acute hypoxic respiratory failure, bilateral PE, DVT History of OSA  Continue vent support, heparin drip  PMR Continue daily prednisone  Non-oliguric AKI vs CKD Follow urine output and creatinine  Best Practice (right click and "Reselect all SmartList Selections" daily)   Diet/type: tubefeeds DVT prophylaxis: systemic heparin GI prophylaxis: PPI Lines: Central line Foley:  Yes, and it is still needed Code Status:  full code Last date of multidisciplinary goals of care discussion [Palliative care involved, appreciate assistance. Made DNR after discussion post arrest  12/22.  Family leaning towards full comfort per last palliative note.]  Critical care time:    The patient is critically ill with multiple organ system failure and requires high complexity decision making for assessment and support, frequent evaluation and titration of therapies, advanced monitoring, review of radiographic studies and interpretation of complex data.   Critical Care Time devoted to patient care services, exclusive of separately billable procedures, described in this note is 35 minutes.   Chilton Greathouse MD [facility] See Amion for pager  If no response to pager , please call [phone] until 7pm After 7:00 pm call Elink  [phone] 07/24/2021, 10:09 AM

## 2021-07-24 NOTE — Progress Notes (Addendum)
STROKE TEAM PROGRESS NOTE   INTERVAL HISTORY Patient is seen in his room with family at the bedside.  He has been hemodynamically stable, and his neurological exam has been stable.  Family meeting was held yesterday, and family members are leaning towards comfort care.  Additional family meeting scheduled with palliative care today at 1700.  Vitals:   07/24/21 1100 07/24/21 1200 07/24/21 1212 07/24/21 1300  BP: (!) 150/75 (!) 143/71 (!) 143/71 (!) 144/67  Pulse: 90 86 85 89  Resp: 20 20 (!) 23 19  Temp:  99.2 F (37.3 C)    TempSrc:  Axillary    SpO2: 100% 100% 100% 100%  Weight:      Height:       CBC:  Recent Labs  Lab 07/18/21 0523 07/19/21 0455 07/24/21 0556 07/24/21 0953  WBC 16.4*   < > 8.0 7.0  NEUTROABS 14.0*  --   --   --   HGB 12.6*   < > 8.5* 8.7*  HCT 40.4   < > 26.8* 27.9*  MCV 93.7   < > 94.4 94.3  PLT 131*   < > 289 299   < > = values in this interval not displayed.    Basic Metabolic Panel:  Recent Labs  Lab 07/20/21 0405 07/21/21 0611 07/22/21 0526 07/23/21 0623 07/24/21 0556  NA 140 141   < > 140 141  K 3.4* 3.9   < > 4.3 4.2  CL 110 111   < > 109 105  CO2 21* 23   < > 25 24  GLUCOSE 206* 183*   < > 144* 183*  BUN 32* 23   < > 18 21  CREATININE 1.55* 1.28*   < > 1.18 1.09  CALCIUM 7.7* 8.1*   < > 8.6* 8.7*  MG 2.0 2.1  --   --   --   PHOS 1.9* 2.6  --   --   --    < > = values in this interval not displayed.    Lipid Panel:  Recent Labs  Lab 07/18/21 0523 07/19/21 0455 07/22/21 0526  CHOL 131  --   --   TRIG 98   < > 125  HDL 40*  --   --   CHOLHDL 3.3  --   --   VLDL 20  --   --   LDLCALC 71  --   --    < > = values in this interval not displayed.    HgbA1c:  Recent Labs  Lab 07/18/21 0523  HGBA1C 7.4*    Urine Drug Screen: No results for input(s): LABOPIA, COCAINSCRNUR, LABBENZ, AMPHETMU, THCU, LABBARB in the last 168 hours.  Alcohol Level  No results for input(s): ETH in the last 168 hours.   IMAGING past 24  hours No results found.  PHYSICAL EXAM General - Well nourished, well developed, intubated off sedation.  Cardiovascular - Regular rate and rhythm.   Neuro - intubated off sedation, Pupils equal and sluggishly reactive,  cough and corneal reflexes present.  Doll's eyes reflex absent.  Will withdraw LUE to noxious stimuli, unresponsive to noxious stimuli in other extremities.  ASSESSMENT/Whitaker [name] is a 80 y.o. male with history of HTN, anemia, sleep apnea, stroke in 1998, HLD, eliquis use for DVT in RLE up until 12/18 presenting with right sided weakness and aphasia. Patient presented with aphasia and right hemiplegia and yesterday underwent emergent mechanical thrombectomy for left M1 occlusion with excellent  TICI 3 revascularization but unfortunately last night developed massive pulmonary embolism with low blood pressure developed bradycardic PEA arrest during pulmonary thrombectomy procedure requiring 30 minutes of CPR before  ROSC.  He has remained unresponsive and on full ventilatory support and vasopressor support. Developed massive PE and went to IR overnight 12/21 for a pulmonary arterial thrombectomy with a subsequent PEA arrest in IR.  He was treated with IV TNKase for systemic thrombolysis.  CCM initiated arctic sun for possible hypoxic brian injury and an overnight EEG. MRI after completion of hypothermia shows extensive involvement of the left cerebral hemisphere in the left MCA territory, left PCA territory and left watershed territories. Involvement of multiple vascular territories raises the possibility of an embolic process. Superimposed acute hypoxic/ischemic injury cannot be excluded.  Etiology for patient stroke involving pattern, source unclear but likely related to hypercoagulable state given history of DVT and recurrent PE.  However, with extensive stroke and anoxic brain injury on MRI, patient poor neuro exam without sedation, likely poor prognosis. Patient remains on  IV heparin infusion and ASA 81. No sedation on board currently.  Stroke: embolic shower but largest at left MCA with L M1 occlusion s/p IR with Left M1 TICI3 but complicated by bilateral PE and PEA arrest s/p  IV TNK and unsuccessful thrombectomy Code Stroke- Hyperdense L MCA, chronic small-vessel ischemic changes of white matter. Old thalamic infarction  CTA head & neck-Occlusion of L MCA at mid M1. Mild atherosclerotic plaque.  CT perfusion- Left MCA vascular territory totally , no core infarct  MRI  Extensive acute/subacute infarcts within the supratentorial and infratentorial brain, greatest involvement of L MCA, L PCA, and L watershed territory. Embolic with hypoxic/ischemic injury 2D Echo EF 60-65%, mild dilation of ascending aorta, interatrial septum not well visualized Right groin ultrasound done and shows hematoma. LDL 71 HgbA1c 7.4 EEG no seizure activity VTE prophylaxis - SCDs Eliquis (apixaban) daily but off for colonoscopy prior to admission, now on heparin IV.  Therapy recommendations:  pending Disposition:  pending, poor prognosis, repeat family meeting today  B/l PE s/p  IV TNK and unsuccessful thrombectomy PEA arrest during bilateral pulmonary artery thrombectomy Hypotension with bradycardia CPR 61m before ROSC Initiation of Arctic Sun by CCM MRI showed evidence of anoxic brain injury Poor prognosis family meeting for GOC discussion held yesterday, repeat family meeting today at 65 with palliative care  Respiratory failure Intubated for cardiac arrest resuscitation On vent Not candidate for extubation Vent management per CCM  Hypotension Likely due to PEA arrest Was on levophed, now no longer requiring vasopressors Home BP medication- metoprolol 50mg   Hyperlipidemia Home meds:  Lovastatin 20mg , Pravastatin 20 mg ordered in hospital LDL 71, goal < 70 Continue statin at discharge  Diabetes type II uncontrolled HgbA1c 7.4, goal < 7.0 SSI CBG  monitoring  Tobacco abuse Current smoker Smoking cessation counseling will be provided  Other Stroke Risk Factors Advanced Age >/= 3  Obesity, Body mass index is 33.01 kg/m., BMI >/= 30 associated with increased stroke risk, recommend weight loss, diet and exercise as appropriate  Hx of stroke in 1998 - no details Hx of DVT on eliquis Obstructive sleep apnea, on CPAP at home  Other Active Problems Acute blood loss anemia - Hb 7.6-> 8.2.  -> 8.1-> 8.7 Pan CT showed no retroperitoneal hematoma.  Continue monitoring. AKI creatinine 1.55-> 1.16-> 1.18 Leukocytosis WBC 11.4-> 7.7-> 6.8-> 8.7 -> 7.9-> 7.0   Hospital day # 7  Patient seen and examined by NP/APP with  MD. MD to update note as needed.   Jaime Whitaker , MSN, AGACNP-BC [facility] See Amion for schedule and pager information 07/24/2021 2:02 PM   ATTENDING NOTE: I reviewed above note and agree with the assessment and Whitaker. Pt was seen and examined.   Sister at bedside.  Patient still intubated, no significant neurological changes.  Still on heparin IV and aspirin.  Eyes open spontaneously, however not blinking to weak strike, not follow commands.  No movement at right upper extremity, flicker movement at left upper and bilateral lower extremities.  Whitaker for another family meeting 5 PM this afternoon.  From what I understand, family leaning towards to comfort care measures but has not made final decision yet.  For detailed assessment and Whitaker, please refer to above as I have made changes wherever appropriate.   Jaime Plan, MD PhD Stroke Neurology 07/24/2021 8:19 PM  This patient is critically ill due to respiratory failure, cardiac arrest, bilateral PE, embolic stroke and at significant risk of neurological worsening, death form recurrent cardiac arrest, recurrent stroke, heart failure, pulmonary hypertension, seizure and renal failure. This patient's care requires constant monitoring of vital signs,  hemodynamics, respiratory and cardiac monitoring, review of multiple databases, neurological assessment, discussion with family, other specialists and medical decision making of high complexity. I spent 35 minutes of neurocritical care time in the care of this patient. I had long discussion with sister at bedside, updated pt current condition, treatment Whitaker and potential prognosis, and answered all the questions.  She expressed understanding and appreciation.      To contact Stroke Continuity provider, please refer to [email or web address]. After hours, contact General Neurology

## 2021-07-25 DIAGNOSIS — I6602 Occlusion and stenosis of left middle cerebral artery: Secondary | ICD-10-CM | POA: Diagnosis not present

## 2021-07-25 DIAGNOSIS — I63412 Cerebral infarction due to embolism of left middle cerebral artery: Secondary | ICD-10-CM | POA: Diagnosis not present

## 2021-07-25 DIAGNOSIS — Z01818 Encounter for other preprocedural examination: Secondary | ICD-10-CM | POA: Diagnosis not present

## 2021-07-25 DIAGNOSIS — J9601 Acute respiratory failure with hypoxia: Secondary | ICD-10-CM | POA: Diagnosis not present

## 2021-07-25 DIAGNOSIS — I63512 Cerebral infarction due to unspecified occlusion or stenosis of left middle cerebral artery: Secondary | ICD-10-CM | POA: Diagnosis not present

## 2021-07-25 DIAGNOSIS — Z711 Person with feared health complaint in whom no diagnosis is made: Secondary | ICD-10-CM

## 2021-07-25 LAB — BASIC METABOLIC PANEL
Anion gap: 8 (ref 5–15)
BUN: 23 mg/dL (ref 8–23)
CO2: 24 mmol/L (ref 22–32)
Calcium: 9 mg/dL (ref 8.9–10.3)
Chloride: 104 mmol/L (ref 98–111)
Creatinine, Ser: 1.06 mg/dL (ref 0.61–1.24)
GFR, Estimated: >60 mL/min (ref >60)
Glucose, Bld: 202 mg/dL — ABNORMAL HIGH (ref 70–99)
Potassium: 4.7 mmol/L (ref 3.5–5.1)
Sodium: 136 mmol/L (ref 135–145)

## 2021-07-25 LAB — GLUCOSE, CAPILLARY
Glucose-Capillary: 220 mg/dL — ABNORMAL HIGH (ref 70–99)
Glucose-Capillary: 221 mg/dL — ABNORMAL HIGH (ref 70–99)
Glucose-Capillary: 185 mg/dL — ABNORMAL HIGH (ref 70–99)
Glucose-Capillary: 188 mg/dL — ABNORMAL HIGH (ref 70–99)

## 2021-07-25 LAB — HEPARIN LEVEL (UNFRACTIONATED): Heparin Unfractionated: 0.34 IU/mL (ref 0.30–0.70)

## 2021-07-25 LAB — CBC
HCT: 28.2 % — ABNORMAL LOW (ref 39.0–52.0)
Hemoglobin: 8.8 g/dL — ABNORMAL LOW (ref 13.0–17.0)
MCH: 29.6 pg (ref 26.0–34.0)
MCHC: 31.2 g/dL (ref 30.0–36.0)
MCV: 94.9 fL (ref 80.0–100.0)
Platelets: 370 10*3/uL (ref 150–400)
RBC: 2.97 MIL/uL — ABNORMAL LOW (ref 4.22–5.81)
RDW: 18.7 % — ABNORMAL HIGH (ref 11.5–15.5)
WBC: 8.4 10*3/uL (ref 4.0–10.5)
nRBC: 0.4 % — ABNORMAL HIGH (ref 0.0–0.2)

## 2021-07-25 LAB — TRIGLYCERIDES: Triglycerides: 137 mg/dL (ref <150)

## 2021-07-25 MED ORDER — INSULIN ASPART 100 UNIT/ML IJ SOLN
8.0000 [IU] | INTRAMUSCULAR | Status: DC
  Administered 2021-07-25 – 2021-07-26 (×4): 8 [IU] via SUBCUTANEOUS

## 2021-07-25 NOTE — Progress Notes (Signed)
ANTICOAGULATION CONSULT NOTE  Pharmacy Consult for Heparin Indication: Acute bilateral PEs and history of DVT  No Known Allergies  Patient Measurements: Height: 5\' 9"  (175.3 cm) Weight: 101 kg (222 lb 10.6 oz) IBW/kg (Calculated) : 70.7  Heparin Dosing Weight: 91 kg  Vital Signs: Temp: 99.4 F (37.4 C) (12/29 0357) Temp Source: Axillary (12/29 0357) BP: 138/77 (12/29 0600) Pulse Rate: 98 (12/29 0728)  Labs: Recent Labs    07/23/21 0623 07/23/21 0913 07/24/21 0556 07/24/21 0953 07/25/21 0655  HGB 8.2*   < > 8.5* 8.7* 8.8*  HCT 26.4*   < > 26.8* 27.9* 28.2*  PLT 234   < > 289 299 370  HEPARINUNFRC 0.39  --  0.33  --  0.34  CREATININE 1.18  --  1.09  --   --    < > = values in this interval not displayed.     Estimated Creatinine Clearance: 65.4 mL/min (by C-G formula based on SCr of 1.09 mg/dL).  Assessment: 26 YOM presented with right-sided weakness and aphagia, underwent revascularization of L MCA.  Also found to have acute bilateral PEs in setting of recent DVT, so Pharmacy consulted to dose IV heparin. He was on Eliquis PTA, but taken off on 12/18 for EGD.   Patient taken to IR 12/21 pm for PE thrombectomy and unable to aspirate significant clot. Patient with loss of pulses and coded in IR. Pt with multiple relative contraindications to lytics but decision made to give tenecteplase 12/22 ~0100.  Heparin resumed 12/22.   Heparin level therapeutic and stable on 1600 units/hr. Hgb significantly dropped 12/23 and CT without evidence of retroperitoneal hematoma.  Patient has been having dark bowel movements and CCM wanted to keep heparin running for now. MRI head showed no s/s of bleed.  CBC stable.  Goal of Therapy:  Heparin level 0.3-0.5 units/ml Monitor platelets by anticoagulation protocol: Yes  Plan:  Continue heparin infusion at 1600 units/hr Daily heparin level and CBC   Cheron Coryell D. 1/24, PharmD, BCPS, BCCCP 07/25/2021, 7:52 AM

## 2021-07-25 NOTE — Progress Notes (Signed)
Inpatient Diabetes Program Recommendations  AACE/ADA: New Consensus Statement on Inpatient Glycemic Control   Target Ranges:  Prepandial:   less than 140 mg/dL      Peak postprandial:   less than 180 mg/dL (1-2 hours)      Critically ill patients:  140 - 180 mg/dL    Latest Reference Range & Units 07/25/21 03:58 07/25/21 07:20  Glucose-Capillary 70 - 99 mg/dL 638 (H) 937 (H)    Latest Reference Range & Units 07/24/21 07:53 07/24/21 11:45 07/24/21 15:58 07/24/21 23:14  Glucose-Capillary 70 - 99 mg/dL 342 (H) 876 (H) 811 (H) 221 (H)   Review of Glycemic Control  Current orders for Inpatient glycemic control: Novolog 0-15 units Q4H, Novolog 6 units Q4H for tube feeding coverage; Prednisone 5 mg daily, Vital @ 60 ml/hr  Inpatient Diabetes Program Recommendations:    Insulin: Please consider increasing Novolog tube feeding coverage to Novolog 8 units Q4H.  Thanks, Orlando Penner, RN, MSN, CDE Diabetes Coordinator Inpatient Diabetes Program [phone] (Team Pager from 8am to 5pm)

## 2021-07-25 NOTE — Progress Notes (Signed)
Pt placed back on full ventilator support for night rest. 

## 2021-07-25 NOTE — Progress Notes (Addendum)
STROKE TEAM PROGRESS NOTE   INTERVAL HISTORY Patient is seen in his room with his daughter at the bedside.  He has been hemodynamically stable and his neurological exam is unchanged.  His family would like to wait another week before deciding to transition to comfort care in the hopes that he may show some recovery.  Vitals:   07/25/21 0900 07/25/21 1000 07/25/21 1200 07/25/21 1400  BP: 133/79 (!) 143/82 (!) 141/83 (!) 146/81  Pulse: 96 98 94 97  Resp: (!) 22 (!) 23 (!) 22 (!) 24  Temp:   99.5 F (37.5 C)   TempSrc:   Axillary   SpO2: 100% 100% 100% 100%  Weight:      Height:       CBC:  Recent Labs  Lab 07/24/21 0953 07/25/21 0655  WBC 7.0 8.4  HGB 8.7* 8.8*  HCT 27.9* 28.2*  MCV 94.3 94.9  PLT 299 370    Basic Metabolic Panel:  Recent Labs  Lab 07/20/21 0405 07/21/21 0611 07/22/21 0526 07/24/21 0556 07/25/21 0655  NA 140 141   < > 141 136  K 3.4* 3.9   < > 4.2 4.7  CL 110 111   < > 105 104  CO2 21* 23   < > 24 24  GLUCOSE 206* 183*   < > 183* 202*  BUN 32* 23   < > 21 23  CREATININE 1.55* 1.28*   < > 1.09 1.06  CALCIUM 7.7* 8.1*   < > 8.7* 9.0  MG 2.0 2.1  --   --   --   PHOS 1.9* 2.6  --   --   --    < > = values in this interval not displayed.    Lipid Panel:  Recent Labs  Lab 07/25/21 0655  TRIG 137    HgbA1c:  No results for input(s): HGBA1C in the last 168 hours.  Urine Drug Screen: No results for input(s): LABOPIA, COCAINSCRNUR, LABBENZ, AMPHETMU, THCU, LABBARB in the last 168 hours.  Alcohol Level  No results for input(s): ETH in the last 168 hours.   IMAGING past 24 hours No results found.  PHYSICAL EXAM General - Well nourished, well developed, intubated off sedation.  Cardiovascular - Regular rate and rhythm.   Neuro - intubated off sedation, Pupils equal and sluggishly reactive,  cough and corneal reflexes present.  Doll's eyes reflex absent.  Will withdraw LUE to noxious stimuli, unresponsive to noxious stimuli in other  extremities.  ASSESSMENT/PLAN Jaime Whitaker is a 80 y.o. male with history of HTN, anemia, sleep apnea, stroke in 1998, HLD, eliquis use for DVT in RLE up until 12/18 presenting with right sided weakness and aphasia. Patient presented with aphasia and right hemiplegia and yesterday underwent emergent mechanical thrombectomy for left M1 occlusion with excellent  TICI 3 revascularization but unfortunately last night developed massive pulmonary embolism with low blood pressure developed bradycardic PEA arrest during pulmonary thrombectomy procedure requiring 30 minutes of CPR before  ROSC.  He has remained unresponsive and on full ventilatory support and vasopressor support. Developed massive PE and went to IR overnight 12/21 for a pulmonary arterial thrombectomy with a subsequent PEA arrest in IR.  He was treated with IV TNKase for systemic thrombolysis.  CCM initiated arctic sun for possible hypoxic brian injury and an overnight EEG. MRI after completion of hypothermia shows extensive involvement of the left cerebral hemisphere in the left MCA territory, left PCA territory and left watershed territories. Involvement of  multiple vascular territories raises the possibility of an embolic process. Superimposed acute hypoxic/ischemic injury cannot be excluded.  Etiology for patient stroke involving pattern, source unclear but likely related to hypercoagulable state given history of DVT and recurrent PE.  However, with extensive stroke and anoxic brain injury on MRI, patient poor neuro exam without sedation, likely poor prognosis. Patient remains on IV heparin infusion and ASA 81. No sedation on board currently.  Stroke: embolic shower but largest at left MCA with L M1 occlusion s/p IR with Left M1 TICI3 but complicated by bilateral PE and PEA arrest s/p  IV TNK and unsuccessful thrombectomy Code Stroke- Hyperdense L MCA, chronic small-vessel ischemic changes of white matter. Old thalamic infarction  CTA head &  neck-Occlusion of L MCA at mid M1. Mild atherosclerotic plaque.  CT perfusion- Left MCA vascular territory totally , no core infarct  MRI  Extensive acute/subacute infarcts within the supratentorial and infratentorial brain, greatest involvement of L MCA, L PCA, and L watershed territory. Embolic with hypoxic/ischemic injury 2D Echo EF 60-65%, mild dilation of ascending aorta, interatrial septum not well visualized Right groin ultrasound done and shows hematoma. LDL 71 HgbA1c 7.4 EEG no seizure activity VTE prophylaxis - SCDs Eliquis (apixaban) daily but off for colonoscopy prior to admission, now on heparin IV.  Therapy recommendations:  pending Disposition:  pending, poor prognosis, repeat family meeting today  B/l PE s/p  IV TNK and unsuccessful thrombectomy PEA arrest during bilateral pulmonary artery thrombectomy Hypotension with bradycardia CPR 34m before ROSC Initiation of Arctic Sun by CCM MRI showed evidence of anoxic brain injury Poor prognosis family meeting for GOC discussion held yesterday, repeat family meeting today at 80 with palliative care  Respiratory failure Intubated for cardiac arrest resuscitation On vent Not candidate for extubation Vent management per CCM  Hypotension Likely due to PEA arrest Was on levophed, now no longer requiring vasopressors Home BP medication- metoprolol 50mg   Hyperlipidemia Home meds:  Lovastatin 20mg , Pravastatin 20 mg ordered in hospital LDL 71, goal < 70 Continue statin at discharge  Diabetes type II uncontrolled HgbA1c 7.4, goal < 7.0 SSI CBG monitoring  Tobacco abuse Current smoker Smoking cessation counseling will be provided  Other Stroke Risk Factors Advanced Age >/= 68  Obesity, Body mass index is 32.88 kg/m., BMI >/= 30 associated with increased stroke risk, recommend weight loss, diet and exercise as appropriate  Hx of stroke in 1998 - no details Hx of DVT on eliquis Obstructive sleep apnea, on CPAP  at home  Other Active Problems Acute blood loss anemia - Hb 7.6-> 8.2.  -> 8.1-> 8.7-> 8.8 Pan CT showed no retroperitoneal hematoma.  Continue monitoring. AKI creatinine 1.55-> 1.16-> 1.18-> 1.06 Leukocytosis WBC 11.4-> 7.7-> 6.8-> 8.7 -> 7.9-> 7.0-> 8.4   Hospital day # 8  Patient seen and examined by NP/APP with MD. MD to update note as needed.   Jaime Whitaker 78 , MSN, AGACNP-BC [facility] See Amion for schedule and pager information 07/25/2021 3:10 PM  ATTENDING NOTE: I reviewed above note and agree with the assessment and plan. Pt was seen and examined.   Daughter at bedside.  Patient still intubated on ventilation, still on heparin and aspirin 81, no significant neuro changes.  Had family meeting yesterday again regarding GOC discussion, however patient family agreed no trach and PEG but asking for 14 days post intubation to give patient more time.  I told the daughter that if the purpose is for comfort of the patient,  it will not make no sense to delay the process and prolong the suffering.  Daughter understood and she would discuss with other family members.  For detailed assessment and plan, please refer to above as I have made changes wherever appropriate.   Marvel Plan, MD PhD Stroke Neurology 07/25/2021 7:23 PM  This patient is critically ill due to respiratory failure, cardiac arrest, bilateral PE, embolic stroke and at significant risk of neurological worsening, death form recurrent cardiac arrest, recurrent stroke, heart failure, pulmonary hypertension, seizure and renal failure. This patient's care requires constant monitoring of vital signs, hemodynamics, respiratory and cardiac monitoring, review of multiple databases, neurological assessment, discussion with family, other specialists and medical decision making of high complexity. I spent 35 minutes of neurocritical care time in the care of this patient. I had long discussion with daughter at  bedside, updated pt current condition, treatment plan and potential prognosis, and answered all the questions.  She expressed understanding and appreciation.      To contact Stroke Continuity provider, please refer to [email or web address]. After hours, contact General Neurology

## 2021-07-25 NOTE — Progress Notes (Signed)
NAME:  Jaime Whitaker, MRN:  [id number], DOB:  [birth year], LOS: 8 ADMISSION DATE:  07/13/2021, CONSULTATION DATE:  07/03/2021 REFERRING MD:  Corliss Skains, CHIEF COMPLAINT:  weakness   History of Present Illness:  80yM on eliquis for ?recently discovered DVT, IDA, PMR, OSA, CVA in 1994. History is obtained through chart review and discussion with the patient's wife. He was planning to undergo EGD for intervention on AVM felt to be potential culprit for IDA today and had held eliquis since 12/18. Today had R sided weakness, aphasia and gaze deviation.  He was also noted to be hypoxic in ED requiring NRB. Found to have L M1 occlusion now s/p aspiration with TICI 3 revascularization. Remained hypoxic after reversal of NMB, left intubated and arrived to neuro ICU on 100 of neo, 50 of propofol, saline infusion at 75 mL/hr  Pertinent  Medical History  DVT IDA PMR OSA CVA  Significant Hospital Events: Including procedures, antibiotic start and stop dates in addition to other pertinent events   07/18/2021 intubated, mechanical thrombectomy L M1 with TICI 3 revascularization 12/22 mechanical thrombectomy c/b 30 min in and out of PEA arrest, systemic TNK for PE. Made DNR after discussion with family. Normothermia protocol initiated 12/24 left femoral central line placed 12/25 GI bleeding but HDS and has known AVM continuing heparin, MR brain with extensive ischemic burden 12/28 weaning on pressure support 5/5 but mental status is poor  Interim History / Subjective:   No acute events overnight.  Ongoing palliative care discussions regarding goals of care  Objective   Blood pressure 138/77, pulse 98, temperature 99.4 F (37.4 C), temperature source Axillary, resp. rate (!) 25, height 5\' 9"  (1.753 m), weight 101 kg, SpO2 100 %.    Vent Mode: PSV;CPAP FiO2 (%):  [40 %] 40 % Set Rate:  [16 bmp] 16 bmp Vt Set:  [560 mL] 560 mL PEEP:  [5 cmH20] 5 cmH20 Pressure Support:  [5 cmH20] 5 cmH20 Plateau  Pressure:  [14 cmH20-19 cmH20] 18 cmH20   Intake/Output Summary (Last 24 hours) at 07/25/2021 0751 Last data filed at 07/25/2021 0600 Gross per 24 hour  Intake 2546.4 ml  Output 1600 ml  Net 946.4 ml   Filed Weights   07/23/21 0411 07/24/21 0400 07/25/21 0500  Weight: 106 kg 101.4 kg 101 kg    Examination: Gen:      No acute distress HEENT:  EOMI, sclera anicteric Neck:     No masses; no thyromegaly, ETT Lungs:    Clear to auscultation bilaterally; normal respiratory effort CV:         Regular rate and rhythm; no murmurs Abd:      + bowel sounds; soft, non-tender; no palpable masses, no distension Ext:    No edema; adequate peripheral perfusion Skin:      Warm and dry; no rash Neuro: alert and oriented x 3 Psych: Sedated, unresponsive  Labs/ Imaging reviewed Labs are stable, no new imaging  Resolved Hospital Problem list     Assessment & Plan:  Left M1 occlusion s/p thrombectomy, systemic TNKase PEA arrest with anoxic brain injury S/p normothermia protocol Continue supportive care Continue heparin drip  Acute blood loss anemia, possible GI bleed Has known AVM, hemoglobin has remained stable Monitor CBC while he is on anticoagulation Continue Protonix  Acute hypoxic respiratory failure, bilateral PE, DVT History of OSA Continue vent support, heparin drip  PMR Continue daily prednisone  Non-oliguric AKI vs CKD Follow urine output and creatinine  Best Practice (right  click and "Reselect all SmartList Selections" daily)   Diet/type: tubefeeds DVT prophylaxis: systemic heparin GI prophylaxis: PPI Lines: Central line Foley:  Yes, and it is still needed Code Status:  full code Last date of multidisciplinary goals of care discussion [Palliative care involved, appreciate assistance. Made DNR after discussion post arrest 12/22.  Per palliative care notes family wants to wait 14 days before making decision.]  Critical care time:    The patient is critically ill  with multiple organ system failure and requires high complexity decision making for assessment and support, frequent evaluation and titration of therapies, advanced monitoring, review of radiographic studies and interpretation of complex data.   Critical Care Time devoted to patient care services, exclusive of separately billable procedures, described in this note is 35 minutes.   Chilton Greathouse MD [facility] See Amion for pager  If no response to pager , please call [phone] until 7pm After 7:00 pm call Elink  [phone] 07/25/2021, 8:02 AM

## 2021-07-25 NOTE — Progress Notes (Signed)
Daily Progress Note   Patient Name: Jaime Whitaker       Date: 07/25/2021 DOB: Sep 24, 1940  Age: 80 y.o. MRN#: [id number] Attending Physician: Stroke, Md, MD Primary Care Physician: Caryl Bis, MD Admit Date: 06/29/2021  Reason for Consultation/Follow-up: Establishing goals of care  Subjective: Chart review performed. Received report from primary RN - no acute concerns.  5:00 PM Met 7 family members for scheduled follow up meeting in [place] conference room.   Detailed discussion on what transition to comfort measures in house would entail inclusive of medications to control pain, dyspnea, agitation, nausea, and itching. We discussed stopping all unnecessary measures such as blood draws, needle sticks, oxygen, antibiotics, CBGs/insulin, cardiac monitoring, IVF, and frequent vital signs. Discussed extubation/oxygen use at EOL as well as symptom management plan if/when compassionately extubated. Natural trajectory at EOL was reviewed in detail.   Medical questions answered to the best of my ability pertaining to extent of brain tissue damage (per Neuro's updates yesterday) as well as bilateral PEs. Detailed discussion around patient's multi-organ system failure and the tediousness of managing each in context of the holistic picture. Family understand patient is in a very fragile state.   Again reviewed likely best case long -term scenario pursing aggressive interventions, which would include trach/PEG/LTACH. Family explain they have seen patient move extremities and are hopeful he may make a recovery. Education provided on the difference between reflex movements vs purposeful movements. Wife is still clear she does not feel patient would want trach/PEG.   Questions answered around possibility of  extubating to "see how he does." At this time family are aware, if the goal is to focus on life prolonging measures, extubation without trach in place is not recommended as it is anticipated he would decline. Reviewed per their request (if patient were to become stable enough medical team may try to wean vent) anticipated course if extubated to: either continue breathing on his own vs acute decline. Education provided on increased likelihood of needing long term ventilatory support/poor prognosis if patient would need re-intubation. Family express they would not want patient re-intubated.  Family question why "14 days" was mentioned in context of his situation. Education provided on increased ventilator complications after this period of time. Reviewed that intubation is not considered a long term intervention due to the risk  of these complications. They understand if goal is to prolong life at/around day 14, tracheostomy/PEG procedure would be next steps vs transition to comfort care path and letting nature take it's course. They understand decisions will need to be made in the coming week.   Family are clear they want to allow more time for outcomes. They are agreeable to follow up family meeting with PMT Friday, but likely will not have final decision until day 14 on vent, which is 07/31/21 - as indicated by their statement "we have a week."   Visit also consisted of discussions dealing with the complex and emotionally intense issues of symptom management and palliative care in the setting of serious and potentially life-threatening illness.   All questions and concerns addressed. Encouraged to call with questions and/or concerns. PMT card previously provided.    Length of Stay: 8 Current Medications: Scheduled Meds:   aspirin  81 mg Per Tube Daily   brimonidine  1 drop Both Eyes Daily   chlorhexidine gluconate (MEDLINE KIT)  15 mL Mouth Rinse BID   Chlorhexidine Gluconate Cloth  6 each Topical  Daily   docusate  100 mg Per Tube BID   feeding supplement (PROSource TF)  45 mL Per Tube TID   insulin aspart  0-15 Units Subcutaneous Q4H   insulin aspart  6 Units Subcutaneous Q4H   latanoprost  1 drop Both Eyes QHS   mouth rinse  15 mL Mouth Rinse 10 times per day   pantoprazole (PROTONIX) IV  40 mg Intravenous Q12H   polyethylene glycol  17 g Per Tube Daily   pravastatin  20 mg Per Tube q1800   predniSONE  5 mg Per Tube Daily    Continuous Infusions:  sodium chloride 50 mL/hr at 07/25/21 0001   sodium chloride     feeding supplement (VITAL 1.5 CAL) 1,000 mL (07/25/21 0000)   heparin 1,600 Units/hr (07/24/21 2300)   propofol (DIPRIVAN) infusion 10 mcg/kg/min (07/21/21 1823)    PRN Meds: acetaminophen **OR** acetaminophen (TYLENOL) oral liquid 160 mg/5 mL **OR** acetaminophen, fentaNYL (SUBLIMAZE) injection, ondansetron (ZOFRAN) IV         Vital Signs: BP 139/70    Pulse 100    Temp 99.8 F (37.7 C) (Axillary)    Resp 20    Ht 5' 9"  (1.753 m)    Wt 101.4 kg    SpO2 100%    BMI 33.01 kg/m  SpO2: SpO2: 100 % O2 Device: O2 Device: Ventilator O2 Flow Rate:    Intake/output summary:  Intake/Output Summary (Last 24 hours) at 07/25/2021 0008 Last data filed at 07/24/2021 2300 Gross per 24 hour  Intake 3267.97 ml  Output 1500 ml  Net 1767.97 ml   LBM: Last BM Date: 07/24/21 Baseline Weight: Weight: 104.3 kg Most recent weight: Weight: 101.4 kg       Palliative Assessment/Data: PPS 30% on tube feeds      Patient Active Problem List   Diagnosis Date Noted   Pulmonary embolus ([place])    Obstructive cardiovascular shock ([place])    Stroke (cerebrum) ([place]) 07/18/2021   Middle cerebral artery embolism, left 06/30/2021   Acute respiratory failure with hypoxia and hypercapnia ([facility])    H. pylori infection 24/26/8341   Helicobacter pylori gastritis 06/17/2021   Positive occult stool blood test 05/28/2021   Melena 05/28/2021   Iron deficiency anemia 05/28/2021   Fatigue  05/28/2021    Palliative Care Assessment & Plan   Patient Profile: 80 y.o. male  with  past medical history of hypertension and hyperlipidemia who presented to the emergency department on 07/04/2021 with right-sided weakness and right facial droop. Code stroke was called, but he was out of the window for TPA. He had been on Eliquis for a DVT, but was taken off it 12/18 for a GI procedure.  CTA head showed occlusion of the left middle cerebral artery at the mid M1 segment. He underwent complete revascularization of the occluded left MCA and was left intubated as he was unable to maintain adequate O2 saturation after anesthesia reversal.  CTA chest showed very large acute bilateral pulmonary emboli , with near complete occlusion of the right pulmonary artery and left pulmonary artery saddle emboli extending substantially in the left upper and lower lobe. Also showed cardiomegaly indicating right heart strain and ascending thoracic aortic aneurysm.    12/22 - he underwent pulmonary angiogram and bilateral pulmonary artery aspiration thrombectomy. During the procedure, patient became bradycardic with loss of pulse - he was resuscitated with ROSC. Systemic tPA was administered due to inability to aspirate significant clot burden.  12/23 - EEG negative for seizures, but shows moderate to severe degree of encephalopathy.   Assessment: Left M1 occlusion s/p thrombectomy PEA arrest Anoxic brain injury Acute blood loss anemia Acute hypoxic respiratory failure Bilateral PE/DVT Non-oliguric AKI vs CKD  Recommendations/Plan: Continue current medical interventions with watchful waiting - family wants to allow more time for outcomes Continue DNR/DNI as previously documented Per conversation today, family will likely not have final decision until 07/31/21, which is patient's day 14 on the vent, unless notable decline. They understand by that time goals would need to be clear for trach/PEG vs comfort  care Family reiterate today patient would not want trach/PEG Family see patient's movements and are hopeful this means he is improving - recommend continued education around reflex movements vs purposeful movements Family are open for another meeting with PMT Friday 12/30 - time TBD PMT will continue to follow and support holistically  Goals of Care and Additional Recommendations: Limitations on Scope of Treatment: Full Scope Treatment  Code Status:    Code Status Orders  (From admission, onward)           Start     Ordered   07/18/21 0210  Do not attempt resuscitation (DNR)  Continuous       Question Answer Comment  In the event of cardiac or respiratory ARREST Do not call a code blue   In the event of cardiac or respiratory ARREST Do not perform Intubation, CPR, defibrillation or ACLS   In the event of cardiac or respiratory ARREST Use medication by any route, position, wound care, and other measures to relive pain and suffering. May use oxygen, suction and manual treatment of airway obstruction as needed for comfort.      07/18/21 0209           Code Status History     Date Active Date Inactive Code Status Order ID Comments User Context   07/22/2021 1553 07/18/2021 0209 Full Code [id number]  Luanne Bras, MD Inpatient   07/03/2021 1439 07/07/2021 1553 Full Code [id number]  Greta Doom, MD HOV   07/01/2021 1354 07/01/2021 1439 Full Code [id number]  Greta Doom, MD Inpatient       Prognosis:  Unable to determine   Discharge Planning: To Be Determined  Care plan was discussed with primary RN, patient's family, neurology NP  Thank you for allowing the Palliative Medicine Team to assist in the  care of this patient.   Total Time 120 minutes Prolonged Time Billed  yes       Greater than 50%  of this time was spent counseling and coordinating care related to the above assessment and plan.  Lin Landsman, NP  Please contact Palliative  Medicine Team phone at [phone] for questions and concerns.

## 2021-07-26 DIAGNOSIS — J9601 Acute respiratory failure with hypoxia: Secondary | ICD-10-CM | POA: Diagnosis not present

## 2021-07-26 DIAGNOSIS — I63412 Cerebral infarction due to embolism of left middle cerebral artery: Secondary | ICD-10-CM | POA: Diagnosis not present

## 2021-07-26 DIAGNOSIS — R578 Other shock: Secondary | ICD-10-CM | POA: Diagnosis not present

## 2021-07-26 DIAGNOSIS — I2694 Multiple subsegmental pulmonary emboli without acute cor pulmonale: Secondary | ICD-10-CM | POA: Diagnosis not present

## 2021-07-26 DIAGNOSIS — Z515 Encounter for palliative care: Secondary | ICD-10-CM | POA: Diagnosis not present

## 2021-07-26 DIAGNOSIS — I63512 Cerebral infarction due to unspecified occlusion or stenosis of left middle cerebral artery: Secondary | ICD-10-CM | POA: Diagnosis not present

## 2021-07-26 DIAGNOSIS — I6602 Occlusion and stenosis of left middle cerebral artery: Secondary | ICD-10-CM | POA: Diagnosis not present

## 2021-07-26 DIAGNOSIS — J9602 Acute respiratory failure with hypercapnia: Secondary | ICD-10-CM | POA: Diagnosis not present

## 2021-07-26 LAB — GLUCOSE, CAPILLARY
Glucose-Capillary: 237 mg/dL — ABNORMAL HIGH (ref 70–99)
Glucose-Capillary: 226 mg/dL — ABNORMAL HIGH (ref 70–99)
Glucose-Capillary: 214 mg/dL — ABNORMAL HIGH (ref 70–99)
Glucose-Capillary: 199 mg/dL — ABNORMAL HIGH (ref 70–99)
Glucose-Capillary: 244 mg/dL — ABNORMAL HIGH (ref 70–99)
Glucose-Capillary: 233 mg/dL — ABNORMAL HIGH (ref 70–99)

## 2021-07-26 LAB — BASIC METABOLIC PANEL
Anion gap: 8 (ref 5–15)
BUN: 24 mg/dL — ABNORMAL HIGH (ref 8–23)
CO2: 23 mmol/L (ref 22–32)
Calcium: 8.9 mg/dL (ref 8.9–10.3)
Chloride: 103 mmol/L (ref 98–111)
Creatinine, Ser: 1.17 mg/dL (ref 0.61–1.24)
GFR, Estimated: >60 mL/min (ref >60)
Glucose, Bld: 233 mg/dL — ABNORMAL HIGH (ref 70–99)
Potassium: 4.7 mmol/L (ref 3.5–5.1)
Sodium: 134 mmol/L — ABNORMAL LOW (ref 135–145)

## 2021-07-26 LAB — CBC
HCT: 28 % — ABNORMAL LOW (ref 39.0–52.0)
Hemoglobin: 8.8 g/dL — ABNORMAL LOW (ref 13.0–17.0)
MCH: 29.5 pg (ref 26.0–34.0)
MCHC: 31.4 g/dL (ref 30.0–36.0)
MCV: 94 fL (ref 80.0–100.0)
Platelets: 418 10*3/uL — ABNORMAL HIGH (ref 150–400)
RBC: 2.98 MIL/uL — ABNORMAL LOW (ref 4.22–5.81)
RDW: 18.3 % — ABNORMAL HIGH (ref 11.5–15.5)
WBC: 9 10*3/uL (ref 4.0–10.5)
nRBC: 0 % (ref 0.0–0.2)

## 2021-07-26 LAB — HEPARIN LEVEL (UNFRACTIONATED): Heparin Unfractionated: 0.33 IU/mL (ref 0.30–0.70)

## 2021-07-26 MED ORDER — INSULIN ASPART 100 UNIT/ML IJ SOLN
11.0000 [IU] | INTRAMUSCULAR | Status: DC
  Administered 2021-07-26 – 2021-07-28 (×13): 11 [IU] via SUBCUTANEOUS

## 2021-07-26 MED ORDER — INSULIN ASPART 100 UNIT/ML IJ SOLN: 11.0000 [IU] | INTRAMUSCULAR | Status: DC

## 2021-07-26 MED ORDER — DOCUSATE SODIUM 50 MG/5ML PO LIQD: 100.0000 mg | Freq: Two times a day (BID) | ORAL | Status: DC | PRN

## 2021-07-26 MED ORDER — APIXABAN 5 MG PO TABS
5.0000 mg | ORAL_TABLET | Freq: Two times a day (BID) | ORAL | Status: DC
  Administered 2021-07-26 – 2021-08-01 (×12): 5 mg
  Filled 2021-07-26 (×12): qty 1

## 2021-07-26 MED ORDER — APIXABAN 5 MG PO TABS
5.0000 mg | ORAL_TABLET | Freq: Once | ORAL | Status: AC
  Administered 2021-07-26: 13:00:00 5 mg
  Filled 2021-07-26: qty 1

## 2021-07-26 NOTE — Progress Notes (Signed)
NAME:  Jaime Whitaker, MRN:  [id number], DOB:  24-Jun-1941, LOS: 9 ADMISSION DATE:  08-04-2021, CONSULTATION DATE:  08-04-21 REFERRING MD:  Corliss Skains, CHIEF COMPLAINT:  weakness   History of Present Illness:  80yM on eliquis for ?recently discovered DVT, IDA, PMR, OSA, CVA in 1994. History is obtained through chart review and discussion with the patient's wife. He was planning to undergo EGD for intervention on AVM felt to be potential culprit for IDA today and had held eliquis since 12/18. Today had R sided weakness, aphasia and gaze deviation.  He was also noted to be hypoxic in ED requiring NRB. Found to have L M1 occlusion now s/p aspiration with TICI 3 revascularization. Remained hypoxic after reversal of NMB, left intubated and arrived to neuro ICU on 100 of neo, 50 of propofol, saline infusion at 75 mL/hr  Pertinent  Medical History  DVT IDA PMR OSA CVA  Significant Hospital Events: Including procedures, antibiotic start and stop dates in addition to other pertinent events   08-04-2021 intubated, mechanical thrombectomy L M1 with TICI 3 revascularization 12/22 mechanical thrombectomy c/b 30 min in and out of PEA arrest, systemic TNK for PE. Made DNR after discussion with family. Normothermia protocol initiated 12/24 left femoral central line placed 12/25 GI bleeding but HDS and has known AVM continuing heparin, MR brain with extensive ischemic burden 12/28 weaning on pressure support 5/5 but mental status is poor 12/30 ongoing goals of care discussion.  Family wants to wait 1 more week with hope for recovery.  However no trach/PEG  Interim History / Subjective:   No acute events overnight.  Maintained on the ventilator  Objective   Blood pressure 132/71, pulse 88, temperature (!) 101 F (38.3 C), temperature source Axillary, resp. rate (!) 23, height 5\' 9"  (1.753 m), weight 98.5 kg, SpO2 100 %.    Vent Mode: PSV;CPAP FiO2 (%):  [40 %] 40 % Set Rate:  [16 bmp] 16 bmp Vt Set:   [560 mL] 560 mL PEEP:  [5 cmH20] 5 cmH20 Pressure Support:  [5 cmH20] 5 cmH20 Plateau Pressure:  [18 cmH20-21 cmH20] 18 cmH20   Intake/Output Summary (Last 24 hours) at 07/26/2021 0838 Last data filed at 07/26/2021 0600 Gross per 24 hour  Intake 2770.91 ml  Output 2450 ml  Net 320.91 ml   Filed Weights   07/24/21 0400 07/25/21 0500 07/26/21 0500  Weight: 101.4 kg 101 kg 98.5 kg    Examination: Blood pressure 132/71, pulse 88, temperature (!) 101 F (38.3 C), temperature source Axillary, resp. rate (!) 23, height 5\' 9"  (1.753 m), weight 98.5 kg, SpO2 100 %. Gen:      No acute distress HEENT:  EOMI, sclera anicteric Neck:     No masses; no thyromegaly Lungs:    Clear to auscultation bilaterally; normal respiratory effort CV:         Regular rate and rhythm; no murmurs Abd:      + bowel sounds; soft, non-tender; no palpable masses, no distension Ext:    No edema; adequate peripheral perfusion Skin:      Warm and dry; no rash Neuro: Sedated  Labs/ Imaging reviewed Significant for sodium 134, hemoglobin 8.8, platelets 418 No new imaging  Resolved Hospital Problem list     Assessment & Plan:  Left M1 occlusion s/p thrombectomy, systemic TNKase PEA arrest with anoxic brain injury S/p normothermia protocol Continue supportive care Continue heparin drip  Acute blood loss anemia, possible GI bleed Has known AVM, hemoglobin has remained  stable Monitor CBC while he is on anticoagulation Continue Protonix  Acute hypoxic respiratory failure, bilateral PE, DVT History of OSA Continue vent support, heparin drip Consider transitioning to DOAC  PMR Continue daily prednisone  Non-oliguric AKI vs CKD Follow urine output and creatinine  DM  Increase novolog coverage   Best Practice (right click and "Reselect all SmartList Selections" daily)   Diet/type: tubefeeds DVT prophylaxis: systemic heparin GI prophylaxis: PPI Lines: Central line Foley:  Yes, and it is still  needed Code Status:  full code Last date of multidisciplinary goals of care discussion [Palliative care involved, appreciate assistance. Made DNR after discussion post arrest 12/22.  Per palliative care notes family wants to wait till next week before making decision.]  Critical care time:    The patient is critically ill with multiple organ system failure and requires high complexity decision making for assessment and support, frequent evaluation and titration of therapies, advanced monitoring, review of radiographic studies and interpretation of complex data.   Critical Care Time devoted to patient care services, exclusive of separately billable procedures, described in this note is 35 minutes.   Chilton Greathouse MD [facility] See Amion for pager  If no response to pager , please call [phone] until 7pm After 7:00 pm call Elink  [phone] 07/26/2021, 8:38 AM

## 2021-07-26 NOTE — Progress Notes (Addendum)
ANTICOAGULATION CONSULT NOTE  Pharmacy Consult for Heparin Indication: Acute bilateral PEs and history of DVT  No Known Allergies  Patient Measurements: Height: 5\' 9"  (175.3 cm) Weight: 98.5 kg (217 lb 2.5 oz) IBW/kg (Calculated) : 70.7  Heparin Dosing Weight: 91 kg  Vital Signs: Temp: 101 F (38.3 C) (12/30 0400) Temp Source: Axillary (12/30 0400) BP: 132/71 (12/30 0700) Pulse Rate: 89 (12/30 0700)  Labs: Recent Labs    07/24/21 0556 07/24/21 0953 07/25/21 0655 07/26/21 0451  HGB 8.5* 8.7* 8.8* 8.8*  HCT 26.8* 27.9* 28.2* 28.0*  PLT 289 299 370 418*  HEPARINUNFRC 0.33  --  0.34 0.33  CREATININE 1.09  --  1.06 1.17     Estimated Creatinine Clearance: 60.2 mL/min (by C-G formula based on SCr of 1.17 mg/dL).  Assessment: 32 YOM presented with right-sided weakness and aphagia, underwent revascularization of L MCA.  Also found to have acute bilateral PEs in setting of recent DVT, so Pharmacy consulted to dose IV heparin. He was on Eliquis PTA, but taken off on 12/18 for EGD.   Patient taken to IR 12/21 pm for PE thrombectomy and unable to aspirate significant clot. Patient with loss of pulses and coded in IR. Pt with multiple relative contraindications to lytics but decision made to give tenecteplase 12/22 ~0100.  Heparin resumed 12/22.   Heparin level therapeutic and stable on 1600 units/hr.  Patient has been having dark bowel movements and CCM wanted to keep heparin running for now.  CT without evidence of retroperitoneal hematoma and MRI showed no s/s of bleed.  CBC stable.  Goal of Therapy:  Heparin level 0.3-0.5 units/ml Monitor platelets by anticoagulation protocol: Yes  Plan:  Continue heparin infusion at 1600 units/hr Daily heparin level and CBC   Jaime Whitaker D. 1/23, PharmD, BCPS, BCCCP 07/26/2021, 7:13 AM  ==============================  Addendum: Has been stable on IV heparin Transition to Eliquis 5mg  PT BID, no load per discussion with Neuro (on IV heparin  > 7 days) D/C IV heparin when first dose of Eliquis is administered  Jaime Whitaker D. , PharmD, BCPS, BCCCP 07/26/2021, 12:36 PM

## 2021-07-26 NOTE — Plan of Care (Signed)
°  Problem: Education: Goal: Knowledge of disease or condition will improve Outcome: Not Progressing   Problem: Self-Care: Goal: Ability to participate in self-care as condition permits will improve Outcome: Not Progressing Goal: Ability to communicate needs accurately will improve Outcome: Not Progressing

## 2021-07-26 NOTE — Progress Notes (Addendum)
STROKE TEAM PROGRESS NOTE   INTERVAL HISTORY Patient is seen in his room with his daughter at the bedside.  He has been hemodynamically stable and his neurological exam is unchanged.  His family would like to wait until he has been on the ventilator for 14 days before deciding to transition to comfort care in the hopes that he may show some recovery.  Vitals:   07/26/21 0600 07/26/21 0700 07/26/21 0726 07/26/21 0800  BP: 132/81 132/71    Pulse: 89 89 88   Resp: 20 (!) 21 (!) 23   Temp:    98 F (36.7 C)  TempSrc:    Axillary  SpO2: 100% 100% 100%   Weight:      Height:       CBC:  Recent Labs  Lab 07/25/21 0655 07/26/21 0451  WBC 8.4 9.0  HGB 8.8* 8.8*  HCT 28.2* 28.0*  MCV 94.9 94.0  PLT 370 418*    Basic Metabolic Panel:  Recent Labs  Lab 07/20/21 0405 07/21/21 0611 07/22/21 0526 07/25/21 0655 07/26/21 0451  NA 140 141   < > 136 134*  K 3.4* 3.9   < > 4.7 4.7  CL 110 111   < > 104 103  CO2 21* 23   < > 24 23  GLUCOSE 206* 183*   < > 202* 233*  BUN 32* 23   < > 23 24*  CREATININE 1.55* 1.28*   < > 1.06 1.17  CALCIUM 7.7* 8.1*   < > 9.0 8.9  MG 2.0 2.1  --   --   --   PHOS 1.9* 2.6  --   --   --    < > = values in this interval not displayed.    Lipid Panel:  Recent Labs  Lab 07/25/21 0655  TRIG 137    HgbA1c:  No results for input(s): HGBA1C in the last 168 hours.  Urine Drug Screen: No results for input(s): LABOPIA, COCAINSCRNUR, LABBENZ, AMPHETMU, THCU, LABBARB in the last 168 hours.  Alcohol Level  No results for input(s): ETH in the last 168 hours.   IMAGING past 24 hours No results found.  PHYSICAL EXAM General - Well nourished, well developed, intubated off sedation.  Cardiovascular - Regular rate and rhythm.   Neuro - intubated off sedation, Pupils equal and sluggishly reactive, left corneal present, no right corneal reflex.  No cough with inline suctioning, minimal gag. doll's eyes reflex absent.  No movement to noxious stimuli in any  extremity.  ASSESSMENT/PLAN Mr. LAZARO ISENHOWER is a 80 y.o. male with history of HTN, anemia, sleep apnea, stroke in 1998, HLD, eliquis use for DVT in RLE up until 12/18 presenting with right sided weakness and aphasia. Patient presented with aphasia and right hemiplegia and yesterday underwent emergent mechanical thrombectomy for left M1 occlusion with excellent  TICI 3 revascularization but unfortunately last night developed massive pulmonary embolism with low blood pressure developed bradycardic PEA arrest during pulmonary thrombectomy procedure requiring 30 minutes of CPR before  ROSC.  He has remained unresponsive and on full ventilatory support and vasopressor support. Developed massive PE and went to IR overnight 12/21 for a pulmonary arterial thrombectomy with a subsequent PEA arrest in IR.  He was treated with IV TNKase for systemic thrombolysis.  CCM initiated arctic sun for possible hypoxic brian injury and an overnight EEG. MRI after completion of hypothermia shows extensive involvement of the left cerebral hemisphere in the left MCA territory, left PCA territory  and left watershed territories. Involvement of multiple vascular territories raises the possibility of an embolic process. Superimposed acute hypoxic/ischemic injury cannot be excluded.  Etiology for patient stroke involving pattern, source unclear but likely related to hypercoagulable state given history of DVT and recurrent PE.  However, with extensive stroke and anoxic brain injury on MRI, patient poor neuro exam without sedation, likely poor prognosis. Patient remains on IV heparin infusion and ASA 81. No sedation on board currently.  Stroke: embolic shower but largest at left MCA with L M1 occlusion s/p IR with Left M1 TICI3 but complicated by bilateral PE and PEA arrest s/p  IV TNK and unsuccessful thrombectomy Code Stroke- Hyperdense L MCA, chronic small-vessel ischemic changes of white matter. Old thalamic infarction  CTA head &  neck-Occlusion of L MCA at mid M1. Mild atherosclerotic plaque.  CT perfusion- Left MCA vascular territory totally , no core infarct  MRI  Extensive acute/subacute infarcts within the supratentorial and infratentorial brain, greatest involvement of L MCA, L PCA, and L watershed territory. Embolic with hypoxic/ischemic injury 2D Echo EF 60-65%, mild dilation of ascending aorta, interatrial septum not well visualized Right groin ultrasound done and shows hematoma. LDL 71 HgbA1c 7.4 EEG no seizure activity VTE prophylaxis - SCDs Eliquis (apixaban) daily but off for colonoscopy prior to admission, now on heparin IV.  Therapy recommendations:  pending Disposition:  pending, poor prognosis, repeat family meeting today  B/l PE s/p  IV TNK and unsuccessful thrombectomy PEA arrest during bilateral pulmonary artery thrombectomy Hypotension with bradycardia CPR 60m before ROSC Initiation of Arctic Sun by CCM MRI showed evidence of anoxic brain injury Poor prognosis family meeting for GOC discussion held yesterday, repeat family meeting today at 80 with palliative care  Respiratory failure Intubated for cardiac arrest resuscitation On vent Not candidate for extubation Vent management per CCM  Hypotension Likely due to PEA arrest Was on levophed, now no longer requiring vasopressors Home BP medication- metoprolol 50mg   Hyperlipidemia Home meds:  Lovastatin 20mg , Pravastatin 20 mg ordered in hospital LDL 71, goal < 70 Continue statin at discharge  Diabetes type II uncontrolled HgbA1c 7.4, goal < 7.0 SSI CBG monitoring  Tobacco abuse Current smoker Smoking cessation counseling will be provided  Other Stroke Risk Factors Advanced Age >/= 72  Obesity, Body mass index is 32.07 kg/m., BMI >/= 30 associated with increased stroke risk, recommend weight loss, diet and exercise as appropriate  Hx of stroke in 1998 - no details Hx of DVT on eliquis Obstructive sleep apnea, on CPAP  at home  Other Active Problems Acute blood loss anemia -stabilized- Hb 7.6-> 8.2.  -> 8.1-> 8.7-> 8.8-> 8.8 Pan CT showed no retroperitoneal hematoma.  Continue monitoring. AKI creatinine -resolved- 1.55-> 1.16-> 1.18-> 1.06- >1.09 Leukocytosis WBC -resolved - 11.4-> 7.7-> 6.8-> 8.7 -> 7.9-> 7.0-> 8.4 ->9.0   Hospital day # 9  Patient seen and examined by NP/APP with MD. MD to update note as needed.   76, DNP, FNP-BC [facility] Pager: [phone]  ATTENDING NOTE: I reviewed above note and agree with the assessment and plan. Pt was seen and examined.   Daughter at bedside.  Patient still intubated on ventilation.  Not open eyes on voice, however open eyes with pain stimulation.  Not blinking to visual threat, eyes midline, not tracking not follow commands.  Corneal and gag present.  Breathing over the vent.  With ventilation no significant movement of all extremities.  Again discussed with daughter regarding GOC, she understood  patient has very poor prognosis, but still wants to wait until "14-day" post intubation, which will be next Friday, to make decision.  Palliative care on board  For detailed assessment and plan, please refer to above as I have made changes wherever appropriate.   Marvel Plan, MD PhD Stroke Neurology 07/26/2021 6:53 PM  This patient is critically ill due to respiratory failure, cardiac arrest, bilateral PE, embolic stroke and at significant risk of neurological worsening, death form recurrent cardiac arrest, recurrent stroke, heart failure, pulmonary hypertension, seizure and renal failure. This patient's care requires constant monitoring of vital signs, hemodynamics, respiratory and cardiac monitoring, review of multiple databases, neurological assessment, discussion with family, other specialists and medical decision making of high complexity. I spent 30 minutes of neurocritical care time in the care of this patient. I had discussion with  daughter at bedside, updated pt current condition, treatment plan and potential prognosis, and answered all the questions.  She expressed understanding and appreciation.      To contact Stroke Continuity provider, please refer to [email or web address]. After hours, contact General Neurology

## 2021-07-27 DIAGNOSIS — I63412 Cerebral infarction due to embolism of left middle cerebral artery: Principal | ICD-10-CM

## 2021-07-27 DIAGNOSIS — J9602 Acute respiratory failure with hypercapnia: Secondary | ICD-10-CM | POA: Diagnosis not present

## 2021-07-27 DIAGNOSIS — J9601 Acute respiratory failure with hypoxia: Secondary | ICD-10-CM | POA: Diagnosis not present

## 2021-07-27 LAB — CBC
HCT: 27.6 % — ABNORMAL LOW (ref 39.0–52.0)
Hemoglobin: 8.7 g/dL — ABNORMAL LOW (ref 13.0–17.0)
MCH: 29.7 pg (ref 26.0–34.0)
MCHC: 31.5 g/dL (ref 30.0–36.0)
MCV: 94.2 fL (ref 80.0–100.0)
Platelets: 461 10*3/uL — ABNORMAL HIGH (ref 150–400)
RBC: 2.93 MIL/uL — ABNORMAL LOW (ref 4.22–5.81)
RDW: 17.9 % — ABNORMAL HIGH (ref 11.5–15.5)
WBC: 10.3 10*3/uL (ref 4.0–10.5)
nRBC: 0.2 % (ref 0.0–0.2)

## 2021-07-27 LAB — BASIC METABOLIC PANEL
Anion gap: 10 (ref 5–15)
BUN: 27 mg/dL — ABNORMAL HIGH (ref 8–23)
CO2: 23 mmol/L (ref 22–32)
Calcium: 9.1 mg/dL (ref 8.9–10.3)
Chloride: 101 mmol/L (ref 98–111)
Creatinine, Ser: 0.99 mg/dL (ref 0.61–1.24)
GFR, Estimated: >60 mL/min (ref >60)
Glucose, Bld: 192 mg/dL — ABNORMAL HIGH (ref 70–99)
Potassium: 4.5 mmol/L (ref 3.5–5.1)
Sodium: 134 mmol/L — ABNORMAL LOW (ref 135–145)

## 2021-07-27 NOTE — Plan of Care (Signed)
°  Problem: Education: °Goal: Knowledge of disease or condition will improve °Outcome: Not Progressing °Goal: Knowledge of secondary prevention will improve (SELECT ALL) °Outcome: Not Progressing °Goal: Knowledge of patient specific risk factors will improve (INDIVIDUALIZE FOR PATIENT) °Outcome: Not Progressing °  °

## 2021-07-27 NOTE — Progress Notes (Signed)
ETT holder changed with assistance from RN. No complications noted.

## 2021-07-27 NOTE — Progress Notes (Addendum)
STROKE TEAM PROGRESS NOTE   INTERVAL HISTORY Patient is seen in his room with his daughter at the bedside.  He has been hemodynamically stable and his neurological exam grossly is unchanged.  Fluctuation in eye-opening to noxious stimuli.  His family would like to wait until he has been on the ventilator for 14 days before deciding to transition to comfort care in the hopes that he may show some recovery.  Vitals:   07/27/21 1100 07/27/21 1101 07/27/21 1200 07/27/21 1300  BP:  (!) 147/68 (!) 148/73 (!) 142/77  Pulse: (!) 103 (!) 101 (!) 109 (!) 110  Resp: (!) 25 (!) 23 (!) 28 (!) 25  Temp:   99.1 F (37.3 C)   TempSrc:   Oral   SpO2: 100% 100%  100%  Weight:      Height:       CBC:  Recent Labs  Lab 07/26/21 0451 07/27/21 0328  WBC 9.0 10.3  HGB 8.8* 8.7*  HCT 28.0* 27.6*  MCV 94.0 94.2  PLT 418* 461*    Basic Metabolic Panel:  Recent Labs  Lab 07/21/21 0611 07/22/21 0526 07/26/21 0451 07/27/21 0328  NA 141   < > 134* 134*  K 3.9   < > 4.7 4.5  CL 111   < > 103 101  CO2 23   < > 23 23  GLUCOSE 183*   < > 233* 192*  BUN 23   < > 24* 27*  CREATININE 1.28*   < > 1.17 0.99  CALCIUM 8.1*   < > 8.9 9.1  MG 2.1  --   --   --   PHOS 2.6  --   --   --    < > = values in this interval not displayed.    Lipid Panel:  Recent Labs  Lab 07/25/21 0655  TRIG 137    HgbA1c:  No results for input(s): HGBA1C in the last 168 hours.  Urine Drug Screen: No results for input(s): LABOPIA, COCAINSCRNUR, LABBENZ, AMPHETMU, THCU, LABBARB in the last 168 hours.  Alcohol Level  No results for input(s): ETH in the last 168 hours.   IMAGING past 24 hours No results found.  PHYSICAL EXAM General - Well nourished, well developed, intubated off sedation.  Cardiovascular - Regular rate and rhythm.   Neuro - intubated off sedation, Pupils equal and sluggishly reactive, left corneal present, no right corneal reflex.  No cough with inline suctioning, minimal gag. doll's eyes reflex  absent.  No movement to noxious stimuli in any extremity.  ASSESSMENT/PLAN Jaime Whitaker is a 80 y.o. male with history of HTN, anemia, sleep apnea, stroke in 1998, HLD, eliquis use for DVT in RLE up until 12/18 presenting with right sided weakness and aphasia. Patient presented with aphasia and right hemiplegia and yesterday underwent emergent mechanical thrombectomy for left M1 occlusion with excellent  TICI 3 revascularization but unfortunately last night developed massive pulmonary embolism with low blood pressure developed bradycardic PEA arrest during pulmonary thrombectomy procedure requiring 30 minutes of CPR before  ROSC.  He has remained unresponsive and on full ventilatory support and vasopressor support. Developed massive PE and went to IR overnight 12/21 for a pulmonary arterial thrombectomy with a subsequent PEA arrest in IR.  He was treated with IV TNKase for systemic thrombolysis.  CCM initiated arctic sun for possible hypoxic brian injury and an overnight EEG. MRI after completion of hypothermia shows extensive involvement of the left cerebral hemisphere in the left  MCA territory, left PCA territory and left watershed territories. Involvement of multiple vascular territories raises the possibility of an embolic process. Superimposed acute hypoxic/ischemic injury cannot be excluded.  Etiology for patient stroke involving pattern, source unclear but likely related to hypercoagulable state given history of DVT and recurrent PE.  However, with extensive stroke and anoxic brain injury on MRI, patient poor neuro exam without sedation, likely poor prognosis. Patient remains on IV heparin infusion and ASA 81. No sedation on board currently.  Stroke: embolic shower but largest at left MCA with L M1 occlusion s/p IR with Left M1 TICI3 but complicated by bilateral PE and PEA arrest s/p  IV TNK and unsuccessful thrombectomy Code Stroke- Hyperdense L MCA, chronic small-vessel ischemic changes of white  matter. Old thalamic infarction  CTA head & neck-Occlusion of L MCA at mid M1. Mild atherosclerotic plaque.  CT perfusion- Left MCA vascular territory totally , no core infarct  MRI  Extensive acute/subacute infarcts within the supratentorial and infratentorial brain, greatest involvement of L MCA, L PCA, and L watershed territory. Embolic with hypoxic/ischemic injury 2D Echo EF 60-65%, mild dilation of ascending aorta, interatrial septum not well visualized Right groin ultrasound done and shows hematoma. LDL 71 HgbA1c 7.4 EEG no seizure activity VTE prophylaxis - SCDs Eliquis (apixaban) daily but off for colonoscopy prior to admission, now on heparin IV.  Therapy recommendations:  pending Disposition:  pending, poor prognosis, repeat family meeting today  B/l PE s/p  IV TNK and unsuccessful thrombectomy PEA arrest during bilateral pulmonary artery thrombectomy Hypotension with bradycardia CPR 72m before ROSC Initiation of Arctic Sun by CCM MRI showed evidence of anoxic brain injury Poor prognosis family meeting for GOC discussion held yesterday, repeat family meeting today at 64 with palliative care  Respiratory failure Intubated for cardiac arrest resuscitation On vent Not candidate for extubation Vent management per CCM  Hypotension Likely due to PEA arrest Was on levophed, now no longer requiring vasopressors Home BP medication- metoprolol 50mg   Hyperlipidemia Home meds:  Lovastatin 20mg , Pravastatin 20 mg ordered in hospital LDL 71, goal < 70 Continue statin at discharge  Diabetes type II uncontrolled HgbA1c 7.4, goal < 7.0 SSI CBG monitoring  Tobacco abuse Current smoker Smoking cessation counseling will be provided  Other Stroke Risk Factors Advanced Age >/= 47  Obesity, Body mass index is 33.27 kg/m., BMI >/= 30 associated with increased stroke risk, recommend weight loss, diet and exercise as appropriate  Hx of stroke in 1998 - no details Hx of DVT  on eliquis Obstructive sleep apnea, on CPAP at home  Other Active Problems Acute blood loss anemia -stabilized- Hb 7.6-> 8.2.  -> 8.1-> 8.7-> 8.8-> 8.8-> 8.7 Pan CT showed no retroperitoneal hematoma.  Continue monitoring. AKI creatinine -resolved- 1.55-> 1.16-> 1.18-> 1.06- >1.09->0.99 Leukocytosis WBC -resolved - 11.4-> 7.7-> 6.8-> 8.7 -> 7.9-> 7.0-> 8.4 ->9.0->10.3   Hospital day # 10  Patient seen and examined by NP/APP with MD. MD to update note as needed.   76, DNP, FNP-BC [facility] Pager: [phone]  ATTENDING ATTESTATION:  Agree with above note. No change in exam. Family waiting on making a decision.  Dr. Elmer Picker evaluated pt independently, reviewed imaging, chart, labs. Discussed and formulated plan with the APP. Please see APP note above for details.     This patient is critically ill due to respiratory distress, cardiac arrest, bilateral PE, embolic stroke and at significant risk of neurological worsening, death form heart failure, respiratory failure, recurrent stroke, bleeding  from [facility], seizure, sepsis. This patient's care requires constant monitoring of vital signs, hemodynamics, respiratory and cardiac monitoring, review of multiple databases, neurological assessment, discussion with family, other specialists and medical decision making of high complexity. I spent 35 minutes of neurocritical care time in the care of this patient.  Discussed case with Dr. Isaiah Serge.  Answered daughter's questions.   Jaime Solt,MD     To contact Stroke Continuity provider, please refer to [email or web address]. After hours, contact General Neurology

## 2021-07-27 NOTE — Progress Notes (Signed)
Patient ID: Jaime Whitaker, male   DOB: 1941-02-22, 80 y.o.   MRN: [id number]        Brief Palliative Progress Note:  I spoke with daughter/Jaime Whitaker today by phone to arrange family meeting for later today. Jaime Whitaker tells me she doesn't think there is a need to meet again today. She states the family is going to wait to make any decisions until patient has been on the ventilator for 14 days. She expresses frustration that the medical providers are "not giving them any hope" regarding patient's current medical situation. I gently let her know that the medical team is simply letting her know what they are seeing on imaging and clinical exam, which has been consistent with poor prognosis. She tells me that they have several family members who have "been in the same situation" and subsequently recovered. She reports feeling encouraged that patient has been opening his eyes and feels that he is able to recognize family members.  Jaime Whitaker states that the next family meeting was planned for next Friday 2021-08-07, but I let her know that is too long and we will need to meet before then. Jaime Whitaker is agreeable to having PMT call her Monday 07/29/21 in order to arrange a family meeting early next week.   PMT later received a call from patient's son/Jaime Whitaker to confirm the meeting for today at 6:30 pm. I attempted to call Jaime Whitaker back several times with no answer and was unable to leave a voicemail due to the mailbox was full.   PMT will continue to follow.     Sherlean Foot, NP-C Palliative Medicine   Please call Palliative Medicine team phone with any questions [phone]. For individual providers please see AMION.   Total time: 20 minutes

## 2021-07-27 NOTE — Progress Notes (Signed)
NAME:  JULIE PAOLINI, MRN:  [id number], DOB:  1940/11/28, LOS: 10 ADMISSION DATE:  07/23/2021, CONSULTATION DATE:  07/27/2021 REFERRING MD:  Corliss Skains, CHIEF COMPLAINT:  weakness   History of Present Illness:  80yM on eliquis for ?recently discovered DVT, IDA, PMR, OSA, CVA in 1994. History is obtained through chart review and discussion with the patient's wife. He was planning to undergo EGD for intervention on AVM felt to be potential culprit for IDA today and had held eliquis since 12/18. Today had R sided weakness, aphasia and gaze deviation.  He was also noted to be hypoxic in ED requiring NRB. Found to have L M1 occlusion now s/p aspiration with TICI 3 revascularization. Remained hypoxic after reversal of NMB, left intubated and arrived to neuro ICU on 100 of neo, 50 of propofol, saline infusion at 75 mL/hr  Pertinent  Medical History  DVT IDA PMR OSA CVA  Significant Hospital Events: Including procedures, antibiotic start and stop dates in addition to other pertinent events   07/12/2021 intubated, mechanical thrombectomy L M1 with TICI 3 revascularization 12/22 mechanical thrombectomy c/b 30 min in and out of PEA arrest, systemic TNK for PE. Made DNR after discussion with family. Normothermia protocol initiated 12/24 left femoral central line placed 12/25 GI bleeding but HDS and has known AVM continuing heparin, MR brain with extensive ischemic burden 12/28 weaning on pressure support 5/5 but mental status is poor 12/30 ongoing goals of care discussion.  Family wants to wait 1 more week with hope for recovery.  However no trach/PEG  Interim History / Subjective:   Maintained on the ventilator.  No acute event  Objective   Blood pressure 128/68, pulse 82, temperature 99.1 F (37.3 C), temperature source Oral, resp. rate 17, height 5\' 9"  (1.753 m), weight 102.2 kg, SpO2 100 %.    Vent Mode: PRVC FiO2 (%):  [40 %] 40 % Set Rate:  [16 bmp] 16 bmp Vt Set:  [560 mL] 560 mL PEEP:   [5 cmH20] 5 cmH20 Pressure Support:  [5 cmH20] 5 cmH20 Plateau Pressure:  [20 cmH20-21 cmH20] 20 cmH20   Intake/Output Summary (Last 24 hours) at 07/27/2021 07/29/2021 Last data filed at 07/27/2021 0600 Gross per 24 hour  Intake 2395.57 ml  Output 1900 ml  Net 495.57 ml   Filed Weights   07/25/21 0500 07/26/21 0500 07/27/21 0500  Weight: 101 kg 98.5 kg 102.2 kg    Examination: Gen:      No acute distress HEENT:  EOMI, sclera anicteric Neck:     No masses; no thyromegaly, ETT Lungs:    Clear to auscultation bilaterally; normal respiratory effort CV:         Regular rate and rhythm; no murmurs Abd:      + bowel sounds; soft, non-tender; no palpable masses, no distension Ext:    No edema; adequate peripheral perfusion Skin:      Warm and dry; no rash Neuro: Sedated  Labs/ Imaging reviewed Labs are stable, no new imaging  Resolved Hospital Problem list     Assessment & Plan:  Left M1 occlusion s/p thrombectomy, systemic TNKase PEA arrest with anoxic brain injury S/p normothermia protocol Continue supportive care Continue heparin drip  Acute blood loss anemia, possible GI bleed Has known AVM, hemoglobin has remained stable Monitor CBC while he is on anticoagulation Continue Protonix  Acute hypoxic respiratory failure, bilateral PE, DVT History of OSA Continue vent support Heparin transitioned to Eliquis  PMR Continue daily prednisone  Non-oliguric AKI  vs CKD Follow urine output and creatinine  DM  Increase novolog coverage   Best Practice (right click and "Reselect all SmartList Selections" daily)   Diet/type: tubefeeds DVT prophylaxis: DOAC GI prophylaxis: PPI Lines: N/A Foley:  N/A Code Status:  DNR Last date of multidisciplinary goals of care discussion [Palliative care involved, appreciate assistance. Made DNR after discussion post arrest 12/22.  Per palliative care notes family wants to wait till next week before making decision.]  Critical care time: NA     Chilton Greathouse MD [facility] See Amion for pager  If no response to pager , please call [phone] until 7pm After 7:00 pm call Elink  [phone] 07/27/2021, 7:32 AM

## 2021-07-28 DIAGNOSIS — J9601 Acute respiratory failure with hypoxia: Secondary | ICD-10-CM | POA: Diagnosis not present

## 2021-07-28 DIAGNOSIS — J9602 Acute respiratory failure with hypercapnia: Secondary | ICD-10-CM | POA: Diagnosis not present

## 2021-07-28 LAB — BASIC METABOLIC PANEL WITH GFR
Anion gap: 7 (ref 5–15)
BUN: 28 mg/dL — ABNORMAL HIGH (ref 8–23)
CO2: 23 mmol/L (ref 22–32)
Calcium: 8.9 mg/dL (ref 8.9–10.3)
Chloride: 104 mmol/L (ref 98–111)
Creatinine, Ser: 1.06 mg/dL (ref 0.61–1.24)
GFR, Estimated: >60 mL/min (ref >60)
Glucose, Bld: 221 mg/dL — ABNORMAL HIGH (ref 70–99)
Potassium: 4.6 mmol/L (ref 3.5–5.1)
Sodium: 134 mmol/L — ABNORMAL LOW (ref 135–145)

## 2021-07-28 LAB — TRIGLYCERIDES: Triglycerides: 95 mg/dL (ref <150)

## 2021-07-28 MED ORDER — CHLORHEXIDINE GLUCONATE CLOTH 2 % EX PADS
6.0000 | MEDICATED_PAD | Freq: Every day | CUTANEOUS | Status: DC
  Administered 2021-07-28 – 2021-08-01 (×6): 6 via TOPICAL

## 2021-07-28 MED ORDER — INSULIN ASPART 100 UNIT/ML IJ SOLN
14.0000 [IU] | INTRAMUSCULAR | Status: DC
  Administered 2021-07-28 – 2021-07-31 (×20): 14 [IU] via SUBCUTANEOUS

## 2021-07-28 NOTE — Progress Notes (Addendum)
STROKE TEAM PROGRESS NOTE   INTERVAL HISTORY Patient is seen in his room , no family at the bedside.  He has been hemodynamically stable and his neurological exam grossly is unchanged.  Fluctuation in eye-opening to noxious stimuli.  His family would like to wait until he has been on the ventilator for 14 days before deciding to transition to comfort care in the hopes that he may show some recovery. Tentative plan for family meeting on Wednesday.   Vitals:   07/28/21 0600 07/28/21 0700 07/28/21 0721 07/28/21 0800  BP: 119/75 119/74  130/74  Pulse: 93 94 95 100  Resp: 19 20 (!) 25 (!) 24  Temp:    98.9 F (37.2 C)  TempSrc:    Axillary  SpO2: 100% 100% 100% 100%  Weight:      Height:       CBC:  Recent Labs  Lab 07/26/21 0451 07/27/21 0328  WBC 9.0 10.3  HGB 8.8* 8.7*  HCT 28.0* 27.6*  MCV 94.0 94.2  PLT 418* 461*    Basic Metabolic Panel:  Recent Labs  Lab 07/27/21 0328 07/28/21 0433  NA 134* 134*  K 4.5 4.6  CL 101 104  CO2 23 23  GLUCOSE 192* 221*  BUN 27* 28*  CREATININE 0.99 1.06  CALCIUM 9.1 8.9    Lipid Panel:  Recent Labs  Lab 07/28/21 0433  TRIG 95    HgbA1c:  No results for input(s): HGBA1C in the last 168 hours.  Urine Drug Screen: No results for input(s): LABOPIA, COCAINSCRNUR, LABBENZ, AMPHETMU, THCU, LABBARB in the last 168 hours.  Alcohol Level  No results for input(s): ETH in the last 168 hours.   IMAGING past 24 hours No results found.  PHYSICAL EXAM General - Well nourished, well developed, intubated off sedation.  Cardiovascular - Regular rate and rhythm.   Neuro - intubated off sedation, Pupils equal and sluggishly reactive. Bilateral corneal reflex present.  Minimal cough with inline suctioning, minimal gag. doll's eyes reflex present.  Spontaneous movement of right lower extremity.    ASSESSMENT/PLAN Mr. HILMAN CATAPANO is a 81 y.o. male with history of HTN, anemia, sleep apnea, stroke in 1998, HLD, eliquis use for DVT in RLE  up until 12/18 presenting with right sided weakness and aphasia. Patient presented with aphasia and right hemiplegia and yesterday underwent emergent mechanical thrombectomy for left M1 occlusion with excellent  TICI 3 revascularization but unfortunately last night developed massive pulmonary embolism with low blood pressure developed bradycardic PEA arrest during pulmonary thrombectomy procedure requiring 30 minutes of CPR before  ROSC.  He has remained unresponsive and on full ventilatory support and vasopressor support. Developed massive PE and went to IR overnight 12/21 for a pulmonary arterial thrombectomy with a subsequent PEA arrest in IR.  He was treated with IV TNKase for systemic thrombolysis.  CCM initiated arctic sun for possible hypoxic brian injury and an overnight EEG. MRI after completion of hypothermia shows extensive involvement of the left cerebral hemisphere in the left MCA territory, left PCA territory and left watershed territories. Involvement of multiple vascular territories raises the possibility of an embolic process. Superimposed acute hypoxic/ischemic injury cannot be excluded.  Etiology for patient stroke involving pattern, source unclear but likely related to hypercoagulable state given history of DVT and recurrent PE.  However, with extensive stroke and anoxic brain injury on MRI, patient poor neuro exam without sedation, likely poor prognosis. Patient remains on IV heparin infusion and ASA 81. No sedation on board  currently.  Stroke: embolic shower but largest at left MCA with L M1 occlusion s/p IR with Left M1 TICI3 but complicated by bilateral PE and PEA arrest s/p  IV TNK and unsuccessful thrombectomy Code Stroke- Hyperdense L MCA, chronic small-vessel ischemic changes of white matter. Old thalamic infarction  CTA head & neck-Occlusion of L MCA at mid M1. Mild atherosclerotic plaque.  CT perfusion- Left MCA vascular territory totally 163ml, no core infarct  MRI  Extensive  acute/subacute infarcts within the supratentorial and infratentorial brain, greatest involvement of L MCA, L PCA, and L watershed territory. Embolic with hypoxic/ischemic injury 2D Echo EF 60-65%, mild dilation of ascending aorta, interatrial septum not well visualized Right groin ultrasound done and shows hematoma. LDL 71 HgbA1c 7.4 EEG no seizure activity VTE prophylaxis - SCDs Eliquis (apixaban) daily but off for colonoscopy prior to admission, now on heparin IV.  Therapy recommendations:  pending Disposition:  pending, poor prognosis, repeat family meeting today  B/l PE s/p  IV TNK and unsuccessful thrombectomy PEA arrest during bilateral pulmonary artery thrombectomy Hypotension with bradycardia CPR 65m before ROSC Initiation of Arctic Sun by CCM MRI showed evidence of anoxic brain injury Poor prognosis family meeting for Geiger discussion held yesterday, repeat family meeting today at 26 with palliative care  Respiratory failure Intubated for cardiac arrest resuscitation On vent Not candidate for extubation Vent management per CCM  Hypotension Likely due to PEA arrest Was on levophed, now no longer requiring vasopressors Home BP medication- metoprolol 50mg   Hyperlipidemia Home meds:  Lovastatin 20mg , Pravastatin 20 mg ordered in hospital LDL 71, goal < 70 Continue statin at discharge  Diabetes type II uncontrolled HgbA1c 7.4, goal < 7.0 SSI CBG monitoring  Tobacco abuse Current smoker Smoking cessation counseling will be provided  Other Stroke Risk Factors Advanced Age >/= 67  Obesity, Body mass index is 32.98 kg/m., BMI >/= 30 associated with increased stroke risk, recommend weight loss, diet and exercise as appropriate  Hx of stroke in 1998 - no details Hx of DVT on eliquis Obstructive sleep apnea, on CPAP at home  Other Active Problems Acute blood loss anemia -stabilized- Hb 7.6-> 8.2.  -> 8.1-> 8.7-> 8.8-> 8.8-> 8.7 Pan CT showed no retroperitoneal  hematoma.  Continue monitoring. AKI creatinine -resolved- 1.55-> 1.16-> 1.18-> 1.06- >1.09->0.99->1.06 Leukocytosis WBC -resolved - 11.4-> 7.7-> 6.8-> 8.7 -> 7.9-> 7.0-> 8.4 ->9.0->10.3   Hospital day # 11  Patient seen and examined by NP/APP with MD. MD to update note as needed.   Janine Ores, DNP, FNP-BC [facility] Pager: [phone]  ATTENDING ATTESTATION:  Agree with above note.  Dr. Reeves Forth evaluated pt independently, reviewed imaging, chart, labs. Discussed and formulated plan with the APP. Please see APP note above for details.       This patient is critically ill due to respiratory distress, cardiac arrest, bilateral PE, embolic stroke ,and at significant risk of neurological worsening, death form heart failure, respiratory failure, recurrent stroke, bleeding from [facility], seizure, sepsis. This patient's care requires constant monitoring of vital signs, hemodynamics, respiratory and cardiac monitoring, review of multiple databases, neurological assessment, discussion with family, other specialists and medical decision making of high complexity. I spent 35 minutes of neurocritical care time in the care of this patient.  Discussed case with Dr. Vaughan Browner, [place].  Sunny Gains,MD    To contact Stroke Continuity provider, please refer to [email or web address] After hours, contact General Neurology

## 2021-07-28 NOTE — Progress Notes (Signed)
NAME:  Jaime Whitaker, MRN:  [id number], DOB:  23-Jan-1941, LOS: 11 ADMISSION DATE:  07/02/2021, CONSULTATION DATE:  07/15/2021 REFERRING MD:  Corliss Skains, CHIEF COMPLAINT:  weakness   History of Present Illness:  80yM on eliquis for ?recently discovered DVT, IDA, PMR, OSA, CVA in 1994. History is obtained through chart review and discussion with the patient's wife. He was planning to undergo EGD for intervention on AVM felt to be potential culprit for IDA today and had held eliquis since 12/18. Today had R sided weakness, aphasia and gaze deviation.  He was also noted to be hypoxic in ED requiring NRB. Found to have L M1 occlusion now s/p aspiration with TICI 3 revascularization. Remained hypoxic after reversal of NMB, left intubated and arrived to neuro ICU on 100 of neo, 50 of propofol, saline infusion at 75 mL/hr  Pertinent  Medical History  DVT, IDA, PMR, OSA, CVA  Significant Hospital Events: Including procedures, antibiotic start and stop dates in addition to other pertinent events   06/27/2021 intubated, mechanical thrombectomy L M1 with TICI 3 revascularization 12/22 mechanical thrombectomy c/b 30 min in and out of PEA arrest, systemic TNK for PE. Made DNR after discussion with family. Normothermia protocol initiated 12/24 left femoral central line placed 12/25 GI bleeding but HDS and has known AVM continuing heparin, MR brain with extensive ischemic burden 12/28 weaning on pressure support 5/5 but mental status is poor 12/30 ongoing goals of care discussion.  Family wants to wait 1 more week with hope for recovery.  However no trach/PEG  Interim History / Subjective:   Weaning on pressure support.  Mental status remains poor  Objective   Blood pressure 130/74, pulse 100, temperature 98.9 F (37.2 C), temperature source Axillary, resp. rate (!) 24, height 5\' 9"  (1.753 m), weight 101.3 kg, SpO2 100 %.    Vent Mode: PSV;CPAP FiO2 (%):  [30 %] 30 % Set Rate:  [16 bmp] 16 bmp Vt Set:   [560 mL] 560 mL PEEP:  [5 cmH20] 5 cmH20 Pressure Support:  [5 cmH20] 5 cmH20 Plateau Pressure:  [18 cmH20] 18 cmH20   Intake/Output Summary (Last 24 hours) at 07/28/2021 0830 Last data filed at 07/28/2021 0800 Gross per 24 hour  Intake 2638.02 ml  Output 2150 ml  Net 488.02 ml   Filed Weights   07/26/21 0500 07/27/21 0500 07/28/21 0307  Weight: 98.5 kg 102.2 kg 101.3 kg    Examination: Gen:      No acute distress HEENT:  EOMI, sclera anicteric Neck:     No masses; no thyromegaly, ETT Lungs:    Clear to auscultation bilaterally; normal respiratory effort CV:         Regular rate and rhythm; no murmurs Abd:      + bowel sounds; soft, non-tender; no palpable masses, no distension Ext:    No edema; adequate peripheral perfusion Skin:      Warm and dry; no rash Neuro: Unresponsive  Labs/ Imaging reviewed Labs are stable, no new imaging  Resolved Hospital Problem list   Non-oliguric AKI vs CKD  Assessment & Plan:  Left M1 occlusion s/p thrombectomy, systemic TNKase PEA arrest with anoxic brain injury S/p normothermia protocol Continue supportive care Continue heparin drip  Acute blood loss anemia, possible GI bleed Has known AVM, hemoglobin has remained stable Monitor CBC while he is on anticoagulation Continue Protonix  Acute hypoxic respiratory failure, bilateral PE, DVT History of OSA Continue vent support Heparin transitioned to Eliquis  PMR Continue daily  prednisone  DM  Novolog coverage increased  Best Practice (right click and "Reselect all SmartList Selections" daily)   Diet/type: tubefeeds DVT prophylaxis: DOAC GI prophylaxis: PPI Lines: N/A Foley:  N/A Code Status:  DNR Last date of multidisciplinary goals of care discussion [Palliative care involved, appreciate assistance. Made DNR after discussion post arrest 12/22.  Per palliative care notes family wants to wait till next week before making decision.]  Critical care time: NA   Chilton Greathouse  MD [facility] See Amion for pager  If no response to pager , please call [phone] until 7pm After 7:00 pm call Elink  [phone] 07/28/2021, 8:33 AM

## 2021-07-28 DEATH — deceased

## 2021-07-29 DIAGNOSIS — J9601 Acute respiratory failure with hypoxia: Secondary | ICD-10-CM | POA: Diagnosis not present

## 2021-07-29 DIAGNOSIS — I6300 Cerebral infarction due to thrombosis of unspecified precerebral artery: Secondary | ICD-10-CM

## 2021-07-29 DIAGNOSIS — J9602 Acute respiratory failure with hypercapnia: Secondary | ICD-10-CM | POA: Diagnosis not present

## 2021-07-29 LAB — BASIC METABOLIC PANEL
Anion gap: 7 (ref 5–15)
BUN: 28 mg/dL — ABNORMAL HIGH (ref 8–23)
CO2: 24 mmol/L (ref 22–32)
Calcium: 9.2 mg/dL (ref 8.9–10.3)
Chloride: 103 mmol/L (ref 98–111)
Creatinine, Ser: 1.04 mg/dL (ref 0.61–1.24)
GFR, Estimated: >60 mL/min (ref >60)
Glucose, Bld: 168 mg/dL — ABNORMAL HIGH (ref 70–99)
Potassium: 4.6 mmol/L (ref 3.5–5.1)
Sodium: 134 mmol/L — ABNORMAL LOW (ref 135–145)

## 2021-07-29 MED ORDER — INSULIN ASPART 100 UNIT/ML IJ SOLN
0.0000 [IU] | INTRAMUSCULAR | Status: DC
  Administered 2021-07-29 (×2): 4 [IU] via SUBCUTANEOUS
  Administered 2021-07-29 – 2021-07-30 (×3): 7 [IU] via SUBCUTANEOUS
  Administered 2021-07-30: 4 [IU] via SUBCUTANEOUS
  Administered 2021-07-30: 7 [IU] via SUBCUTANEOUS
  Administered 2021-07-30 – 2021-07-31 (×3): 4 [IU] via SUBCUTANEOUS
  Administered 2021-07-31 (×2): 7 [IU] via SUBCUTANEOUS
  Administered 2021-07-31: 4 [IU] via SUBCUTANEOUS
  Administered 2021-07-31: 7 [IU] via SUBCUTANEOUS

## 2021-07-29 MED ORDER — INSULIN ASPART 100 UNIT/ML IJ SOLN
0.0000 [IU] | INTRAMUSCULAR | Status: DC
  Administered 2021-07-29: 3 [IU] via SUBCUTANEOUS

## 2021-07-29 MED ORDER — PANTOPRAZOLE 2 MG/ML SUSPENSION
40.0000 mg | Freq: Two times a day (BID) | ORAL | Status: DC
  Administered 2021-07-29 – 2021-07-31 (×5): 40 mg
  Filled 2021-07-29 (×5): qty 20

## 2021-07-29 NOTE — Progress Notes (Addendum)
STROKE TEAM PROGRESS NOTE   INTERVAL HISTORY Patient is seen in his room with no family at the bedside.  He has been hemodynamically stable and his neurological exam grossly is unchanged.  He remains on full ventilatory support for respiratory failure.  Family meeting planned for this Wednesday 07/31/21  Vitals:   07/29/21 0719 07/29/21 0800 07/29/21 1100 07/29/21 1200  BP:  138/81    Pulse: (!) 115 (!) 116    Resp: (!) 25 (!) 25    Temp:  (!) 100.9 F (38.3 C)  99.9 F (37.7 C)  TempSrc:  Axillary  Axillary  SpO2: 100% 100% 100%   Weight:      Height:       CBC:  Recent Labs  Lab 07/26/21 0451 07/27/21 0328  WBC 9.0 10.3  HGB 8.8* 8.7*  HCT 28.0* 27.6*  MCV 94.0 94.2  PLT 418* 461*    Basic Metabolic Panel:  Recent Labs  Lab 07/28/21 0433 07/29/21 0544  NA 134* 134*  K 4.6 4.6  CL 104 103  CO2 23 24  GLUCOSE 221* 168*  BUN 28* 28*  CREATININE 1.06 1.04  CALCIUM 8.9 9.2    Lipid Panel:  Recent Labs  Lab 07/28/21 0433  TRIG 95    HgbA1c:  No results for input(s): HGBA1C in the last 168 hours.  Urine Drug Screen: No results for input(s): LABOPIA, COCAINSCRNUR, LABBENZ, AMPHETMU, THCU, LABBARB in the last 168 hours.  Alcohol Level  No results for input(s): ETH in the last 168 hours.   IMAGING past 24 hours No results found.  PHYSICAL EXAM General - Well nourished, well developed, elderly male intubated off sedation.  Cardiovascular - Regular rate and rhythm.   Neuro - intubated off sedation, unresponsive.  Eyes are closed.  Pupils equal and nonreactive. Bilateral corneal reflex present.  Minimal cough with inline suctioning, minimal gag.  No spontaneous extremity movements.  Flickers BLE to noxious stimuli Plantars both mute. ASSESSMENT/PLAN Jaime Whitaker is a 81 y.o. male with history of HTN, anemia, sleep apnea, stroke in 1998, HLD, eliquis use for DVT in RLE up until 12/18 presenting with right sided weakness and aphasia. Patient presented  with aphasia and right hemiplegia and yesterday underwent emergent mechanical thrombectomy for left M1 occlusion with excellent  TICI 3 revascularization but unfortunately last night developed massive pulmonary embolism with low blood pressure developed bradycardic PEA arrest during pulmonary thrombectomy procedure requiring 30 minutes of CPR before  ROSC.  He has remained unresponsive and on full ventilatory support and vasopressor support. Developed massive PE and went to IR overnight 12/21 for a pulmonary arterial thrombectomy with a subsequent PEA arrest in IR.  He was treated with IV TNKase for systemic thrombolysis.  CCM initiated arctic sun for possible hypoxic brian injury and an overnight EEG. MRI after completion of hypothermia shows extensive involvement of the left cerebral hemisphere in the left MCA territory, left PCA territory and left watershed territories. Involvement of multiple vascular territories raises the possibility of an embolic process. Superimposed acute hypoxic/ischemic injury cannot be excluded.  Etiology for patient stroke involving pattern, source unclear but likely related to hypercoagulable state given history of DVT and recurrent PE.  However, with extensive stroke and anoxic brain injury on MRI, patient poor neuro exam without sedation, likely poor prognosis. Patient remains on Eliquis and ASA 81. No sedation on board currently.  Stroke: embolic shower but largest at left MCA with L M1 occlusion s/p IR with Left M1  TICI3 but complicated by bilateral PE and PEA arrest s/p  IV TNK and unsuccessful thrombectomy Code Stroke- Hyperdense L MCA, chronic small-vessel ischemic changes of white matter. Old thalamic infarction  CTA head & neck-Occlusion of L MCA at mid M1. Mild atherosclerotic plaque.  CT perfusion- Left MCA vascular territory totally 118ml, no core infarct  MRI  Extensive acute/subacute infarcts within the supratentorial and infratentorial brain, greatest involvement  of L MCA, L PCA, and L watershed territory. Embolic with hypoxic/ischemic injury 2D Echo EF 60-65%, mild dilation of ascending aorta, interatrial septum not well visualized Right groin ultrasound done and shows hematoma. LDL 71 HgbA1c 7.4 EEG no seizure activity VTE prophylaxis - SCDs Eliquis (apixaban) daily but off for colonoscopy prior to admission, now back on Eliquis Therapy recommendations:  pending Disposition:  pending, poor prognosis, repeat family meeting today  B/l PE s/p  IV TNK and unsuccessful thrombectomy PEA arrest during bilateral pulmonary artery thrombectomy Hypotension with bradycardia CPR 7146m before ROSC Initiation of Arctic Sun by CCM MRI showed evidence of anoxic brain injury Poor prognosis family meeting for GOC discussion to be held on Wednesday 07/31/21  Respiratory failure Intubated for cardiac arrest resuscitation On vent Not candidate for extubation Vent management per CCM  Hypotension Likely due to PEA arrest Was on levophed, now no longer requiring vasopressors Home BP medication- metoprolol 50mg   Hyperlipidemia Home meds:  Lovastatin 20mg , Pravastatin 20 mg ordered in hospital LDL 71, goal < 70 Continue statin at discharge  Diabetes type II uncontrolled HgbA1c 7.4, goal < 7.0 SSI CBG monitoring  Tobacco abuse Current smoker Smoking cessation counseling will be provided  Other Stroke Risk Factors Advanced Age >/= 5165  Obesity, Body mass index is 31.97 kg/m., BMI >/= 30 associated with increased stroke risk, recommend weight loss, diet and exercise as appropriate  Hx of stroke in 1998 - no details Hx of DVT on eliquis Obstructive sleep apnea, on CPAP at home  Other Active Problems Acute blood loss anemia -stabilized- Hb 7.6-> 8.2.  -> 8.1-> 8.7-> 8.8-> 8.8-> 8.7 Pan CT showed no retroperitoneal hematoma.  Continue monitoring. AKI creatinine -resolved- 1.55-> 1.16-> 1.18-> 1.06- >1.09->0.99->1.06 Leukocytosis WBC -resolved - 11.4->  7.7-> 6.8-> 8.7 -> 7.9-> 7.0-> 8.4 ->9.0->10.3   Hospital day # 12  Patient seen and examined by NP/APP with MD. MD to update note as needed.   Cortney E Ernestina Jaime Whitaker , MSN, AGACNP-BC [facility] See Amion for schedule and pager information 07/29/2021 1:57 PM   STROKE MD NOTE : I have personally obtained history,examined this patient, reviewed notes, independently viewed imaging studies, participated in medical decision making and plan of care.ROS completed by me personally and pertinent positives fully documented  I have made any additions or clarifications directly to the above note. Agree with note above.  Patient continues to have a very poor neurological exam despite being off sedation for several days and remains comatose and unresponsive requiring full ventilatory support.  Chances of him surviving this and making any meaningful neurological recovery quite negligible.  Family has been made aware and they are struggling with the decision to withdraw life support with likely supporting for 14 days on the ventilator for the medical decision.  No family available at the bedside for discussion.  Discussed with Dr. Isaiah SergeMannam critical care medicine.This patient is critically ill and at significant risk of neurological worsening, death and care requires constant monitoring of vital signs, hemodynamics,respiratory and cardiac monitoring, extensive review of multiple databases, frequent neurological assessment, discussion  with family, other specialists and medical decision making of high complexity.I have made any additions or clarifications directly to the above note.This critical care time does not reflect procedure time, or teaching time or supervisory time of PA/NP/Med Resident etc but could involve care discussion time.  I spent 30 minutes of neurocritical care time  in the care of  this patient.      Delia Heady, MD Medical Director [facility] Pager:  [phone] 07/29/2021 2:57 PM   To contact Stroke Continuity provider, please refer to [email or web address]. After hours, contact General Neurology

## 2021-07-29 NOTE — Progress Notes (Signed)
Patient's blood sugars did not transfer over throughout entirety of shift.  CBG's were the following: 2000 - [place], RN

## 2021-07-29 NOTE — Progress Notes (Signed)
Brief Palliative Medicine Progress Note:  Called daughter/Jaime Whitaker to arrange next family meeting. She expresses frustration with medical team "rushing" family to make decisions as she was told meeting Friday 08/09/2021 was "too late." Reviewed previous discussion/education about increased risk for ventilator complications after day 14 - reviewed Wednesday 07/31/21 will be 14 days patient is on the ventilator and encouraged family meeting before this date. Answered all questions. Jaime Whitaker feels family can meet again Wednesday 1/4 at 2:00pm. She will speak with family and call PMT back to confirm.   Also attempted to call son/Jaime Whitaker to discuss family meeting - he did not answer and was not able to leave voicemail.  Thank you for allowing PMT to assist in the care of this patient.  Jaime Decelle M. Katrinka Blazing, FNP-BC Palliative Medicine Team Team Phone: [phone] 15 minutes  Greater than 50%  of this time was spent counseling and coordinating care related to the above assessment and plan.

## 2021-07-29 NOTE — Progress Notes (Signed)
NAME:  Jaime Whitaker, MRN:  [id number], DOB:  1941/03/08, LOS: 12 ADMISSION DATE:  07/09/2021, CONSULTATION DATE:  07/23/2021 REFERRING MD:  Corliss Skains, CHIEF COMPLAINT:  weakness   History of Present Illness:  80yM on eliquis for ?recently discovered DVT, IDA, PMR, OSA, CVA in 1994. History is obtained through chart review and discussion with the patient's wife. He was planning to undergo EGD for intervention on AVM felt to be potential culprit for IDA today and had held eliquis since 12/18. Today had R sided weakness, aphasia and gaze deviation.  He was also noted to be hypoxic in ED requiring NRB. Found to have L M1 occlusion now s/p aspiration with TICI 3 revascularization. Remained hypoxic after reversal of NMB, left intubated and arrived to neuro ICU on 100 of neo, 50 of propofol, saline infusion at 75 mL/hr  Pertinent  Medical History  DVT, IDA, PMR, OSA, CVA  Significant Hospital Events: Including procedures, antibiotic start and stop dates in addition to other pertinent events   07/11/2021 intubated, mechanical thrombectomy L M1 with TICI 3 revascularization 12/22 mechanical thrombectomy c/b 30 min in and out of PEA arrest, systemic TNK for PE. Made DNR after discussion with family. Normothermia protocol initiated 12/24 left femoral central line placed 12/25 GI bleeding but HDS and has known AVM continuing heparin, MR brain with extensive ischemic burden 12/28 weaning on pressure support 5/5 but mental status is poor 12/30 ongoing goals of care discussion.  Family wants to wait 1 more week with hope for recovery.  However no trach/PEG  Interim History / Subjective:   No acute events.  Remains on the ventilator weaning on pressure support.  No family at bedside  Objective   Blood pressure 119/78, pulse (!) 115, temperature 99.7 F (37.6 C), temperature source Oral, resp. rate (!) 25, height 5\' 9"  (1.753 m), weight 98.2 kg, SpO2 100 %.    Vent Mode: PSV;CPAP FiO2 (%):  [30 %] 30  % Set Rate:  [16 bmp] 16 bmp Vt Set:  [560 mL] 560 mL PEEP:  [5 cmH20] 5 cmH20 Pressure Support:  [5 cmH20] 5 cmH20 Plateau Pressure:  [19 cmH20] 19 cmH20   Intake/Output Summary (Last 24 hours) at 07/29/2021 0754 Last data filed at 07/29/2021 0700 Gross per 24 hour  Intake 2630.7 ml  Output 2050 ml  Net 580.7 ml   Filed Weights   07/27/21 0500 07/28/21 0307 07/29/21 0500  Weight: 102.2 kg 101.3 kg 98.2 kg    Examination: Gen:      No acute distress HEENT:  EOMI, sclera anicteric Neck:     No masses; no thyromegaly Lungs:    Clear to auscultation bilaterally; normal respiratory effort CV:         Regular rate and rhythm; no murmurs Abd:      + bowel sounds; soft, non-tender; no palpable masses, no distension Ext:    No edema; adequate peripheral perfusion Skin:      Warm and dry; no rash Neuro: Sedated, unresponsive  Labs/ Imaging reviewed Labs are stable, no new imaging  Resolved Hospital Problem list   Non-oliguric AKI vs CKD  Assessment & Plan:  Left M1 occlusion s/p thrombectomy, systemic TNKase PEA arrest with anoxic brain injury S/p normothermia protocol Continue supportive care  Acute blood loss anemia, possible GI bleed Has known AVM, hemoglobin has remained stable Monitor CBC while he is on anticoagulation Continue Protonix  Acute hypoxic respiratory failure, bilateral PE, DVT History of OSA Continue vent support Heparin transitioned  to Eliquis  PMR Continue daily prednisone  DM  Continue SSI, novolog  Best Practice (right click and "Reselect all SmartList Selections" daily)   Diet/type: tubefeeds DVT prophylaxis: DOAC GI prophylaxis: PPI Lines: N/A Foley:  N/A Code Status:  DNR Last date of multidisciplinary goals of care discussion [Palliative care involved, appreciate assistance. Made DNR after discussion post arrest 12/22.  Per palliative care notes family wants to wait till next week before making decision.]  Critical care time: NA    Chilton Greathouse MD [facility] See Amion for pager  If no response to pager , please call [phone] until 7pm After 7:00 pm call Elink  [phone] 07/29/2021, 7:54 AM

## 2021-07-30 DIAGNOSIS — J9601 Acute respiratory failure with hypoxia: Secondary | ICD-10-CM | POA: Diagnosis not present

## 2021-07-30 DIAGNOSIS — J9602 Acute respiratory failure with hypercapnia: Secondary | ICD-10-CM | POA: Diagnosis not present

## 2021-07-30 LAB — GLUCOSE, CAPILLARY
Glucose-Capillary: 167 mg/dL — ABNORMAL HIGH (ref 70–99)
Glucose-Capillary: 167 mg/dL — ABNORMAL HIGH (ref 70–99)
Glucose-Capillary: 175 mg/dL — ABNORMAL HIGH (ref 70–99)
Glucose-Capillary: 178 mg/dL — ABNORMAL HIGH (ref 70–99)
Glucose-Capillary: 180 mg/dL — ABNORMAL HIGH (ref 70–99)
Glucose-Capillary: 180 mg/dL — ABNORMAL HIGH (ref 70–99)
Glucose-Capillary: 183 mg/dL — ABNORMAL HIGH (ref 70–99)
Glucose-Capillary: 191 mg/dL — ABNORMAL HIGH (ref 70–99)
Glucose-Capillary: 192 mg/dL — ABNORMAL HIGH (ref 70–99)
Glucose-Capillary: 195 mg/dL — ABNORMAL HIGH (ref 70–99)
Glucose-Capillary: 201 mg/dL — ABNORMAL HIGH (ref 70–99)
Glucose-Capillary: 203 mg/dL — ABNORMAL HIGH (ref 70–99)
Glucose-Capillary: 204 mg/dL — ABNORMAL HIGH (ref 70–99)
Glucose-Capillary: 205 mg/dL — ABNORMAL HIGH (ref 70–99)
Glucose-Capillary: 207 mg/dL — ABNORMAL HIGH (ref 70–99)
Glucose-Capillary: 210 mg/dL — ABNORMAL HIGH (ref 70–99)
Glucose-Capillary: 210 mg/dL — ABNORMAL HIGH (ref 70–99)
Glucose-Capillary: 212 mg/dL — ABNORMAL HIGH (ref 70–99)
Glucose-Capillary: 213 mg/dL — ABNORMAL HIGH (ref 70–99)
Glucose-Capillary: 214 mg/dL — ABNORMAL HIGH (ref 70–99)
Glucose-Capillary: 224 mg/dL — ABNORMAL HIGH (ref 70–99)
Glucose-Capillary: 240 mg/dL — ABNORMAL HIGH (ref 70–99)
Glucose-Capillary: 241 mg/dL — ABNORMAL HIGH (ref 70–99)
Glucose-Capillary: 243 mg/dL — ABNORMAL HIGH (ref 70–99)
Glucose-Capillary: 228 mg/dL — ABNORMAL HIGH (ref 70–99)
Glucose-Capillary: 213 mg/dL — ABNORMAL HIGH (ref 70–99)

## 2021-07-30 LAB — CBC
HCT: 27.6 % — ABNORMAL LOW (ref 39.0–52.0)
Hemoglobin: 8.5 g/dL — ABNORMAL LOW (ref 13.0–17.0)
MCH: 29.4 pg (ref 26.0–34.0)
MCHC: 30.8 g/dL (ref 30.0–36.0)
MCV: 95.5 fL (ref 80.0–100.0)
Platelets: 568 10*3/uL — ABNORMAL HIGH (ref 150–400)
RBC: 2.89 MIL/uL — ABNORMAL LOW (ref 4.22–5.81)
RDW: 17.9 % — ABNORMAL HIGH (ref 11.5–15.5)
WBC: 9.3 10*3/uL (ref 4.0–10.5)
nRBC: 0 % (ref 0.0–0.2)

## 2021-07-30 LAB — BASIC METABOLIC PANEL
Anion gap: 8 (ref 5–15)
BUN: 30 mg/dL — ABNORMAL HIGH (ref 8–23)
CO2: 25 mmol/L (ref 22–32)
Calcium: 8.9 mg/dL (ref 8.9–10.3)
Chloride: 104 mmol/L (ref 98–111)
Creatinine, Ser: 1.07 mg/dL (ref 0.61–1.24)
GFR, Estimated: >60 mL/min (ref >60)
Glucose, Bld: 208 mg/dL — ABNORMAL HIGH (ref 70–99)
Potassium: 4.6 mmol/L (ref 3.5–5.1)
Sodium: 137 mmol/L (ref 135–145)

## 2021-07-30 MED ORDER — REVEFENACIN 175 MCG/3ML IN SOLN
175.0000 ug | Freq: Every day | RESPIRATORY_TRACT | Status: DC
  Filled 2021-07-30: qty 3

## 2021-07-30 MED ORDER — ARFORMOTEROL TARTRATE 15 MCG/2ML IN NEBU
15.0000 ug | INHALATION_SOLUTION | Freq: Two times a day (BID) | RESPIRATORY_TRACT | Status: DC
  Filled 2021-07-30: qty 2

## 2021-07-30 MED ORDER — ARFORMOTEROL TARTRATE 15 MCG/2ML IN NEBU
15.0000 ug | INHALATION_SOLUTION | Freq: Two times a day (BID) | RESPIRATORY_TRACT | Status: DC
  Administered 2021-07-30 – 2021-08-01 (×5): 15 ug via RESPIRATORY_TRACT
  Filled 2021-07-30 (×4): qty 2

## 2021-07-30 MED ORDER — REVEFENACIN 175 MCG/3ML IN SOLN
175.0000 ug | Freq: Every day | RESPIRATORY_TRACT | Status: DC
  Administered 2021-07-31 – 2021-08-01 (×2): 175 ug via RESPIRATORY_TRACT
  Filled 2021-07-30 (×3): qty 3

## 2021-07-30 NOTE — Progress Notes (Signed)
Nutrition Follow-up  DOCUMENTATION CODES:   Not applicable  INTERVENTION:   Tube feeding via OG tube: Vital 1.5 at 60 ml/h (1440 ml per day) Prosource TF 45 ml TID  Provides 2280 kcal, 130 gm protein, 1094 ml free water daily   NUTRITION DIAGNOSIS:   Inadequate oral intake related to inability to eat as evidenced by NPO status. Ongoing.   GOAL:   Patient will meet greater than or equal to 90% of their needs Met with TF at goal  MONITOR:   TF tolerance, Labs  REASON FOR ASSESSMENT:   Consult, Ventilator Enteral/tube feeding initiation and management  ASSESSMENT:   Pt with PMH of OSA, CVA, on eliquis for recent discovered DVT, IDA, PMR, OSA. Eliquis held 12/18 for planned EGD for AVM however admitted with R sided weakness, aphasia and new L M1 occlusion.   Ongoing family meetings, plan for next 1/4.  Per neuro exam is unchanged.  Admission weight 98.9 kg; currently 98.1 kg  12/21 s/p IR for mechanical thrombectomy L M1 with TICI 3 revascularization. Pt with PEA arrest in IR due to massive PE 12/22 started on arctic sun/normothermia; s/p bronch    Patient is currently intubated on ventilator support Temp (24hrs), Avg:99.6 F (37.6 C), Min:98.6 F (37 C), Max:99.9 F (37.7 C)   Medications reviewed and include: SSI, novolog, protonix, prednisone   NS @ 50 ml/hr   Labs reviewed: CBG's: 167-210 A1C: 7.4   38F OG tube: per xray feeding tube coursing below the diaphragm     Diet Order:   Diet Order             Diet NPO time specified  Diet effective now                   EDUCATION NEEDS:   Not appropriate for education at this time  Skin:  Skin Assessment: Reviewed RN Assessment  Last BM:  1/2 medium; rectal tube/pouch  Height:   Ht Readings from Last 1 Encounters:  07/09/2021 _0  (1.753 m)    Weight:   Wt Readings from Last 1 Encounters:  07/30/21 98.1 kg    BMI:  Body mass index is 31.94 kg/m.  Estimated Nutritional Needs:    Kcal:  2200  Protein:  120-140 grams  Fluid:  >2 L/day  Lockie Pares., RD, LDN, CNSC See AMiON for contact information

## 2021-07-30 NOTE — Progress Notes (Addendum)
NAME:  Jaime Whitaker, MRN:  [id number], DOB:  [birth year], LOS: 13 ADMISSION DATE:  07/13/2021, CONSULTATION DATE:  07/12/2021 REFERRING MD:  Corliss Skains, CHIEF COMPLAINT:  weakness   History of Present Illness:  80yM on eliquis for ?recently discovered DVT, IDA, PMR, OSA, CVA in 1994. History is obtained through chart review and discussion with the patient's wife. He was planning to undergo EGD for intervention on AVM felt to be potential culprit for IDA today and had held eliquis since 12/18. Today had R sided weakness, aphasia and gaze deviation.  He was also noted to be hypoxic in ED requiring NRB. Found to have L M1 occlusion now s/p aspiration with TICI 3 revascularization. Remained hypoxic after reversal of NMB, left intubated and arrived to neuro ICU on 100 of neo, 50 of propofol, saline infusion at 75 mL/hr  Pertinent  Medical History  DVT, IDA, PMR, OSA, CVA  Significant Hospital Events: Including procedures, antibiotic start and stop dates in addition to other pertinent events   07/14/2021 intubated, mechanical thrombectomy L M1 with TICI 3 revascularization 12/22 mechanical thrombectomy c/b 30 min in and out of PEA arrest, systemic TNK for PE. Made DNR after discussion with family. Normothermia protocol initiated 12/24 left femoral central line placed 12/25 GI bleeding but HDS and has known AVM continuing heparin, MR brain with extensive ischemic burden 12/28 weaning on pressure support 5/5 but mental status is poor 12/30 ongoing goals of care discussion.  Family wants to wait 1 more week with hope for recovery.  However no trach/PEG  Interim History / Subjective:   No acute events overnight. Increasing RR on SBT this AM.   Patient's daughter at the bedside.   Objective   Blood pressure (!) 155/77, pulse (!) 112, temperature 98.6 F (37 C), temperature source Axillary, resp. rate (!) 25, height 5\' 9"  (1.753 m), weight 98.1 kg, SpO2 99 %.    Vent Mode: PSV;CPAP FiO2 (%):  [30 %]  30 % Set Rate:  [16 bmp] 16 bmp Vt Set:  [560 mL] 560 mL PEEP:  [5 cmH20] 5 cmH20 Pressure Support:  [8 cmH20] 8 cmH20 Plateau Pressure:  [10 cmH20-23 cmH20] 10 cmH20   Intake/Output Summary (Last 24 hours) at 07/30/2021 0754 Last data filed at 07/30/2021 0600 Gross per 24 hour  Intake 2528.3 ml  Output 1900 ml  Net 628.3 ml   Filed Weights   07/28/21 0307 07/29/21 0500 07/30/21 0443  Weight: 101.3 kg 98.2 kg 98.1 kg    Examination: General: elderly male, no acute distress, intubated HENT: moist mucous membranes, pin point pupils, sclera anicteric Lungs: course breath sounds, no wheezing Cardiovascular: rrr, no murmurs Abdomen: soft, non-tender, BS+ Extremities: warm, no edema Neuro: unresponsive  Resolved Hospital Problem list   Non-oliguric AKI vs CKD  Assessment & Plan:  Left M1 occlusion s/p thrombectomy, systemic TNKase PEA arrest with anoxic brain injury S/p normothermia protocol Continue supportive care  Acute blood loss anemia, possible GI bleed Has known AVM, hemoglobin has remained stable Monitor CBC while he is on anticoagulation Continue Protonix  Acute hypoxic respiratory failure, bilateral PE, DVT History of OSA Continue vent support Heparin transitioned to Eliquis Start brovana/yupelri nebs SBT not well tolerated this AM due to tachypnea  PMR Continue daily prednisone  DM  Continue SSI, novolog  Best Practice (right click and "Reselect all SmartList Selections" daily)   Diet/type: tubefeeds DVT prophylaxis: DOAC GI prophylaxis: PPI Lines: N/A Foley:  N/A Code Status:  DNR Last date of multidisciplinary  goals of care discussion [Palliative care involved, appreciate assistance. Made DNR after discussion post arrest 12/22.  Next family meeting 1/4. Update patient's daughter, [name] 1/3]  Critical care time: 9 minutes   Melody Comas, MD  [facility] Office: [phone]   See Amion for personal pager PCCM on call  pager [phone] until 7pm. Please call Elink 7p-7a. [phone]

## 2021-07-30 NOTE — Progress Notes (Addendum)
STROKE TEAM PROGRESS NOTE   INTERVAL HISTORY Patient is seen in his room with no family at the bedside.  He has been hemodynamically stable and his neurological exam is unchanged.  Family meeting to discuss GOC will take place tomorrow.  Vital signs are stable  Vitals:   07/30/21 0733 07/30/21 0800 07/30/21 0900 07/30/21 0936  BP:  (!) 156/79 123/76   Pulse: (!) 112 (!) 109 (!) 103 (!) 116  Resp: (!) 25 (!) 24    Temp:  99.9 F (37.7 C)    TempSrc:  Axillary    SpO2: 99% 98% 97% 99%  Weight:      Height:       CBC:  Recent Labs  Lab 07/27/21 0328 07/30/21 0846  WBC 10.3 9.3  HGB 8.7* 8.5*  HCT 27.6* 27.6*  MCV 94.2 95.5  PLT 461* 568*    Basic Metabolic Panel:  Recent Labs  Lab 07/29/21 0544 07/30/21 0846  NA 134* 137  K 4.6 4.6  CL 103 104  CO2 24 25  GLUCOSE 168* 208*  BUN 28* 30*  CREATININE 1.04 1.07  CALCIUM 9.2 8.9    Lipid Panel:  Recent Labs  Lab 07/28/21 0433  TRIG 95    HgbA1c:  No results for input(s): HGBA1C in the last 168 hours.  Urine Drug Screen: No results for input(s): LABOPIA, COCAINSCRNUR, LABBENZ, AMPHETMU, THCU, LABBARB in the last 168 hours.  Alcohol Level  No results for input(s): ETH in the last 168 hours.   IMAGING past 24 hours No results found.  PHYSICAL EXAM General - Well nourished, well developed, elderly male intubated off sedation.  Cardiovascular - Regular rate and rhythm.   Neuro - intubated off sedation, unresponsive.  Eyes are closed.  Pupils equal and nonreactive. Bilateral corneal reflex present.  Minimal cough with inline suctioning, minimal gag.  No spontaneous extremity movements.  Flickers BLE to noxious stimuli  ASSESSMENT/PLAN Jaime Whitaker is a 81 y.o. male with history of HTN, anemia, sleep apnea, stroke in 1998, HLD, eliquis use for DVT in RLE up until 12/18 presenting with right sided weakness and aphasia. Patient presented with aphasia and right hemiplegia and yesterday underwent emergent  mechanical thrombectomy for left M1 occlusion with excellent  TICI 3 revascularization but unfortunately last night developed massive pulmonary embolism with low blood pressure developed bradycardic PEA arrest during pulmonary thrombectomy procedure requiring 30 minutes of CPR before  ROSC.  He has remained unresponsive and on full ventilatory support and vasopressor support. Developed massive PE and went to IR overnight 12/21 for a pulmonary arterial thrombectomy with a subsequent PEA arrest in IR.  He was treated with IV TNKase for systemic thrombolysis.  CCM initiated arctic sun for possible hypoxic brian injury and an overnight EEG. MRI after completion of hypothermia shows extensive involvement of the left cerebral hemisphere in the left MCA territory, left PCA territory and left watershed territories. Involvement of multiple vascular territories raises the possibility of an embolic process. Superimposed acute hypoxic/ischemic injury cannot be excluded.  Etiology for patient stroke involving pattern, source unclear but likely related to hypercoagulable state given history of DVT and recurrent PE.  However, with extensive stroke and anoxic brain injury on MRI, patient poor neuro exam without sedation, likely poor prognosis. Patient remains on Eliquis and ASA 81. No sedation on board currently.  Stroke: embolic shower but largest at left MCA with L M1 occlusion s/p IR with Left M1 TICI3 but complicated by bilateral PE and  PEA arrest s/p  IV TNK and unsuccessful thrombectomy Code Stroke- Hyperdense L MCA, chronic small-vessel ischemic changes of white matter. Old thalamic infarction  CTA head & neck-Occlusion of L MCA at mid M1. Mild atherosclerotic plaque.  CT perfusion- Left MCA vascular territory totally , no core infarct  MRI  Extensive acute/subacute infarcts within the supratentorial and infratentorial brain, greatest involvement of L MCA, L PCA, and L watershed territory. Embolic with  hypoxic/ischemic injury 2D Echo EF 60-65%, mild dilation of ascending aorta, interatrial septum not well visualized Right groin ultrasound done and shows hematoma. LDL 71 HgbA1c 7.4 EEG no seizure activity VTE prophylaxis - SCDs Eliquis (apixaban) daily but off for colonoscopy prior to admission, now back on Eliquis Therapy recommendations:  pending Disposition:  pending, poor prognosis, repeat family meeting tomorrow  B/l PE s/p  IV TNK and unsuccessful thrombectomy PEA arrest during bilateral pulmonary artery thrombectomy Hypotension with bradycardia CPR 104m before ROSC Initiation of Arctic Sun by CCM MRI showed evidence of anoxic brain injury Poor prognosis family meeting for GOC discussion to be held on Wednesday 07/31/21  Respiratory failure Intubated for cardiac arrest resuscitation On vent Not candidate for extubation Vent management per CCM  Hypotension Likely due to PEA arrest Was on levophed, now no longer requiring vasopressors Home BP medication- metoprolol 50mg   Hyperlipidemia Home meds:  Lovastatin 20mg , Pravastatin 20 mg ordered in hospital LDL 71, goal < 70 Continue statin at discharge  Diabetes type II uncontrolled HgbA1c 7.4, goal < 7.0 SSI CBG monitoring  Tobacco abuse Current smoker Smoking cessation counseling will be provided  Other Stroke Risk Factors Advanced Age >/= 55  Obesity, Body mass index is 31.94 kg/m., BMI >/= 30 associated with increased stroke risk, recommend weight loss, diet and exercise as appropriate  Hx of stroke in 1998 - no details Hx of DVT on eliquis Obstructive sleep apnea, on CPAP at home  Other Active Problems Acute blood loss anemia -stabilized- Hb 7.6-> 8.2.  -> 8.1-> 8.7-> 8.8-> 8.8-> 8.7 Pan CT showed no retroperitoneal hematoma.  Continue monitoring. AKI creatinine -resolved- 1.55-> 1.16-> 1.18-> 1.06- >1.09->0.99->1.06 Leukocytosis WBC -resolved - 11.4-> 7.7-> 6.8-> 8.7 -> 7.9-> 7.0-> 8.4  ->9.0->10.3   Hospital day # 13  Patient seen and examined by NP/APP with MD. MD to update note as needed.   Cortney E 78 , MSN, AGACNP-BC [facility] See Amion for schedule and pager information 07/30/2021 10:25 AM  Stroke MD Note ;Patient continues to have a very poor neurological exam despite being off sedation for several days and remains comatose and unresponsive requiring full ventilatory support.  Chances of him surviving this and making any meaningful neurological recovery quite negligible.  Family has been made aware and they are struggling with the decision to withdraw life support with likely supporting for 14 days on the ventilator for the medical decision.  No family available at the bedside for discussion.  Discussed with Dr. Ernestina Columbia critical care medicine.This patient is critically ill and at significant risk of neurological worsening, death and care requires constant monitoring of vital signs, hemodynamics,respiratory and cardiac monitoring, extensive review of multiple databases, frequent neurological assessment, discussion with family, other specialists and medical decision making of high complexity.I have made any additions or clarifications directly to the above note.This critical care time does not reflect procedure time, or teaching time or supervisory time of PA/NP/Med Resident etc but could involve care discussion time.  I spent 32 minutes of neurocritical care time  in the  care of  this patient. Delia Heady, MD  To contact Stroke Continuity provider, please refer to [email or web address]. After hours, contact General Neurology

## 2021-07-30 NOTE — Progress Notes (Addendum)
Brief Palliative Medicine Progress Note:  Called son/Azim Wool to confirm family meeting for tomorrow 1/4 at 2:00 PM - meeting confirmed.   Thank you for allowing PMT to assist in the care of this patient.  Krystyl Cannell M. Katrinka Blazing [facility] Palliative Medicine Team Team Phone: [phone] NO CHARGE

## 2021-07-31 DIAGNOSIS — J9602 Acute respiratory failure with hypercapnia: Secondary | ICD-10-CM | POA: Diagnosis not present

## 2021-07-31 DIAGNOSIS — J9601 Acute respiratory failure with hypoxia: Secondary | ICD-10-CM | POA: Diagnosis not present

## 2021-07-31 DIAGNOSIS — R61 Generalized hyperhidrosis: Secondary | ICD-10-CM

## 2021-07-31 LAB — CBC
HCT: 26.4 % — ABNORMAL LOW (ref 39.0–52.0)
Hemoglobin: 8.3 g/dL — ABNORMAL LOW (ref 13.0–17.0)
MCH: 29.7 pg (ref 26.0–34.0)
MCHC: 31.4 g/dL (ref 30.0–36.0)
MCV: 94.6 fL (ref 80.0–100.0)
Platelets: 557 10*3/uL — ABNORMAL HIGH (ref 150–400)
RBC: 2.79 MIL/uL — ABNORMAL LOW (ref 4.22–5.81)
RDW: 17.8 % — ABNORMAL HIGH (ref 11.5–15.5)
WBC: 8.9 10*3/uL (ref 4.0–10.5)
nRBC: 0 % (ref 0.0–0.2)

## 2021-07-31 LAB — BASIC METABOLIC PANEL
Anion gap: 6 (ref 5–15)
BUN: 33 mg/dL — ABNORMAL HIGH (ref 8–23)
CO2: 25 mmol/L (ref 22–32)
Calcium: 9.2 mg/dL (ref 8.9–10.3)
Chloride: 108 mmol/L (ref 98–111)
Creatinine, Ser: 1.02 mg/dL (ref 0.61–1.24)
GFR, Estimated: >60 mL/min (ref >60)
Glucose, Bld: 188 mg/dL — ABNORMAL HIGH (ref 70–99)
Potassium: 4.5 mmol/L (ref 3.5–5.1)
Sodium: 139 mmol/L (ref 135–145)

## 2021-07-31 LAB — GLUCOSE, CAPILLARY
Glucose-Capillary: 174 mg/dL — ABNORMAL HIGH (ref 70–99)
Glucose-Capillary: 187 mg/dL — ABNORMAL HIGH (ref 70–99)
Glucose-Capillary: 191 mg/dL — ABNORMAL HIGH (ref 70–99)
Glucose-Capillary: 212 mg/dL — ABNORMAL HIGH (ref 70–99)
Glucose-Capillary: 211 mg/dL — ABNORMAL HIGH (ref 70–99)

## 2021-07-31 LAB — TRIGLYCERIDES: Triglycerides: 83 mg/dL (ref <150)

## 2021-07-31 MED ORDER — SODIUM CHLORIDE 0.9 % IV SOLN
1.0000 mg/h | INTRAVENOUS | Status: DC
  Administered 2021-07-31 – 2021-08-01 (×2): 1 mg/h via INTRAVENOUS
  Filled 2021-07-31 (×3): qty 2.5

## 2021-07-31 MED ORDER — HYDROMORPHONE BOLUS VIA INFUSION
1.0000 mg | INTRAVENOUS | Status: DC | PRN
  Administered 2021-08-01 (×4): 1 mg via INTRAVENOUS
  Filled 2021-07-31: qty 1

## 2021-07-31 MED ORDER — LORAZEPAM 2 MG/ML IJ SOLN
1.0000 mg | INTRAMUSCULAR | Status: DC
  Administered 2021-07-31 – 2021-08-01 (×8): 1 mg via INTRAVENOUS
  Filled 2021-07-31 (×9): qty 1

## 2021-07-31 NOTE — Progress Notes (Addendum)
STROKE TEAM PROGRESS NOTE   INTERVAL HISTORY Patient is seen in his room with no family at the bedside.  He has been hemodynamically stable and his neurological exam is unchanged.  He remains ventilatory support for respiratory failure.  Family meeting today at 1400 with palliative, CCM, TOC, and neurology.   Vitals:   07/31/21 0736 07/31/21 0742 07/31/21 0800 07/31/21 0900  BP:   138/74 (!) 147/81  Pulse:   97 99  Resp:   17 (!) 21  Temp:   99.4 F (37.4 C)   TempSrc:   Axillary   SpO2: 100% 99% 100% 99%  Weight:      Height:       CBC:  Recent Labs  Lab 07/30/21 0846 07/31/21 0514  WBC 9.3 8.9  HGB 8.5* 8.3*  HCT 27.6* 26.4*  MCV 95.5 94.6  PLT 568* 557*    Basic Metabolic Panel:  Recent Labs  Lab 07/30/21 0846 07/31/21 0514  NA 137 139  K 4.6 4.5  CL 104 108  CO2 25 25  GLUCOSE 208* 188*  BUN 30* 33*  CREATININE 1.07 1.02  CALCIUM 8.9 9.2    Lipid Panel:  Recent Labs  Lab 07/31/21 0514  TRIG 83    HgbA1c:  No results for input(s): HGBA1C in the last 168 hours.  Urine Drug Screen: No results for input(s): LABOPIA, COCAINSCRNUR, LABBENZ, AMPHETMU, THCU, LABBARB in the last 168 hours.  Alcohol Level  No results for input(s): ETH in the last 168 hours.   IMAGING past 24 hours No results found.  PHYSICAL EXAM General - Well nourished, well developed, elderly male intubated off sedation.  Cardiovascular - Regular rate and rhythm.   Neuro - intubated off sedation, unresponsive.  Eyes are closed.  Pupils equal and nonreactive. Bilateral corneal reflex present.  Minimal cough with inline suctioning, minimal gag.  No spontaneous extremity movements.  Flickers BLE to noxious stimuli  ASSESSMENT/PLAN Jaime Whitaker is a 81 y.o. male with history of HTN, anemia, sleep apnea, stroke in 1998, HLD, eliquis use for DVT in RLE up until 12/18 presenting with right sided weakness and aphasia. Patient presented with aphasia and right hemiplegia and yesterday  underwent emergent mechanical thrombectomy for left M1 occlusion with excellent  TICI 3 revascularization but unfortunately last night developed massive pulmonary embolism with low blood pressure developed bradycardic PEA arrest during pulmonary thrombectomy procedure requiring 30 minutes of CPR before  ROSC.  He has remained unresponsive and on full ventilatory support and vasopressor support. Developed massive PE and went to IR overnight 12/21 for a pulmonary arterial thrombectomy with a subsequent PEA arrest in IR.  He was treated with IV TNKase for systemic thrombolysis.  CCM initiated arctic sun for possible hypoxic brian injury and an overnight EEG. MRI after completion of hypothermia shows extensive involvement of the left cerebral hemisphere in the left MCA territory, left PCA territory and left watershed territories. Involvement of multiple vascular territories raises the possibility of an embolic process. Superimposed acute hypoxic/ischemic injury cannot be excluded.  Etiology for patient stroke involving pattern, source unclear but likely related to hypercoagulable state given history of DVT and recurrent PE.  However, with extensive stroke and anoxic brain injury on MRI, patient poor neuro exam without sedation, likely poor prognosis. Patient remains on Eliquis and ASA 81. No sedation on board currently.  Stroke: embolic shower but largest at left MCA with L M1 occlusion s/p IR with Left M1 TICI3 but complicated by bilateral PE  and PEA arrest s/p  IV TNK and unsuccessful thrombectomy Code Stroke- Hyperdense L MCA, chronic small-vessel ischemic changes of white matter. Old thalamic infarction  CTA head & neck-Occlusion of L MCA at mid M1. Mild atherosclerotic plaque.  CT perfusion- Left MCA vascular territory totally , no core infarct  MRI  Extensive acute/subacute infarcts within the supratentorial and infratentorial brain, greatest involvement of L MCA, L PCA, and L watershed territory.  Embolic with hypoxic/ischemic injury 2D Echo EF 60-65%, mild dilation of ascending aorta, interatrial septum not well visualized Right groin ultrasound done and shows hematoma. LDL 71 HgbA1c 7.4 EEG no seizure activity VTE prophylaxis - SCDs Eliquis (apixaban) daily but off for colonoscopy prior to admission, now back on Eliquis Therapy recommendations:  pending Disposition:  pending, poor prognosis, repeat family meeting tomorrow  B/l PE s/p  IV TNK and unsuccessful thrombectomy PEA arrest during bilateral pulmonary artery thrombectomy Hypotension with bradycardia CPR 39m before ROSC Initiation of Arctic Sun by CCM MRI showed evidence of anoxic brain injury Poor prognosis family meeting for GOC discussion to be held on Wednesday 07/31/21  Respiratory failure Intubated for cardiac arrest resuscitation On vent Not candidate for extubation Vent management per CCM  Hypotension Likely due to PEA arrest Was on levophed, now no longer requiring vasopressors Home BP medication- metoprolol 50mg   Hyperlipidemia Home meds:  Lovastatin 20mg , Pravastatin 20 mg ordered in hospital LDL 71, goal < 70 Continue statin at discharge  Diabetes type II uncontrolled HgbA1c 7.4, goal < 7.0 SSI CBG monitoring  Tobacco abuse Current smoker Smoking cessation counseling will be provided  Other Stroke Risk Factors Advanced Age >/= 54  Obesity, Body mass index is 31.12 kg/m., BMI >/= 30 associated with increased stroke risk, recommend weight loss, diet and exercise as appropriate  Hx of stroke in 1998 - no details Hx of DVT on eliquis Obstructive sleep apnea, on CPAP at home  Other Active Problems Acute blood loss anemia -stabilized- Hb 7.6-> 8.2.  -> 8.1-> 8.7-> 8.8-> 8.8-> 8.7 Pan CT showed no retroperitoneal hematoma.  Continue monitoring. AKI creatinine -resolved- 1.55-> 1.16-> 1.18-> 1.06- >1.09->0.99->1.06 Leukocytosis WBC -resolved - 11.4-> 7.7-> 6.8-> 8.7 -> 7.9-> 7.0-> 8.4  ->9.0->10.3   Hospital day # 14  Patient seen and examined by NP/APP with MD. MD to update note as needed.   76, DNP, FNP-BC [facility] Pager: [phone]  I have personally obtained history,examined this patient, reviewed notes, independently viewed imaging studies, participated in medical decision making and plan of care.ROS completed by me personally and pertinent positives fully documented  I have made any additions or clarifications directly to the above note. Agree with note above.  Patient neurological exam remains quite poor and remains on ventilatory support for respiratory failure.  His prognosis is terrible is unlikely to be make meaningful neurological recovery and live reasonable quality of life.  Family seem to be having a difficult time understanding risks.  Plan for palliative care team meeting with family to discuss goals of care this afternoon.  Discussed with Dr. Elmer Picker critical care medicine. This patient is critically ill and at significant risk of neurological worsening, death and care requires constant monitoring of vital signs, hemodynamics,respiratory and cardiac monitoring, extensive review of multiple databases, frequent neurological assessment, discussion with family, other specialists and medical decision making of high complexity.I have made any additions or clarifications directly to the above note.This critical care time does not reflect procedure time, or teaching time or supervisory time of PA/NP/Med Resident  etc but could involve care discussion time.  I spent 30 minutes of neurocritical care time  in the care of  this patient.     Jaime HeadyPramod Garlan Drewes, MD Medical Director [facility] Pager: [phone] 07/31/2021 3:43 PM

## 2021-07-31 NOTE — Progress Notes (Signed)
Daily Progress Note   Patient Name: Jaime Whitaker       Date: 07/31/2021 DOB: 1941/05/24  Age: 81 y.o. MRN#: [id number] Attending Physician: Stroke, Md, MD Primary Care Physician: Caryl Bis, MD Admit Date: 07/11/2021  Reason for Consultation/Follow-up: Establishing goals of care  Subjective: Chart review performed.   Spoke with TOC, Neurology, CCM to coordinate for family meeting today - confirmed they could attend.  2:00 PM Met family in [place] conference room for scheduled meeting. 7 family members were present in person, one over speakerphone. Legal decision maker/wife/Judy was present in person.  Neurology and CCM provided medical updates - all questions answered. Patient is still with poor prognosis and likely no chance for meaningful recovery. At this time, he has failed all weaning trials from vent and is not a candidate for extubation unless transitioned to comfort. TOC provided family's requested information about LTACH locations - all questions answered.  We discussed patient's current illness and what it means in the larger context of patient's on-going co-morbidities. Natural disease trajectory and expectations at EOL were discussed. I attempted to elicit values and goals of care important to the patient. The difference between aggressive medical intervention (trach/PEG/LTACH) and comfort care was considered in light of the patient's goals of care.   Reviewed potential complications with pursuing trach/PEG/life prolonging interventions to include but not limited to: skin breakdown/pressure injury, infection, aspiration, sepsis, risk for recurrent blood clots, risk for another cardiac arrest, high risk for rehospitalization.   Allowed space and time for family to discuss thoughts  privately per their request.  When I re-entered room, all questions answered.  Detailed discussion was had again on what transition to comfort measures in house would entail inclusive of medications to control pain, dyspnea, agitation, nausea, and itching. We discussed stopping all unnecessary measures such as blood draws, needle sticks, oxygen, antibiotics, CBGs/insulin, cardiac monitoring, IVF, and frequent vital signs. Discussed extubation/oxygen use at EOL as well as symptom management plan if/when compassionately extubated. Education provided on process for compassionate extubation and expected prognosis could be minutes/hours.   Family were understandably tearful. All family are in agreement for transition to full comfort measures/compassionate extubation. There are other family members that would like to visit patient before compassionate extubation - they will be coming tomorrow. Plan for transition to full comfort/compassionate  extubation tomorrow 1/5 at 7pm.  Current visitation policy for EOL was reviewed - knowing goal is compassionate extubation tomorrow, will place unrestricted visitation order for family to visit. There will be grandchildren under 12 coming to see him tomorrow. Per policy they can come if they understand the widespread prevalence of respiratory viruses such as RSV and flu, which we discussed and they understand. Visitation was later discussed with primary RN, who is supportive.   Visit also consisted of discussions dealing with the complex and emotionally intense issues of symptom management and palliative care in the setting of serious and life-threatening illness.   Discussed with family the importance of continued conversation with each other and the medical providers regarding overall plan of care and treatment options, ensuring decisions are within the context of the patients values and GOCs.    Questions and concerns were addressed. The family was encouraged to call  with questions and/or concerns. PMT card was provided again.  Went to visit patient at bedside - he is lying in bed ventilated, not sedated. Signs of discomfort noted - diaphoretic. No respiratory distress, increased work of breathing, or secretions noted. Feeding tube in place.  Received notification that son/Andree had called with questions as well as daughter/Mary. Returned calls - answered questions around visitation and FMLA paperwork.   Clarified visitation/conference room use for family with Leverne Humbles, RN.   Received another call from son/[place] - family have concerns about visitation/conference room use. Returned to unit - met with family, security, and Conservation officer, historic buildings.  Discussed case and complex dynamics between family and RN staff with PMT Medical Director/Dr. Hilma Favors. Dr. Hilma Favors spoke with family and several RN staff directly.   Length of Stay: 14  Current Medications: Scheduled Meds:   apixaban  5 mg Per Tube BID   arformoterol  15 mcg Nebulization BID   aspirin  81 mg Per Tube Daily   brimonidine  1 drop Both Eyes Daily   chlorhexidine gluconate (MEDLINE KIT)  15 mL Mouth Rinse BID   Chlorhexidine Gluconate Cloth  6 each Topical Q0600   feeding supplement (PROSource TF)  45 mL Per Tube TID   insulin aspart  0-20 Units Subcutaneous Q4H   insulin aspart  14 Units Subcutaneous Q4H   latanoprost  1 drop Both Eyes QHS   mouth rinse  15 mL Mouth Rinse 10 times per day   pantoprazole sodium  40 mg Per Tube BID   pravastatin  20 mg Per Tube q1800   predniSONE  5 mg Per Tube Daily   revefenacin  175 mcg Nebulization Daily    Continuous Infusions:  sodium chloride 50 mL/hr at 07/31/21 1100   sodium chloride     feeding supplement (VITAL 1.5 CAL) 1,000 mL (07/30/21 1631)    PRN Meds: acetaminophen **OR** acetaminophen (TYLENOL) oral liquid 160 mg/5 mL **OR** acetaminophen, docusate, ondansetron (ZOFRAN) IV  Physical Exam Vitals and nursing note reviewed.  Constitutional:       General: He is not in acute distress.    Interventions: He is intubated.  Pulmonary:     Effort: No respiratory distress. He is intubated.  Skin:    General: Skin is warm and dry.  Neurological:     Mental Status: He is unresponsive.     Motor: Weakness present.  Psychiatric:        Speech: He is noncommunicative.            Vital Signs: BP (!) 159/92    Pulse [name])  112    Temp 99.4 F (37.4 C) (Axillary)    Resp (!) 23    Ht 5' 9"  (1.753 m)    Wt 95.6 kg    SpO2 97%    BMI 31.12 kg/m  SpO2: SpO2: 97 % O2 Device: O2 Device: Ventilator O2 Flow Rate:    Intake/output summary:  Intake/Output Summary (Last 24 hours) at 07/31/2021 1308 Last data filed at 07/31/2021 1100 Gross per 24 hour  Intake 2382.71 ml  Output 1300 ml  Net 1082.71 ml   LBM: Last BM Date: 07/31/21 Baseline Weight: Weight: 104.3 kg Most recent weight: Weight: 95.6 kg       Palliative Assessment/Data: PPS 30% with feeding tube in place      Patient Active Problem List   Diagnosis Date Noted   Pulmonary embolus ([place])    Obstructive cardiovascular shock ([place])    Stroke (cerebrum) ([place]) 07/14/2021   Middle cerebral artery embolism, left 07/27/2021   Acute respiratory failure with hypoxia and hypercapnia ([facility])    H. pylori infection 16/04/9603   Helicobacter pylori gastritis 06/17/2021   Positive occult stool blood test 05/28/2021   Melena 05/28/2021   Iron deficiency anemia 05/28/2021   Fatigue 05/28/2021    Palliative Care Assessment & Plan   Patient Profile: 81 y.o. male  with past medical history of hypertension and hyperlipidemia who presented to the emergency department on 06/27/2021 with right-sided weakness and right facial droop. Code stroke was called, but he was out of the window for TPA. He had been on Eliquis for a DVT, but was taken off it 12/18 for a GI procedure.  CTA head showed occlusion of the left middle cerebral artery at the mid M1 segment. He underwent complete revascularization of  the occluded left MCA and was left intubated as he was unable to maintain adequate O2 saturation after anesthesia reversal.  CTA chest showed very large acute bilateral pulmonary emboli , with near complete occlusion of the right pulmonary artery and left pulmonary artery saddle emboli extending substantially in the left upper and lower lobe. Also showed cardiomegaly indicating right heart strain and ascending thoracic aortic aneurysm.    12/22 - he underwent pulmonary angiogram and bilateral pulmonary artery aspiration thrombectomy. During the procedure, patient became bradycardic with loss of pulse - he was resuscitated with ROSC. Systemic tPA was administered due to inability to aspirate significant clot burden.  12/23 - EEG negative for seizures, but shows moderate to severe degree of encephalopathy.   Assessment: Left M1 occlusion s/p thrombectomy PEA arrest Anoxic brain injury Acute blood loss anemia Acute hypoxic respiratory failure Bilateral PE/DVT Non-oliguric AKI vs CKD  Recommendations/Plan: Family have made the decision to shift patient toward full comfort care. They have other family members that would like to visit with patient tomorrow prior to compassionate extubation. Family will be ready for extubation tomorrow 1/5 at 7pm Continue DNR/DNI as previously documented Added orders to reflect goal for comfort, as well as discontinued orders that were not focused on comfort Dilaudid drip at 8m/hr with 185mbolus dose q3042mPRN pain/distress Scheduled ativan 1mg69m q4h Discontinue feeding tube, aspirin, prednisone, lab and CBG monitoring, pravastatin, colace, protonix Unrestricted visitation orders were placed per current [facility] visitation policy  Nursing to provide frequent assessments and administer PRN medications as clinically necessary to ensure EOL comfort PMT will continue to follow and support holistically   Goals of Care and Additional  Recommendations: Limitations on Scope of Treatment: Full Comfort Care  and Minimize Medications  Code Status:    Code Status Orders  (From admission, onward)           Start     Ordered   07/18/21 0210  Do not attempt resuscitation (DNR)  Continuous       Question Answer Comment  In the event of cardiac or respiratory ARREST Do not call a code blue   In the event of cardiac or respiratory ARREST Do not perform Intubation, CPR, defibrillation or ACLS   In the event of cardiac or respiratory ARREST Use medication by any route, position, wound care, and other measures to relive pain and suffering. May use oxygen, suction and manual treatment of airway obstruction as needed for comfort.      07/18/21 0209           Code Status History     Date Active Date Inactive Code Status Order ID Comments User Context   07/06/2021 1553 07/18/2021 0209 Full Code [id number]  Luanne Bras, MD Inpatient   06/30/2021 1439 07/16/2021 1553 Full Code [id number]  Greta Doom, MD HOV   07/01/2021 1354 07/11/2021 1439 Full Code [id number]  Greta Doom, MD Inpatient       Prognosis:  Hours - Days  Discharge Planning: Anticipated Hospital Death  Care plan was discussed with primary RN, TOC, Neurology, CCM, patient's family, Dr. Hilma Favors  Thank you for allowing the Palliative Medicine Team to assist in the care of this patient.   Total Time 120 minutes Prolonged Time Billed  yes       Greater than 50%  of this time was spent counseling and coordinating care related to the above assessment and plan.  Lin Landsman, NP  Please contact Palliative Medicine Team phone at [phone] for questions and concerns.

## 2021-07-31 NOTE — Progress Notes (Signed)
NAME:  Jaime Whitaker, MRN:  [id number], DOB:  [birth year], LOS: 14 ADMISSION DATE:  2021/07/27, CONSULTATION DATE:  27-Jul-2021 REFERRING MD:  Corliss Skains, CHIEF COMPLAINT:  weakness   History of Present Illness:  80yM on eliquis for ?recently discovered DVT, IDA, PMR, OSA, CVA in 1994. History is obtained through chart review and discussion with the patient's wife. He was planning to undergo EGD for intervention on AVM felt to be potential culprit for IDA today and had held eliquis since 12/18. Today had R sided weakness, aphasia and gaze deviation.  He was also noted to be hypoxic in ED requiring NRB. Found to have L M1 occlusion now s/p aspiration with TICI 3 revascularization. Remained hypoxic after reversal of NMB, left intubated and arrived to neuro ICU on 100 of neo, 50 of propofol, saline infusion at 75 mL/hr  Pertinent  Medical History  DVT, IDA, PMR, OSA, CVA  Significant Hospital Events: Including procedures, antibiotic start and stop dates in addition to other pertinent events   07/27/2021 intubated, mechanical thrombectomy L M1 with TICI 3 revascularization 12/22 mechanical thrombectomy c/b 30 min in and out of PEA arrest, systemic TNK for PE. Made DNR after discussion with family. Normothermia protocol initiated 12/24 left femoral central line placed 12/25 GI bleeding but HDS and has known AVM continuing heparin, MR brain with extensive ischemic burden 12/28 weaning on pressure support 5/5 but mental status is poor 12/30 ongoing goals of care discussion.  Family wants to wait 1 more week with hope for recovery.  However no trach/PEG  Interim History / Subjective:   No acute events overnight. On PSV 10/5 this AM. Still not waking up or following commands.   Objective   Blood pressure 130/76, pulse 98, temperature 98.7 F (37.1 C), temperature source Oral, resp. rate 19, height 5\' 9"  (1.753 m), weight 95.6 kg, SpO2 100 %.    Vent Mode: PRVC FiO2 (%):  [30 %] 30 % Set Rate:  [16  bmp] 16 bmp Vt Set:  [560 mL] 560 mL PEEP:  [5 cmH20] 5 cmH20 Pressure Support:  [8 cmH20] 8 cmH20 Plateau Pressure:  [19 cmH20-21 cmH20] 19 cmH20   Intake/Output Summary (Last 24 hours) at 07/31/2021 0734 Last data filed at 07/31/2021 0700 Gross per 24 hour  Intake 2392.77 ml  Output 1300 ml  Net 1092.77 ml   Filed Weights   07/29/21 0500 07/30/21 0443 07/31/21 0429  Weight: 98.2 kg 98.1 kg 95.6 kg    Examination: General: elderly male, no acute distress, intubated HENT: moist mucous membranes, pin point pupils, sclera anicteric Lungs: course breath sounds, no wheezing Cardiovascular: rrr, no murmurs Abdomen: soft, non-tender, BS+ Extremities: warm, no edema Neuro: unresponsive  Resolved Hospital Problem list   Non-oliguric AKI vs CKD  Assessment & Plan:  Left M1 occlusion s/p thrombectomy, systemic TNKase PEA arrest with anoxic brain injury S/p normothermia protocol Continue supportive care  Acute blood loss anemia, possible GI bleed Has known AVM, hemoglobin has remained stable Monitor CBC while he is on anticoagulation Continue Protonix  Acute hypoxic respiratory failure, bilateral PE, DVT History of OSA Continue vent support Heparin transitioned to Eliquis Start brovana/yupelri nebs Has failed SBTs over recent days, tolerating 10/5 trial this AM  PMR Continue daily prednisone  DM  Continue SSI, novolog  Best Practice (right click and "Reselect all SmartList Selections" daily)   Diet/type: tubefeeds DVT prophylaxis: DOAC GI prophylaxis: PPI Lines: N/A Foley:  N/A Code Status:  DNR Last date of multidisciplinary goals of  care discussion [facility] care involved, appreciate assistance. Made DNR after discussion post arrest 12/22.  Next family meeting 1/4. Update patient's daughter, [name] 1/3]  Critical care time: 8 minutes   Melody Comas, MD [facility] Office: [phone]   See Amion for personal pager PCCM on call  pager [phone] until 7pm. Please call Elink 7p-7a. [phone]

## 2021-08-01 DIAGNOSIS — J9601 Acute respiratory failure with hypoxia: Secondary | ICD-10-CM | POA: Diagnosis not present

## 2021-08-01 DIAGNOSIS — J9602 Acute respiratory failure with hypercapnia: Secondary | ICD-10-CM | POA: Diagnosis not present

## 2021-08-01 DIAGNOSIS — R0600 Dyspnea, unspecified: Secondary | ICD-10-CM

## 2021-08-01 LAB — GLUCOSE, CAPILLARY
Glucose-Capillary: 248 mg/dL — ABNORMAL HIGH (ref 70–99)
Glucose-Capillary: 170 mg/dL — ABNORMAL HIGH (ref 70–99)
Glucose-Capillary: 162 mg/dL — ABNORMAL HIGH (ref 70–99)

## 2021-08-01 MED ORDER — GLYCOPYRROLATE 0.2 MG/ML IJ SOLN: 0.2000 mg | INTRAMUSCULAR | Status: DC | PRN

## 2021-08-01 MED ORDER — ALBUTEROL SULFATE (2.5 MG/3ML) 0.083% IN NEBU: 2.5000 mg | INHALATION_SOLUTION | RESPIRATORY_TRACT | Status: DC | PRN

## 2021-08-01 MED ORDER — GLYCOPYRROLATE 0.2 MG/ML IJ SOLN: 0.4000 mg | INTRAMUSCULAR | Status: DC | PRN

## 2021-08-01 MED ORDER — DIPHENHYDRAMINE HCL 50 MG/ML IJ SOLN: 25.0000 mg | INTRAMUSCULAR | Status: DC | PRN

## 2021-08-01 MED ORDER — GLYCOPYRROLATE 1 MG PO TABS
1.0000 mg | ORAL_TABLET | ORAL | Status: DC | PRN
  Filled 2021-08-01: qty 1

## 2021-08-01 MED ORDER — GLYCOPYRROLATE 0.2 MG/ML IJ SOLN
0.4000 mg | Freq: Once | INTRAMUSCULAR | Status: AC
  Administered 2021-08-01: 0.4 mg via INTRAVENOUS

## 2021-08-01 MED ORDER — POLYVINYL ALCOHOL 1.4 % OP SOLN
1.0000 [drp] | Freq: Four times a day (QID) | OPHTHALMIC | Status: DC | PRN
  Filled 2021-08-01: qty 15

## 2021-08-01 MED ORDER — GLYCOPYRROLATE 0.2 MG/ML IJ SOLN
0.2000 mg | INTRAMUSCULAR | Status: DC | PRN
  Administered 2021-08-01: 0.2 mg via INTRAVENOUS
  Filled 2021-08-01 (×2): qty 1

## 2021-08-01 MED ORDER — GLYCOPYRROLATE 1 MG PO TABS: 1.0000 mg | ORAL_TABLET | ORAL | Status: DC | PRN

## 2021-08-01 MED ORDER — GLYCOPYRROLATE 0.2 MG/ML IJ SOLN
0.6000 mg | INTRAMUSCULAR | Status: DC | PRN
  Administered 2021-08-01: 0.6 mg via INTRAVENOUS
  Filled 2021-08-01 (×2): qty 3

## 2021-08-01 NOTE — Progress Notes (Signed)
Nutrition Brief Note  Chart reviewed. Pt now transitioning to comfort care. TF d/c'ed.  No further nutrition interventions planned at this time. Please re-consult as needed.   Cammy Copa., RD, LDN, CNSC See AMiON for contact information

## 2021-08-01 NOTE — Progress Notes (Addendum)
Daily Progress Note   Patient Name: Jaime Whitaker       Date: 08/01/2021 DOB: [birth year]  Age: 81 y.o. MRN#: [id number] Attending Physician: Stroke, Md, MD Primary Care Physician: Richardean Chimera, MD Admit Date: 06/27/2021  HPI/Patient Profile: 81 y.o. male  with past medical history of hypertension and hyperlipidemia who presented to the emergency department on 07/13/2021 with right-sided weakness and right facial droop. Code stroke was called, but he was out of the window for TPA. He had been on Eliquis for a DVT, but was taken off it 12/18 for a GI procedure.  CTA head showed occlusion of the left middle cerebral artery at the mid M1 segment. He underwent complete revascularization of the occluded left MCA and was left intubated as he was unable to maintain adequate O2 saturation after anesthesia reversal.  CTA chest showed very large acute bilateral pulmonary emboli , with near complete occlusion of the right pulmonary artery and left pulmonary artery saddle emboli extending substantially in the left upper and lower lobe. Also showed cardiomegaly indicating right heart strain and ascending thoracic aortic aneurysm.    12/22 - he underwent pulmonary angiogram and bilateral pulmonary artery aspiration thrombectomy. During the procedure, patient became bradycardic with loss of pulse - he was resuscitated with ROSC. Systemic tPA was administered due to inability to aspirate significant clot burden.  12/23 - EEG negative for seizures, but shows moderate to severe degree of encephalopathy.     Subjective: Family made the difficult decision yesterday 1/4 to transition this patient to comfort care and proceed with compassionate extubation this evening. This NP was present at bedside to facilitate this  process.   Multiple family members are present at bedside. I provided education and counseling on the process of withdraw of ventilator support. Discussed that this process would be coordinated between RN, RT, and myself. Spiritual care support was offered and provided per family's request.   Patient is currently on continuous dilaudid infusion at 1 mg/hr. He appears comfortable. He is not initiating breaths over the ventilator at this time.   Created space and opportunity for patient and family to express thoughts and feelings regarding patient's current medical situation. Emotional support provided.    When family stated they were ready to proceed, patient was extubated at approximately 20:02. A dose of IV  robinul was administered prior to extubation. Patient initially was apneic, but then began to have labored breathing. He required several bolus doses of dilaudid via the infusion over the next hour. Excessive respiratory and oral secretions noted. An additional dose of robinul was administered. Gentle oral and subglottic suctioning was performed and helped to alleviate the secretions.   Education and counseling was provided to family on the natural trajectory at EOL. Family expressed appreciation for PMT support through this difficult time.   Additional time spent in extensive communication with the care team and coordination of care due to complex family and psychosocial dynamics.    Length of Stay: 15  Physical Exam Vitals reviewed.  Constitutional:      General: He is not in acute distress.    Appearance: He is ill-appearing.  Pulmonary:     Comments: Excessive respiratory secretions Neurological:     Mental Status: He is unresponsive.            Vital Signs: BP 103/71    Pulse 82    Temp 98.3 F (36.8 C) (Axillary)    Resp 16    Ht 5\' 9"  (1.753 m)    Wt 95.6 kg    SpO2 99%    BMI 31.12 kg/m  SpO2: SpO2: 99 %  Palliative Assessment/Data: PPS 10%     Palliative Care  Assessment & Plan   Assessment: Left M1 occlusion s/p thrombectomy PEA arrest Anoxic brain injury Acute blood loss anemia Acute hypoxic respiratory failure Bilateral PE/DVT Non-oliguric AKI vs CKD  Recommendations/Plan: Full comfort care initiated Extubate to room air Continue dilaudid infusion Administer bolus dose of dilaudid every 15 minutes as needed for uncontrolled pain or dyspnea Continue scheduled ativan Continue glycopyrrolate (ROBINUL) for excessive secretions  Code Status: DNR  Prognosis:  Hours  Discharge Planning: Anticipated Hospital Death   Thank you for allowing the Palliative Medicine Team to assist in the care of this patient.   Total Time 130 minutes Prolonged Time Billed  yes       Greater than 50%  of this time was spent counseling and coordinating care related to the above assessment and plan.  , NP  Please contact Palliative Medicine Team phone at [phone] for questions and concerns.

## 2021-08-01 NOTE — Progress Notes (Addendum)
STROKE TEAM PROGRESS NOTE   INTERVAL HISTORY Patient is seen in his room with no family at the bedside.  He has been hemodynamically stable and his neurological exam is unchanged.  He remains ventilatory support for respiratory failure. Tachycardic, diaphoretic, and tachypnea overnight IV dilaudid at 67m/hr and ativan [id number]. palliative care team met with family yesterday and patient is now DNR and plan for comfort care today at 1900.  Vitals:   08/01/21 0723 08/01/21 0724 08/01/21 0800 08/01/21 0900  BP:   108/75 108/73  Pulse:   83 84  Resp:   18 16  Temp:   98.3 F (36.8 C)   TempSrc:   Axillary   SpO2: 100% 100% 97% 97%  Weight:      Height:       CBC:  Recent Labs  Lab 07/30/21 0846 07/31/21 0514  WBC 9.3 8.9  HGB 8.5* 8.3*  HCT 27.6* 26.4*  MCV 95.5 94.6  PLT 568* 557*    Basic Metabolic Panel:  Recent Labs  Lab 07/30/21 0846 07/31/21 0514  NA 137 139  K 4.6 4.5  CL 104 108  CO2 25 25  GLUCOSE 208* 188*  BUN 30* 33*  CREATININE 1.07 1.02  CALCIUM 8.9 9.2    Lipid Panel:  Recent Labs  Lab 07/31/21 0514  TRIG 83    HgbA1c:  No results for input(s): HGBA1C in the last 168 hours.  Urine Drug Screen: No results for input(s): LABOPIA, COCAINSCRNUR, LABBENZ, AMPHETMU, THCU, LABBARB in the last 168 hours.  Alcohol Level  No results for input(s): ETH in the last 168 hours.   IMAGING past 24 hours No results found.  PHYSICAL EXAM General - Well nourished, well developed, elderly male intubated off sedation.  Cardiovascular - Regular rate and rhythm.   Neuro - intubated off sedation, unresponsive.  Eyes are closed.  Pupils equal and nonreactive. Bilateral corneal reflex present.  Minimal cough with inline suctioning, minimal gag.  No spontaneous extremity movements.  Flickers BLE to noxious stimuli  ASSESSMENT/PLAN Mr. Jaime GOOSTREEs a [age].o. male with history of HTN, anemia, sleep apnea, stroke in 1998, HLD, eliquis use for DVT in RLE up until  12/18 presenting with right sided weakness and aphasia. Patient presented with aphasia and right hemiplegia and yesterday underwent emergent mechanical thrombectomy for left M1 occlusion with excellent  TICI 3 revascularization but unfortunately last night developed massive pulmonary embolism with low blood pressure developed bradycardic PEA arrest during pulmonary thrombectomy procedure requiring 30 minutes of CPR before  ROSC.  He has remained unresponsive and on full ventilatory support and vasopressor support. Developed massive PE and went to IR overnight 12/21 for a pulmonary arterial thrombectomy with a subsequent PEA arrest in IR.  He was treated with IV TNKase for systemic thrombolysis.  CCM initiated arctic sun for possible hypoxic brian injury and an overnight EEG. MRI after completion of hypothermia shows extensive involvement of the left cerebral hemisphere in the left MCA territory, left PCA territory and left watershed territories. Involvement of multiple vascular territories raises the possibility of an embolic process. Superimposed acute hypoxic/ischemic injury cannot be excluded.  Etiology for patient stroke involving pattern, source unclear but likely related to hypercoagulable state given history of DVT and recurrent PE.  However, with extensive stroke and anoxic brain injury on MRI, patient poor neuro exam without sedation, likely poor prognosis. Patient remains on Eliquis and ASA 81.  IV Dilaudid and IV push Ativan initiated overnight due to  tachycardia and diaphoresis.  Plan to compassionately extubate at 1900 today.  Stroke: embolic shower but largest at left MCA with L M1 occlusion s/p IR with Left M1 TICI3 but complicated by bilateral PE and PEA arrest s/p  IV TNK and unsuccessful thrombectomy Code Stroke- Hyperdense L MCA, chronic small-vessel ischemic changes of white matter. Old thalamic infarction  CTA head & neck-Occlusion of L MCA at mid M1. Mild atherosclerotic plaque.  CT  perfusion- Left MCA vascular territory totally 160m, no core infarct  MRI  Extensive acute/subacute infarcts within the supratentorial and infratentorial brain, greatest involvement of L MCA, L PCA, and L watershed territory. Embolic with hypoxic/ischemic injury 2D Echo EF 60-65%, mild dilation of ascending aorta, interatrial septum not well visualized Right groin ultrasound done and shows hematoma. LDL 71 HgbA1c 7.4 EEG no seizure activity VTE prophylaxis - SCDs Eliquis (apixaban) daily but off for colonoscopy prior to admission, now back on Eliquis Therapy recommendations:  pending Disposition:  pending, poor prognosis, repeat family meeting tomorrow  B/l PE s/p  IV TNK and unsuccessful thrombectomy PEA arrest during bilateral pulmonary artery thrombectomy Hypotension with bradycardia CPR 32mefore ROSC Initiation of Arctic Sun by CCM MRI showed evidence of anoxic brain injury Poor prognosis family meeting for GOFriscoiscussion on 07/31/2021 ended in the decision to initiate comfort care at 1900 today  Respiratory failure Intubated for cardiac arrest resuscitation On vent Not candidate for extubation Vent management per CCM  Hypotension Likely due to PEA arrest Was on levophed, now no longer requiring vasopressors Home BP medication- metoprolol 5013mHyperlipidemia Home meds:  Lovastatin 52m63mravastatin 20 mg ordered in hospital LDL 71, goal < 70 Continue statin at discharge  Diabetes type II uncontrolled HgbA1c 7.4, goal < 7.0 SSI CBG monitoring  Tobacco abuse Current smoker Smoking cessation counseling will be provided  Other Stroke Risk Factors Advanced Age >/= 65  64esity, Body mass index is 31.12 kg/m., BMI >/= 30 associated with increased stroke risk, recommend weight loss, diet and exercise as appropriate  Hx of stroke in 1998 - no details Hx of DVT on eliquis Obstructive sleep apnea, on CPAP at home  Other Active Problems Acute blood loss anemia  -stabilized- Hb 7.6-> 8.2.  -> 8.1-> 8.7-> 8.8-> 8.8-> 8.7 Pan CT showed no retroperitoneal hematoma.  Continue monitoring. AKI creatinine -resolved- 1.55-> 1.16-> 1.18-> 1.06- >1.09->0.99->1.06 Leukocytosis WBC -resolved - 11.4-> 7.7-> 6.8-> 8.7 -> 7.9-> 7.0-> 8.4 ->9.0->10.3   Hospital day # 15  Patient seen and examined by NP/APP with MD. MD to update note as needed.   DevoJanine OresP, FNP-BC [facility] Pager: [phone] personally obtained history,examined this patient, reviewed notes, independently viewed imaging studies, participated in medical decision making and plan of care.ROS completed by me personally and pertinent positives fully documented  I have made any additions or clarifications directly to the above note. Agree with note above.  Patient neurological exam and condition remains quite poor and chances of survival making meaningful improvement is negligible.  Palliative care team has met with family and they have agreed to DNR and do not want prolonged ventilatory support nursing home care which is unavoidable and leaning to maintain full comfort care later today. Discussed with Dr. DewaErin Fullingtical care medicine This patient is critically ill and at significant risk of neurological worsening, death and care requires constant monitoring of vital signs, hemodynamics,respiratory and cardiac monitoring, extensive review of multiple databases, frequent neurological assessment, discussion with family, other specialists and medical  decision making of high complexity.I have made any additions or clarifications directly to the above note.This critical care time does not reflect procedure time, or teaching time or supervisory time of PA/NP/Med Resident etc but could involve care discussion time.  I spent 30 minutes of neurocritical care time  in the care of  this patient.     Antony Contras, MD Medical Director [facility] Pager:  [phone] 08/01/2021 2:32 PM

## 2021-08-01 NOTE — Progress Notes (Signed)
I responded to a page from the nurse to provide spiritual support for the patient's family. I visited the patient's room with the several family members present. I provided spiritual care through pastoral presence, by reading scripture, and leading in prayer.    08/01/21 2100  Clinical Encounter Type  Visited With Patient and family together  Visit Type Spiritual support  Referral From Nurse  Consult/Referral To Chaplain  Spiritual Encounters  Spiritual Needs Prayer;Emotional    Chaplain Dr Redgie Grayer

## 2021-08-01 NOTE — Progress Notes (Signed)
NAME:  Jaime Whitaker, MRN:  [id number], DOB:  08-24-1940, LOS: 15 ADMISSION DATE:  07/09/2021, CONSULTATION DATE:  07/09/2021 REFERRING MD:  Corliss Skains, CHIEF COMPLAINT:  weakness   History of Present Illness:  80yM on eliquis for ?recently discovered DVT, IDA, PMR, OSA, CVA in 1994. History is obtained through chart review and discussion with the patient's wife. He was planning to undergo EGD for intervention on AVM felt to be potential culprit for IDA today and had held eliquis since 12/18. Today had R sided weakness, aphasia and gaze deviation.  He was also noted to be hypoxic in ED requiring NRB. Found to have L M1 occlusion now s/p aspiration with TICI 3 revascularization. Remained hypoxic after reversal of NMB, left intubated and arrived to neuro ICU on 100 of neo, 50 of propofol, saline infusion at 75 mL/hr  Pertinent  Medical History  DVT, IDA, PMR, OSA, CVA  Significant Hospital Events: Including procedures, antibiotic start and stop dates in addition to other pertinent events   07/25/2021 intubated, mechanical thrombectomy L M1 with TICI 3 revascularization 12/22 mechanical thrombectomy c/b 30 min in and out of PEA arrest, systemic TNK for PE. Made DNR after discussion with family. Normothermia protocol initiated 12/24 left femoral central line placed 12/25 GI bleeding but HDS and has known AVM continuing heparin, MR brain with extensive ischemic burden 12/28 weaning on pressure support 5/5 but mental status is poor 12/30 ongoing goals of care discussion.  Family wants to wait 1 more week with hope for recovery.  However no trach/PEG  Interim History / Subjective:   No acute events overnight.   Plan to transition to comfort this evening.  Objective   Blood pressure 115/71, pulse 86, temperature 99 F (37.2 C), temperature source Axillary, resp. rate 16, height 5\' 9"  (1.753 m), weight 95.6 kg, SpO2 100 %.    Vent Mode: PRVC FiO2 (%):  [30 %] 30 % Set Rate:  [16 bmp] 16 bmp Vt  Set:  [560 mL] 560 mL PEEP:  [5 cmH20] 5 cmH20 Pressure Support:  [5 cmH20-10 cmH20] 5 cmH20 Plateau Pressure:  [18 cmH20-19 cmH20] 19 cmH20   Intake/Output Summary (Last 24 hours) at 08/01/2021 0735 Last data filed at 08/01/2021 0700 Gross per 24 hour  Intake 2664.65 ml  Output 550 ml  Net 2114.65 ml   Filed Weights   07/29/21 0500 07/30/21 0443 07/31/21 0429  Weight: 98.2 kg 98.1 kg 95.6 kg    Examination: General: elderly male, no acute distress, intubated HENT: moist mucous membranes, sclera anicteric, PERRL Lungs: course breath sounds, no wheezing Cardiovascular: rrr, no murmurs Abdomen: soft, non-tender, BS+ Extremities: warm, no edema Neuro: unresponsive  Resolved Hospital Problem list   Non-oliguric AKI vs CKD  Assessment & Plan:  Left M1 occlusion s/p thrombectomy, systemic TNKase PEA arrest with anoxic brain injury S/p normothermia protocol Continue supportive care  Acute blood loss anemia, possible GI bleed Has known AVM, hemoglobin has remained stable Monitor CBC while he is on anticoagulation Continue Protonix  Acute hypoxic respiratory failure, bilateral PE, DVT History of OSA Continue vent support Heparin transitioned to Eliquis Continue brovana/yupelri nebs Unable to wean ventilator  PMR Continue daily prednisone  DM  Continue SSI, novolog  Plan to transition to comfort care this evening.   Best Practice (right click and "Reselect all SmartList Selections" daily)   Diet/type: tubefeeds DVT prophylaxis: DOAC GI prophylaxis: PPI Lines: N/A Foley:  N/A Code Status:  DNR Last date of multidisciplinary goals of care discussion [  Palliative care involved, appreciate assistance. Made DNR after discussion post arrest 12/22.  Family meeting 1/4. Patient to be made comfort care this evening. ]  Critical care time: n/a   Melody Comas, MD [facility] Office: [phone]   See Amion for personal pager PCCM on call pager  [phone] until 7pm. Please call Elink 7p-7a. [phone]

## 2021-08-28 NOTE — Death Summary Note (Addendum)
DEATH SUMMARY   Patient Details  Name: Jaime Whitaker MRN: [id number] DOB: 02-26-1941  Admission/Discharge Information   Admit Date:  08-01-2021  Date of Death: Date of Death: 2021-08-17  Time of Death: Time of Death: 0453  Length of Stay: 26-Nov-2022  Code Status:     Code Status Orders  (From admission, onward)           Start     Ordered   08/01/21 1847  DNR (Do not attempt resuscitation)  Continuous       Question Answer Comment  In the event of cardiac or respiratory ARREST Do not call a code blue   In the event of cardiac or respiratory ARREST Do not perform Intubation, CPR, defibrillation or ACLS   In the event of cardiac or respiratory ARREST Use medication by any route, position, wound care, and other measures to relive pain and suffering. May use oxygen, suction and manual treatment of airway obstruction as needed for comfort.      08/01/21 1847            Referring Physician: Richardean Chimera, MD    Reason(s) for Hospitalization     Stroke: embolic shower but largest at left MCA with L M1 occlusion s/p IR with Left M1 TICI3 but complicated by bilateral PE and PEA arrest s/p  IV TNK and unsuccessful thrombectomy Code Stroke- Hyperdense L MCA, chronic small-vessel ischemic changes of white matter. Old thalamic infarction  CTA head & neck-Occlusion of L MCA at mid M1. Mild atherosclerotic plaque.  CT perfusion- Left MCA vascular territory totally , no core infarct  MRI  Extensive acute/subacute infarcts within the supratentorial and infratentorial brain, greatest involvement of L MCA, L PCA, and L watershed territory. Embolic with hypoxic/ischemic injury 2D Echo EF 60-65%, mild dilation of ascending aorta, interatrial septum not well visualized Right groin ultrasound done and shows hematoma. LDL 71 HgbA1c 7.4 EEG no seizure activity VTE prophylaxis - SCDs Eliquis (apixaban) daily but off for colonoscopy prior to admission, now back on Eliquis Therapy  recommendations:  pending Disposition:  poor prognosis, DMR comfort care and extubated on 1900 on 08/01/21.   B/l PE s/p  IV TNK and unsuccessful thrombectomy PEA arrest during bilateral pulmonary artery thrombectomy Hypotension with bradycardia CPR 76m before ROSC Initiation of Arctic Sun by CCM MRI showed evidence of anoxic brain injury Poor prognosis    Respiratory failure Intubated for cardiac arrest resuscitation On vent Not candidate for extubation Vent management per CCM   Hypotension Likely due to PEA arrest Was on levophed, now no longer requiring vasopressors Home BP medication- metoprolol 50mg    Hyperlipidemia Home meds:  Lovastatin 20mg , Pravastatin 20 mg ordered in hospital LDL 71, goal < 70 Continued statin throughout hospitalization.   Diabetes type II poorly controlled HgbA1c 7.4, goal < 7.0 SSI CBG monitoring   Tobacco abuse Current smoker Smoking cessation counseling   Other Stroke Risk Factors Advanced Age >/= 71  Obesity, Body mass index is 31.12 kg/m., BMI >/= 30 associated with increased stroke risk, recommend weight loss, diet and exercise as appropriate  Hx of stroke in 11/27/96 - no details Hx of DVT on eliquis Obstructive sleep apnea, on CPAP at home   Other Active Problems Acute blood loss anemia -stabilized- Hb 7.6-> 8.2.  -> 8.1-> 8.7-> 8.8-> 8.8-> 8.7 Pan CT showed no retroperitoneal hematoma.  Continue monitoring. AKI creatinine -resolved- 1.55-> 1.16-> 1.18-> 1.06- >1.09->0.99->1.06 Leukocytosis WBC -resolved - 11.4-> 7.7-> 6.8-> 8.7 -> 7.9-> 7.0->  8.4 ->9.0->10.3  Diagnoses  Preliminary cause of death:  Extensive Embolic strokes involving L MCA, L PCA and left watershed territory.  Secondary Diagnoses (including complications and co-morbidities):  Saddle Pulmonary Embolism s/p tNKASE and unsuccessful thrombectomy. PEA arrest with 30 mins of CPR. Hypoxic brain injury  Principal Problem:   Stroke (cerebrum) (HCC) Active  Problems:   Middle cerebral artery embolism, left   Acute respiratory failure with hypoxia and hypercapnia (HCC)   Pulmonary embolus (HCC)   Obstructive cardiovascular shock [facility])    Brief Hospital Course   [name] is a 81 y.o. male with history of HTN, anemia, sleep apnea, stroke in 1998, HLD, eliquis use for DVT in RLE up until 12/18 presenting with right sided weakness and aphasia. Patient presented with aphasia and right hemiplegia and underwent emergent mechanical thrombectomy for left M1 occlusion with excellent  TICI 3 revascularization but unfortunately developed massive pulmonary embolism with low blood pressure developed bradycardic PEA arrest during pulmonary thrombectomy procedure requiring 30 minutes of CPR before  ROSC.  He remained unresponsive and on full ventilatory support and vasopressor support. Developed massive PE and went to IR overnight 12/21 for a pulmonary arterial thrombectomy with a subsequent PEA arrest in IR.  He was treated with IV TNKase for systemic thrombolysis.  CCM initiated arctic sun for possible hypoxic brian injury and an overnight EEG. MRI after completion of hypothermia shows extensive involvement of the left cerebral hemisphere in the left MCA territory, left PCA territory and left watershed territories. Involvement of multiple vascular territories raises the possibility of an embolic process. Superimposed acute hypoxic/ischemic injury cannot be excluded.  Etiology for patient stroke involving pattern, source unclear but likely related to hypercoagulable state given history of DVT and recurrent PE.  However, with extensive stroke and anoxic brain injury on MRI, patient poor neuro exam without sedation, likely poor prognosis. Patient remains on Eliquis and ASA 81.  After extensive discussion with family, he was transitioned to comfort care and compassionately extubated on August 21, 2021 and passed away at 0453 AM on 22-Aug-2021.    Erick Blinks Aug 22, 2021,  5:18 AM

## 2021-08-28 DEATH — deceased
